# Patient Record
Sex: Female | Born: 1980 | Race: White | Hispanic: No | Marital: Married | Smoking: Former smoker
Health system: Southern US, Community
[De-identification: ages and names within clinical notes are randomized; demographics above are authoritative.]

## PROBLEM LIST (undated history)

## (undated) DIAGNOSIS — E119 Type 2 diabetes mellitus without complications: Secondary | ICD-10-CM

## (undated) DIAGNOSIS — S86019A Strain of unspecified Achilles tendon, initial encounter: Secondary | ICD-10-CM

## (undated) DIAGNOSIS — Z6841 Body Mass Index (BMI) 40.0 and over, adult: Secondary | ICD-10-CM

## (undated) DIAGNOSIS — E782 Mixed hyperlipidemia: Secondary | ICD-10-CM

## (undated) DIAGNOSIS — F32A Depression, unspecified: Secondary | ICD-10-CM

## (undated) DIAGNOSIS — K219 Gastro-esophageal reflux disease without esophagitis: Secondary | ICD-10-CM

## (undated) DIAGNOSIS — Z8759 Personal history of other complications of pregnancy, childbirth and the puerperium: Secondary | ICD-10-CM

## (undated) DIAGNOSIS — F419 Anxiety disorder, unspecified: Secondary | ICD-10-CM

## (undated) DIAGNOSIS — F329 Major depressive disorder, single episode, unspecified: Secondary | ICD-10-CM

## (undated) HISTORY — DX: Depression, unspecified: F32.A

## (undated) HISTORY — DX: Type 2 diabetes mellitus without complications: E11.9

## (undated) HISTORY — DX: Gastro-esophageal reflux disease without esophagitis: K21.9

## (undated) HISTORY — PX: WISDOM TOOTH EXTRACTION: SHX21

## (undated) HISTORY — DX: Personal history of other complications of pregnancy, childbirth and the puerperium: Z87.59

---
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
## 2003-12-22 HISTORY — PX: CERVICAL BIOPSY  W/ LOOP ELECTRODE EXCISION: SUR135

## 2009-01-21 HISTORY — PX: OTHER SURGICAL HISTORY: SHX169

## 2013-11-15 DIAGNOSIS — R87612 Low grade squamous intraepithelial lesion on cytologic smear of cervix (LGSIL): Secondary | ICD-10-CM | POA: Insufficient documentation

## 2013-11-15 HISTORY — DX: Low grade squamous intraepithelial lesion on cytologic smear of cervix (LGSIL): R87.612

## 2018-11-16 ENCOUNTER — Other Ambulatory Visit: Payer: Self-pay

## 2018-11-18 ENCOUNTER — Encounter: Payer: Self-pay | Admitting: Internal Medicine

## 2018-11-18 ENCOUNTER — Ambulatory Visit (INDEPENDENT_AMBULATORY_CARE_PROVIDER_SITE_OTHER): Payer: No Typology Code available for payment source | Admitting: Internal Medicine

## 2018-11-18 ENCOUNTER — Other Ambulatory Visit: Payer: Self-pay

## 2018-11-18 VITALS — BP 124/86 | HR 101 | Temp 97.6°F | Resp 16 | Ht 62.0 in | Wt 278.0 lb

## 2018-11-18 DIAGNOSIS — F339 Major depressive disorder, recurrent, unspecified: Secondary | ICD-10-CM | POA: Diagnosis not present

## 2018-11-18 DIAGNOSIS — R635 Abnormal weight gain: Secondary | ICD-10-CM

## 2018-11-18 DIAGNOSIS — Z23 Encounter for immunization: Secondary | ICD-10-CM

## 2018-11-18 DIAGNOSIS — R1310 Dysphagia, unspecified: Secondary | ICD-10-CM

## 2018-11-18 DIAGNOSIS — F5102 Adjustment insomnia: Secondary | ICD-10-CM

## 2018-11-18 DIAGNOSIS — E119 Type 2 diabetes mellitus without complications: Secondary | ICD-10-CM

## 2018-11-18 DIAGNOSIS — R1319 Other dysphagia: Secondary | ICD-10-CM

## 2018-11-18 DIAGNOSIS — R5383 Other fatigue: Secondary | ICD-10-CM

## 2018-11-18 MED ORDER — BUPROPION HCL ER (XL) 150 MG PO TB24: 150.0000 mg | ORAL_TABLET | Freq: Every day | ORAL | 2 refills | Status: DC

## 2018-11-18 MED ORDER — TRAZODONE HCL 50 MG PO TABS: 25.0000 mg | ORAL_TABLET | Freq: Every evening | ORAL | 3 refills | Status: DC | PRN

## 2018-11-18 MED ORDER — OMEPRAZOLE 20 MG PO CPDR: 20.0000 mg | DELAYED_RELEASE_CAPSULE | Freq: Every day | ORAL | 3 refills | Status: DC

## 2018-11-18 NOTE — Progress Notes (Addendum)
Subjective:  Patient ID: Patricia Mcbride, female    DOB: 08-22-1980  Age: 38 y.o. MRN: [id number]  CC: The primary encounter diagnosis was Weight gain. Diagnoses of Fatigue, unspecified type, Esophageal dysphagia, Need for immunization against influenza, Major depressive disorder, recurrent episode with anxious distress (HCC), Insomnia due to psychological stress, Morbid obesity (HCC), and Diabetes mellitus without complication (HCC) were also pertinent to this visit.  HPI Patricia Mcbride presents for establishment of care.  Positive depression screen:  Overating, anhedonia, not sleeping well.  Excessive Fatigue. Not suicidal .  Major life changes taken on simultaneously have created stress and she is having  trouble concentrating   PAPs done annually  and have been normal for ten years Jan 2019   No prior Mammogram   2) obesity: Smoothie made with oj and light yogurt pbj and chps for  Lunch  Snacks on nuts  10-15 lb weight loss at best with gym attendance    3)  Dysphagia  For the  past 6 - 12 months .  History of heart burn treated with zantac for 2 years   History Patricia Mcbride has a past medical history of Depression, GERD (gastroesophageal reflux disease), and Jaundice of newborn.   She has no past surgical history on file.   Her family history includes Asthma in her maternal grandmother and mother; Cancer in her father; Depression in her father, mother, sister, and sister; Diabetes in her father, maternal grandmother, and mother; Hearing loss in her maternal grandmother and sister; Heart disease in her maternal grandmother, paternal grandfather, and paternal grandmother; Hyperlipidemia in her father, maternal grandmother, mother, paternal grandfather, and paternal grandmother; Hypertension in her father, maternal grandmother, mother, paternal grandfather, paternal grandmother, and sister; Kidney disease in her maternal grandmother, paternal grandfather, and paternal grandmother;  Miscarriages / [place] in her sister.She reports that she has quit smoking. She has never used smokeless tobacco. She reports current alcohol use. She reports that she does not use drugs.  No outpatient medications prior to visit.   No facility-administered medications prior to visit.     Review of Systems:  Patient denies headache, fevers, malaise, unintentional weight loss, skin rash, eye pain, sinus congestion and sinus pain, sore throat, dysphagia,  hemoptysis , cough, dyspnea, wheezing, chest pain, palpitations, orthopnea, edema, abdominal pain, nausea, melena, diarrhea, constipation, flank pain, dysuria, hematuria, urinary  Frequency, nocturia, numbness, tingling, seizures,  Focal weakness, Loss of consciousness,  Tremor, insomnia, depression, anxiety, and suicidal ideation.     Objective:  BP 124/86 (BP Location: Left Arm, Patient Position: Sitting, Cuff Size: Large)   Pulse (!) 101   Temp 97.6 F (36.4 C) (Temporal)   Resp 16   Ht 5\' 2"  (1.575 m)   Wt 278 lb (126.1 kg)   SpO2 97%   BMI 50.85 kg/m   Physical Exam:  General appearance: alert, cooperative and appears stated age Ears: normal TM's and external ear canals both ears Throat: lips, mucosa, and tongue normal; teeth and gums normal Neck: no adenopathy, no carotid bruit, supple, symmetrical, trachea midline and thyroid not enlarged, symmetric, no tenderness/mass/nodules Back: symmetric, no curvature. ROM normal. No CVA tenderness. Lungs: clear to auscultation bilaterally Heart: regular rate and rhythm, S1, S2 normal, no murmur, click, rub or gallop Abdomen: soft, non-tender; bowel sounds normal; no masses,  no organomegaly Pulses: 2+ and symmetric Skin: Skin color, texture, turgor normal. No rashes or lesions Lymph nodes: Cervical, supraclavicular, and axillary nodes normal. Psych: affect normal, extroverted .makes good eye contact. No  fidgeting,  Smiles easily.  Denies suicidal thoughts    Assessment & Plan:    Problem List Items Addressed This Visit      Unprioritized   Diabetes mellitus without complication (HCC)    Recent fasting  glucose is elevated and  diagnostic of diabetes.  Based on her  A1c,   SHE DOES NOT need medications at this time.  She was encouraged to schedule a follow up visit to discuss diagnosis and lifestyle changes including low glycemic index diet , weight loss (goal BMI < 30) , participate regularly in aerobic  Exercise.      Dysphagia    Secondary to longstanding GERD.  Barium swallow and GI referral advised.  Omeprazole prescribed.       Relevant Orders   DG ESOPHAGUS W SINGLE CM (SOL OR THIN BA)   Ambulatory referral to Gastroenterology   Major depressive disorder, recurrent episode with anxious distress (HCC)    Trial of wellbutrin , with trazodone added for insomnia follow up 2-3 weeks       Relevant Medications   buPROPion (WELLBUTRIN XL) 150 MG 24 hr tablet   traZODone (DESYREL) 50 MG tablet   Insomnia due to psychological stress    Adding trazodone       Morbid obesity (HCC)    I have addressed  BMI and recommended a low glycemic index diet utilizing smaller more frequent meals to increase metabolism.  I have also recommended that patient start exercising with a goal of 30 minutes of aerobic exercise a minimum of 5 days per week. Screening for lipid disorders, thyroid and diabetes to be done today.         Other Visit Diagnoses    Weight gain    -  Primary   Relevant Orders   TSH (Completed)   Comprehensive metabolic panel (Completed)   Hemoglobin A1c (Completed)   Lipid panel (Completed)   Fatigue, unspecified type       Relevant Orders   CBC with Differential/Platelet (Completed)   Need for immunization against influenza       Relevant Orders   Flu Vaccine QUAD 36+ mos IM (Completed)      I am having Patricia Mcbride start on buPROPion, traZODone, and omeprazole.  Meds ordered this encounter  Medications  . buPROPion (WELLBUTRIN XL)  150 MG 24 hr tablet    Sig: Take 1 tablet (150 mg total) by mouth daily.    Dispense:  30 tablet    Refill:  2  . traZODone (DESYREL) 50 MG tablet    Sig: Take 0.5-1 tablets (25-50 mg total) by mouth at bedtime as needed for sleep.    Dispense:  30 tablet    Refill:  3  . omeprazole (PRILOSEC) 20 MG capsule    Sig: Take 1 capsule (20 mg total) by mouth daily.    Dispense:  30 capsule    Refill:  3    There are no discontinued medications.  Follow-up: Return in about 4 weeks (around 12/16/2018).   Sherlene Shams, MD

## 2018-11-18 NOTE — Patient Instructions (Signed)
Wellbutrin once daily for the depression.  Take with breakfast  Trazodone at bedtime start with 1/2 tablet  Omeprazole once daily on an [place]:  1) SWALLOW EVALUATION 2)  Gi REFERRAL FOR AN ENDOSCOPY    Dysphagia  Dysphagia is trouble swallowing. This condition occurs when solids and liquids stick in a person's throat on the way down to the stomach, or when food takes longer to get to the stomach. You may have problems swallowing food, liquids, or both. You may also have pain while trying to swallow. It may take you more time and effort to swallow something. What are the causes? This condition is caused by:  Problems with the muscles. They may make it difficult for you to move food and liquids through the tube that connects your mouth to your stomach (esophagus). You may have ulcers, scar tissue, or inflammation that blocks the normal passage of food and liquids. Causes of these problems include: ? Acid reflux from your stomach into your esophagus (gastroesophageal reflux). ? Infections. ? Radiation treatment for cancer. ? Medicines taken without enough fluids to wash them down into your stomach.  Nerve problems. These prevent signals from being sent to the muscles of your esophagus to squeeze (contract) and move what you swallow down to your stomach.  Globus pharyngeus. This is a common problem that involves feeling like something is stuck in the throat or a sense of trouble with swallowing even though nothing is wrong with the swallowing passages.  Stroke. This can affect the nerves and make it difficult to swallow.  Certain conditions, such as cerebral palsy or Parkinson disease. What are the signs or symptoms? Common symptoms of this condition include:  A feeling that solids or liquids are stuck in your throat on the way down to the stomach.  Food taking too long to get to the stomach. Other symptoms include:  Food moving back from  your stomach to your mouth (regurgitation).  Noises coming from your throat.  Chest discomfort with swallowing.  A feeling of fullness when swallowing.  Drooling, especially when the throat is blocked.  Pain while swallowing.  Heartburn.  Coughing or gagging while trying to swallow. How is this diagnosed? This condition is diagnosed by:  Barium X-ray. In this test, you swallow a white substance (contrast medium)that sticks to the inside of your esophagus. X-ray images are then taken.  Endoscopy. In this test, a flexible telescope is inserted down your throat to look at your esophagus and your stomach.  CT scans and MRI. How is this treated? Treatment for dysphagia depends on the cause of the condition:  If the dysphagia is caused by acid reflux or infection, medicines may be used. They may include antibiotics and heartburn medicines.  If the dysphagia is caused by problems with your muscles, swallowing therapy may be used to help you strengthen your swallowing muscles. You may have to do specific exercises to strengthen the muscles or stretch them.  If the dysphagia is caused by a blockage or mass, procedures to remove the blockage may be done. You may need surgery and a feeding tube. You may need to make diet changes. Ask your health care provider for specific instructions. Follow these instructions at home: Eating and drinking  Try to eat soft food that is easier to swallow.  Follow any diet changes as told by your health care provider.  Cut your food into small pieces and eat slowly.  Eat and drink only  when you are sitting upright.  Do not drink alcohol or caffeine. If you need help quitting, ask your health care provider. General instructions  Check your weight every day to make sure you are not losing weight.  Take over-the-counter and prescription medicines only as told by your health care provider.  If you were prescribed an antibiotic medicine, take it as  told by your health care provider. Do not stop taking the antibiotic even if you start to feel better.  Do not use any products that contain nicotine or tobacco, such as cigarettes and e-cigarettes. If you need help quitting, ask your health care provider.  Keep all follow-up visits as told by your health care provider. This is important. Contact a health care provider if:  You lose weight because you cannot swallow.  You cough when you drink liquids (aspiration).  You cough up partially digested food. Get help right away if:  You cannot swallow your saliva.  You have shortness of breath or a fever, or both.  You have a hoarse voice and also have trouble swallowing. Summary  Dysphagia is trouble swallowing. This condition occurs when solids and liquids stick in a person's throat on the way down to the stomach, or when food takes longer to get to the stomach.  Dysphagia has many possible causes and symptoms.  Treatment for dysphagia depends on the cause of the condition. This information is not intended to replace advice given to you by your health care provider. Make sure you discuss any questions you have with your health care provider. Document Released: 01/05/2000 Document Revised: 12/20/2016 Document Reviewed: 12/28/2015 Elsevier Patient Education  2020 [employer].

## 2018-11-19 DIAGNOSIS — R1310 Dysphagia, unspecified: Secondary | ICD-10-CM | POA: Insufficient documentation

## 2018-11-19 DIAGNOSIS — F339 Major depressive disorder, recurrent, unspecified: Secondary | ICD-10-CM | POA: Insufficient documentation

## 2018-11-19 DIAGNOSIS — F5102 Adjustment insomnia: Secondary | ICD-10-CM | POA: Insufficient documentation

## 2018-11-19 LAB — CBC WITH DIFFERENTIAL/PLATELET
Basophils Absolute: 0.1 10*3/uL (ref 0.0–0.1)
Basophils Relative: 0.6 % (ref 0.0–3.0)
Eosinophils Absolute: 0.6 10*3/uL (ref 0.0–0.7)
Eosinophils Relative: 4.6 % (ref 0.0–5.0)
HCT: 40.6 % (ref 36.0–46.0)
Hemoglobin: 13.4 g/dL (ref 12.0–15.0)
Lymphocytes Relative: 29.1 % (ref 12.0–46.0)
Lymphs Abs: 3.7 10*3/uL (ref 0.7–4.0)
MCHC: 33 g/dL (ref 30.0–36.0)
MCV: 89.6 fl (ref 78.0–100.0)
Monocytes Absolute: 0.9 10*3/uL (ref 0.1–1.0)
Monocytes Relative: 7 % (ref 3.0–12.0)
Neutro Abs: 7.4 10*3/uL (ref 1.4–7.7)
Neutrophils Relative %: 58.7 % (ref 43.0–77.0)
Platelets: 355 10*3/uL (ref 150.0–400.0)
RBC: 4.53 Mil/uL (ref 3.87–5.11)
RDW: 13.3 % (ref 11.5–15.5)
WBC: 12.7 10*3/uL — ABNORMAL HIGH (ref 4.0–10.5)

## 2018-11-19 LAB — COMPREHENSIVE METABOLIC PANEL
ALT: 17 U/L (ref 0–35)
AST: 13 U/L (ref 0–37)
Albumin: 4.4 g/dL (ref 3.5–5.2)
Alkaline Phosphatase: 92 U/L (ref 39–117)
BUN: 9 mg/dL (ref 6–23)
CO2: 30 mEq/L (ref 19–32)
Calcium: 9.8 mg/dL (ref 8.4–10.5)
Chloride: 101 mEq/L (ref 96–112)
Creatinine, Ser: 0.78 mg/dL (ref 0.40–1.20)
GFR: 83.63 mL/min (ref >60.00)
Glucose, Bld: 131 mg/dL — ABNORMAL HIGH (ref 70–99)
Potassium: 4.1 mEq/L (ref 3.5–5.1)
Sodium: 137 mEq/L (ref 135–145)
Total Bilirubin: 0.4 mg/dL (ref 0.2–1.2)
Total Protein: 7.4 g/dL (ref 6.0–8.3)

## 2018-11-19 LAB — LDL CHOLESTEROL, DIRECT: Direct LDL: 141 mg/dL

## 2018-11-19 LAB — HEMOGLOBIN A1C: Hgb A1c MFr Bld: 6.5 % (ref 4.6–6.5)

## 2018-11-19 LAB — LIPID PANEL
Cholesterol: 196 mg/dL (ref 0–200)
HDL: 31.9 mg/dL — ABNORMAL LOW (ref >39.00)
NonHDL: 163.6
Total CHOL/HDL Ratio: 6
Triglycerides: 201 mg/dL — ABNORMAL HIGH (ref 0.0–149.0)
VLDL: 40.2 mg/dL — ABNORMAL HIGH (ref 0.0–40.0)

## 2018-11-19 NOTE — Assessment & Plan Note (Signed)
Secondary to longstanding GERD.  Barium swallow and GI referral advised.  Omeprazole prescribed.

## 2018-11-19 NOTE — Assessment & Plan Note (Signed)
Trial of wellbutrin , with trazodone added for insomnia follow up 2-3 weeks

## 2018-11-19 NOTE — Assessment & Plan Note (Signed)
Adding trazodone

## 2018-11-19 NOTE — Assessment & Plan Note (Signed)
I have addressed  BMI and recommended a low glycemic index diet utilizing smaller more frequent meals to increase metabolism.  I have also recommended that patient start exercising with a goal of 30 minutes of aerobic exercise a minimum of 5 days per week. Screening for lipid disorders, thyroid and diabetes to be done today.   

## 2018-11-20 LAB — TSH: TSH: 1.43 u[IU]/mL (ref 0.35–4.50)

## 2018-11-22 DIAGNOSIS — E119 Type 2 diabetes mellitus without complications: Secondary | ICD-10-CM | POA: Insufficient documentation

## 2018-11-22 NOTE — Assessment & Plan Note (Addendum)
her fasting  glucose is elevated and  diagnostic of diabetes.  Based on her  A1c,   SHE DOES NOT need medications at this time.  She was encouraged to schedule a follow up visit to discuss diagnosis and lifestyle changes including low glycemic index diet , weight loss (goal BMI < 30) , participate regularly in aerobic  Exercise.

## 2018-11-27 ENCOUNTER — Telehealth: Payer: Self-pay | Admitting: Internal Medicine

## 2018-11-27 NOTE — Telephone Encounter (Signed)
It needs to be done BEFORE she sees GI .  Thank you

## 2018-11-27 NOTE — Telephone Encounter (Signed)
I called pt to schedule the DG esophagus, pt is scheduled to see the GI in December. Do you want pt to have the DG done before the appt? Or can she wait until she sees the GI? Please advise and Thank you!

## 2018-12-04 ENCOUNTER — Other Ambulatory Visit: Payer: Self-pay | Admitting: Internal Medicine

## 2018-12-04 ENCOUNTER — Ambulatory Visit
Admission: RE | Admit: 2018-12-04 | Discharge: 2018-12-04 | Disposition: A | Discharge status: Home / Self Care | Payer: No Typology Code available for payment source | Source: Ambulatory Visit | Attending: Internal Medicine | Admitting: Internal Medicine

## 2018-12-04 ENCOUNTER — Other Ambulatory Visit: Payer: Self-pay

## 2018-12-04 DIAGNOSIS — R1310 Dysphagia, unspecified: Secondary | ICD-10-CM

## 2018-12-04 DIAGNOSIS — R1319 Other dysphagia: Secondary | ICD-10-CM

## 2018-12-06 ENCOUNTER — Telehealth: Payer: Self-pay | Admitting: Internal Medicine

## 2018-12-06 DIAGNOSIS — R1319 Other dysphagia: Secondary | ICD-10-CM

## 2018-12-06 DIAGNOSIS — R1310 Dysphagia, unspecified: Secondary | ICD-10-CM

## 2018-12-06 NOTE — Telephone Encounter (Signed)
Her Barium swallow showed normal swallow function, no obvious stricture or mechanical problems.  I would Proceed with GI evaluation

## 2018-12-07 NOTE — Telephone Encounter (Signed)
Spoke with pt to let her know of the results and pt is okay with going forward with the GI evaluation.

## 2018-12-07 NOTE — Telephone Encounter (Signed)
Referral in process

## 2018-12-14 DIAGNOSIS — F32A Depression, unspecified: Secondary | ICD-10-CM | POA: Insufficient documentation

## 2018-12-31 ENCOUNTER — Other Ambulatory Visit: Payer: Self-pay

## 2018-12-31 ENCOUNTER — Encounter: Payer: Self-pay | Admitting: Gastroenterology

## 2018-12-31 ENCOUNTER — Ambulatory Visit: Payer: No Typology Code available for payment source | Admitting: Gastroenterology

## 2018-12-31 ENCOUNTER — Ambulatory Visit (INDEPENDENT_AMBULATORY_CARE_PROVIDER_SITE_OTHER): Payer: No Typology Code available for payment source | Admitting: Gastroenterology

## 2018-12-31 VITALS — BP 121/78 | HR 91 | Temp 98.8°F | Ht 62.0 in | Wt 272.2 lb

## 2018-12-31 DIAGNOSIS — R1310 Dysphagia, unspecified: Secondary | ICD-10-CM | POA: Diagnosis not present

## 2018-12-31 MED ORDER — PANTOPRAZOLE SODIUM 40 MG PO TBEC: 40.0000 mg | DELAYED_RELEASE_TABLET | Freq: Every day | ORAL | 3 refills | Status: DC

## 2018-12-31 NOTE — Progress Notes (Signed)
  Gastroenterology Consultation  Referring Provider:     Tullo, Teresa L, MD Primary Care Physician:  Tullo, Teresa L, MD Primary Gastroenterologist:  Dr. Betsie Peckman     Reason for Consultation:     Dysphagia        HPI:   Patricia Mcbride is a 38 y.o. y/o female referred for consultation & management of dysphagia by Dr. Tullo, Teresa L, MD.  This patient comes in with a report of dysphagia.  The patient states that her dysphagia is mostly to solids.  She has been having problems with bread, beef, chicken and pork.  She denies any unexplained weight loss.  She does state that this has been going on for some time and has not had any rectal bleeding or change in bowel habits.  The patient denies any family history of any upper GI cancers.  She reports that she was started on omeprazole and takes it usually an hour before she eats and has had some relief but continues to have dysphagia.  Past Medical History:  Diagnosis Date  . Depression   . GERD (gastroesophageal reflux disease)   . Jaundice of newborn     History reviewed. No pertinent surgical history.  Prior to Admission medications   Medication Sig Start Date End Date Taking? Authorizing Provider  buPROPion (WELLBUTRIN XL) 150 MG 24 hr tablet Take 1 tablet (150 mg total) by mouth daily. 11/18/18  Yes Tullo, Teresa L, MD  omeprazole (PRILOSEC) 20 MG capsule Take 1 capsule (20 mg total) by mouth daily. 11/18/18  Yes Tullo, Teresa L, MD  traZODone (DESYREL) 50 MG tablet Take 0.5-1 tablets (25-50 mg total) by mouth at bedtime as needed for sleep. Patient not taking: Reported on 12/31/2018 11/18/18   Tullo, Teresa L, MD    Family History  Problem Relation Age of Onset  . Depression Mother   . Hypertension Mother   . Hyperlipidemia Mother   . Diabetes Mother   . Asthma Mother   . Depression Father   . Hypertension Father   . Cancer Father   . Diabetes Father   . Hyperlipidemia Father   . Depression Sister   . Hearing loss Sister     . Miscarriages / Stillbirths Sister   . Depression Sister   . Hypertension Sister   . Asthma Maternal Grandmother   . Diabetes Maternal Grandmother   . Hearing loss Maternal Grandmother   . Heart disease Maternal Grandmother   . Hyperlipidemia Maternal Grandmother   . Hypertension Maternal Grandmother   . Kidney disease Maternal Grandmother   . Kidney disease Paternal Grandmother   . Hypertension Paternal Grandmother   . Hyperlipidemia Paternal Grandmother   . Heart disease Paternal Grandmother   . Heart disease Paternal Grandfather   . Hyperlipidemia Paternal Grandfather   . Hypertension Paternal Grandfather   . Kidney disease Paternal Grandfather      Social History   Tobacco Use  . Smoking status: Former Smoker  . Smokeless tobacco: Never Used  Substance Use Topics  . Alcohol use: Yes  . Drug use: Never    Allergies as of 12/31/2018 - Review Complete 12/31/2018  Allergen Reaction Noted  . Morphine Nausea Only 07/15/2011    Review of Systems:    All systems reviewed and negative except where noted in HPI.   Physical Exam:  BP 121/78   Pulse 91   Temp 98.8 F (37.1 C) (Oral)   Ht 5' 2" (1.575 m)   Wt 272 lb   3.2 oz (123.5 kg)   BMI 49.79 kg/m  No LMP recorded. General:   Alert,  Well-developed, well-nourished, pleasant and cooperative in NAD Head:  Normocephalic and atraumatic. Eyes:  Sclera clear, no icterus.   Conjunctiva pink. Ears:  Normal auditory acuity. Neck:  Supple; no masses or thyromegaly. Lungs:  Respirations even and unlabored.  Clear throughout to auscultation.   No wheezes, crackles, or rhonchi. No acute distress. Heart:  Regular rate and rhythm; no murmurs, clicks, rubs, or gallops. Abdomen:  Normal bowel sounds.  No bruits.  Soft, non-tender and non-distended without masses, hepatosplenomegaly or hernias noted.  No guarding or rebound tenderness.  Negative Carnett sign.   Rectal:  Deferred.  Msk:  Symmetrical without gross deformities.   Good, equal movement & strength bilaterally. Pulses:  Normal pulses noted. Extremities:  No clubbing or edema.  No cyanosis. Neurologic:  Alert and oriented x3;  grossly normal neurologically. Skin:  Intact without significant lesions or rashes.  No jaundice. Lymph Nodes:  No significant cervical adenopathy. Psych:  Alert and cooperative. Normal mood and affect.  Imaging Studies: DG ESOPHAGUS W DOUBLE CM (HD)  Result Date: 12/04/2018 CLINICAL DATA:  Dysphagia EXAM: ESOPHOGRAM / BARIUM SWALLOW / BARIUM TABLET STUDY TECHNIQUE: Combined double contrast and single contrast examination performed using effervescent crystals, thick barium liquid, and thin barium liquid. The patient was observed with fluoroscopy swallowing a 13 mm barium sulphate tablet. FLUOROSCOPY TIME:  Fluoroscopy Time:  1:06 Number of Acquired Spot Images: 45 COMPARISON:  None. FINDINGS: Normal oropharyngeal phase of swallow. No evidence of penetration or aspiration. Normal contour and caliber of the esophagus. The gastroesophageal junction is patent. A swallowed 13 mm barium tablet passes readily. No spontaneous or provoked gastroesophageal reflux. Normal partial double contrast appearance of the stomach and proximal small bowel. IMPRESSION: Normal double contrast barium swallow examination. Electronically Signed   By: Alex  Bibbey M.D.   On: 12/04/2018 10:04    Assessment and Plan:   Patricia Mcbride is a 38 y.o. y/o female who comes in today with a history of dysphagia.  The patient had a esophagus barium swallow without any strictures or narrowing seen.  It was reported to be normal.  The patient has done better with a PPI but is not back to her normal self.  The patient will be set up for an upper endoscopy for possible eosinophilic esophagitis.  The patient has been reassured that since this has been going on for so long in the upper GI barium study did not show any masses or cancers that this is unlikely an abnormal growth or  cancer causing her symptoms.  The patient has been explained the plan and agrees with it.    [name], MD. FACG    Note: This dictation was prepared with Dragon dictation along with smaller phrase technology. Any transcriptional errors that result from this process are unintentional.   

## 2018-12-31 NOTE — H&P (View-Only) (Signed)
Gastroenterology Consultation  Referring Provider:     Crecencio Mc, MD Primary Care Physician:  Crecencio Mc, MD Primary Gastroenterologist:  Dr. Allen Norris     Reason for Consultation:     Dysphagia        HPI:   Patricia Mcbride is a 38 y.o. y/o female referred for consultation & management of dysphagia by Dr. Derrel Nip, Aris Everts, MD.  This patient comes in with a report of dysphagia.  The patient states that her dysphagia is mostly to solids.  She has been having problems with bread, beef, chicken and pork.  She denies any unexplained weight loss.  She does state that this has been going on for some time and has not had any rectal bleeding or change in bowel habits.  The patient denies any family history of any upper GI cancers.  She reports that she was started on omeprazole and takes it usually an hour before she eats and has had some relief but continues to have dysphagia.  Past Medical History:  Diagnosis Date  . Depression   . GERD (gastroesophageal reflux disease)   . Jaundice of newborn     History reviewed. No pertinent surgical history.  Prior to Admission medications   Medication Sig Start Date End Date Taking? Authorizing Provider  buPROPion (WELLBUTRIN XL) 150 MG 24 hr tablet Take 1 tablet (150 mg total) by mouth daily. 11/18/18  Yes Crecencio Mc, MD  omeprazole (PRILOSEC) 20 MG capsule Take 1 capsule (20 mg total) by mouth daily. 11/18/18  Yes Crecencio Mc, MD  traZODone (DESYREL) 50 MG tablet Take 0.5-1 tablets (25-50 mg total) by mouth at bedtime as needed for sleep. Patient not taking: Reported on 12/31/2018 11/18/18   Crecencio Mc, MD    Family History  Problem Relation Age of Onset  . Depression Mother   . Hypertension Mother   . Hyperlipidemia Mother   . Diabetes Mother   . Asthma Mother   . Depression Father   . Hypertension Father   . Cancer Father   . Diabetes Father   . Hyperlipidemia Father   . Depression Sister   . Hearing loss Sister     . Miscarriages / Stillbirths Sister   . Depression Sister   . Hypertension Sister   . Asthma Maternal Grandmother   . Diabetes Maternal Grandmother   . Hearing loss Maternal Grandmother   . Heart disease Maternal Grandmother   . Hyperlipidemia Maternal Grandmother   . Hypertension Maternal Grandmother   . Kidney disease Maternal Grandmother   . Kidney disease Paternal Grandmother   . Hypertension Paternal Grandmother   . Hyperlipidemia Paternal Grandmother   . Heart disease Paternal Grandmother   . Heart disease Paternal Grandfather   . Hyperlipidemia Paternal Grandfather   . Hypertension Paternal Grandfather   . Kidney disease Paternal Grandfather      Social History   Tobacco Use  . Smoking status: Former Research scientist (life sciences)  . Smokeless tobacco: Never Used  Substance Use Topics  . Alcohol use: Yes  . Drug use: Never    Allergies as of 12/31/2018 - Review Complete 12/31/2018  Allergen Reaction Noted  . Morphine Nausea Only 07/15/2011    Review of Systems:    All systems reviewed and negative except where noted in HPI.   Physical Exam:  BP 121/78   Pulse 91   Temp 98.8 F (37.1 C) (Oral)   Ht 5\' 2"  (1.575 m)   Wt 272 lb  3.2 oz (123.5 kg)   BMI 49.79 kg/m  No LMP recorded. General:   Alert,  Well-developed, well-nourished, pleasant and cooperative in NAD Head:  Normocephalic and atraumatic. Eyes:  Sclera clear, no icterus.   Conjunctiva pink. Ears:  Normal auditory acuity. Neck:  Supple; no masses or thyromegaly. Lungs:  Respirations even and unlabored.  Clear throughout to auscultation.   No wheezes, crackles, or rhonchi. No acute distress. Heart:  Regular rate and rhythm; no murmurs, clicks, rubs, or gallops. Abdomen:  Normal bowel sounds.  No bruits.  Soft, non-tender and non-distended without masses, hepatosplenomegaly or hernias noted.  No guarding or rebound tenderness.  Negative Carnett sign.   Rectal:  Deferred.  Msk:  Symmetrical without gross deformities.   Good, equal movement & strength bilaterally. Pulses:  Normal pulses noted. Extremities:  No clubbing or edema.  No cyanosis. Neurologic:  Alert and oriented x3;  grossly normal neurologically. Skin:  Intact without significant lesions or rashes.  No jaundice. Lymph Nodes:  No significant cervical adenopathy. Psych:  Alert and cooperative. Normal mood and affect.  Imaging Studies: DG ESOPHAGUS W DOUBLE CM (HD)  Result Date: 12/04/2018 CLINICAL DATA:  Dysphagia EXAM: ESOPHOGRAM / BARIUM SWALLOW / BARIUM TABLET STUDY TECHNIQUE: Combined double contrast and single contrast examination performed using effervescent crystals, thick barium liquid, and thin barium liquid. The patient was observed with fluoroscopy swallowing a 13 mm barium sulphate tablet. FLUOROSCOPY TIME:  Fluoroscopy Time:  1:06 Number of Acquired Spot Images: 45 COMPARISON:  None. FINDINGS: Normal oropharyngeal phase of swallow. No evidence of penetration or aspiration. Normal contour and caliber of the esophagus. The gastroesophageal junction is patent. A swallowed 13 mm barium tablet passes readily. No spontaneous or provoked gastroesophageal reflux. Normal partial double contrast appearance of the stomach and proximal small bowel. IMPRESSION: Normal double contrast barium swallow examination. Electronically Signed   By: Lauralyn Primes M.D.   On: 12/04/2018 10:04    Assessment and Plan:   Patricia Mcbride is a 37 y.o. y/o female who comes in today with a history of dysphagia.  The patient had a esophagus barium swallow without any strictures or narrowing seen.  It was reported to be normal.  The patient has done better with a PPI but is not back to her normal self.  The patient will be set up for an upper endoscopy for possible eosinophilic esophagitis.  The patient has been reassured that since this has been going on for so long in the upper GI barium study did not show any masses or cancers that this is unlikely an abnormal growth or  cancer causing her symptoms.  The patient has been explained the plan and agrees with it.    Midge Minium, MD. Clementeen Graham    Note: This dictation was prepared with Dragon dictation along with smaller phrase technology. Any transcriptional errors that result from this process are unintentional.

## 2019-01-05 ENCOUNTER — Other Ambulatory Visit: Payer: Self-pay

## 2019-01-05 ENCOUNTER — Encounter: Payer: Self-pay | Admitting: Internal Medicine

## 2019-01-05 ENCOUNTER — Ambulatory Visit (INDEPENDENT_AMBULATORY_CARE_PROVIDER_SITE_OTHER): Payer: No Typology Code available for payment source | Admitting: Internal Medicine

## 2019-01-05 VITALS — BP 121/78 | Ht 62.0 in | Wt 270.0 lb

## 2019-01-05 DIAGNOSIS — R1310 Dysphagia, unspecified: Secondary | ICD-10-CM | POA: Diagnosis not present

## 2019-01-05 DIAGNOSIS — E119 Type 2 diabetes mellitus without complications: Secondary | ICD-10-CM

## 2019-01-05 DIAGNOSIS — F339 Major depressive disorder, recurrent, unspecified: Secondary | ICD-10-CM | POA: Diagnosis not present

## 2019-01-05 DIAGNOSIS — R1319 Other dysphagia: Secondary | ICD-10-CM

## 2019-01-05 MED ORDER — CITALOPRAM HYDROBROMIDE 20 MG PO TABS: 20.0000 mg | ORAL_TABLET | Freq: Every day | ORAL | 3 refills | Status: DC

## 2019-01-05 NOTE — Patient Instructions (Signed)
Continue wellbutrin daily for one week  Start the citalopram at 1/2 tablet dailY with or after dinner  Week 2:   Decrease the wellbutrin to every other day  Increase the citalopram to full tablet daily after dinner    Limit your carbs to 60 daily.     30 minutes of exercise 5 days per week is your MINIMUM GOAL  RETURN IN 3 MONTHS

## 2019-01-05 NOTE — Assessment & Plan Note (Signed)
wellbutrin not tolerated secondary to decreased sex drive and dry mouth .  changing to citalopram

## 2019-01-05 NOTE — Assessment & Plan Note (Signed)
Recent fasting  glucose is elevated and  diagnostic of diabetes.  Based on her  A1c,   SHE DOES NOT need medications at this time.  She was encouraged to schedule an annual eye exam and to make  lifestyle changes including low glycemic index diet , weight loss (goal BMI < 30) , participate regularly in aerobic  Exercise, and to follow up in 3 months.

## 2019-01-05 NOTE — Progress Notes (Signed)
Virtual Visit converted to telephone   This visit type was conducted due to national recommendations for restrictions regarding the COVID-19 pandemic (e.g. social distancing).  This format is felt to be most appropriate for this patient at this time.  All issues noted in this document were discussed and addressed.  No physical exam was performed (except for noted visual exam findings with Video Visits).   I attempted to connect  with@ on 01/05/19 at  2:30 PM EST by a video enabled telemedicine application .   Interactive audio and video telecommunications were initially established beteen this provider and patient, however ultimately failed, due to patient having technical difficulties. We continued and completed visit with audio only  and verified that I am speaking with the correct person using two identifiers Location patient: home Location provider: work or home office Persons participating in the virtual visit: patient, provider  I discussed the limitations, risks, security and privacy concerns of performing an evaluation and management service by telephone and the availability of in person appointments. I also discussed with the patient that there may be a patient responsible charge related to this service. The patient expressed understanding and agreed to proceed.  Reason for visit: follow up on multiple issues    HPI: 1) Seen as new patient in October and diagnosed  With Type 2 DM  Lab Results  Component Value Date   HGBA1C 6.5 11/18/2018    diabetes   DIET DISCUSSED IN DETAIL.   EXERCISE HABItS IN DAETAIL Has had a very difficult time losing weight.  Max 10 lbs.  DRINKING A HOME MADE SMOOTHIE DAILYMADE  WITH YOGURT,  JUICE AND FRUIT including bananas. .  Eats 2 full meals daily,  snacks on nuts,  Crackers and pretzels.   2) Depression/fatigue: NOT TOLERATING WELLBUTRIN.  NO SEX DRIVE ,   DRY MOUTH. Not using  Trazodone.   3) Dysphagia:  Normal barium swallow. She is scheduled  for EGD on Dec 29    ROS: See pertinent positives and negatives per HPI.  Past Medical History:  Diagnosis Date  . Depression   . GERD (gastroesophageal reflux disease)   . Jaundice of newborn     No past surgical history on file.  Family History  Problem Relation Age of Onset  . Depression Mother   . Hypertension Mother   . Hyperlipidemia Mother   . Diabetes Mother   . Asthma Mother   . Depression Father   . Hypertension Father   . Cancer Father   . Diabetes Father   . Hyperlipidemia Father   . Depression Sister   . Hearing loss Sister   . Miscarriages / Stillbirths Sister   . Depression Sister   . Hypertension Sister   . Asthma Maternal Grandmother   . Diabetes Maternal Grandmother   . Hearing loss Maternal Grandmother   . Heart disease Maternal Grandmother   . Hyperlipidemia Maternal Grandmother   . Hypertension Maternal Grandmother   . Kidney disease Maternal Grandmother   . Kidney disease Paternal Grandmother   . Hypertension Paternal Grandmother   . Hyperlipidemia Paternal Grandmother   . Heart disease Paternal Grandmother   . Heart disease Paternal Grandfather   . Hyperlipidemia Paternal Grandfather   . Hypertension Paternal Grandfather   . Kidney disease Paternal Grandfather     SOCIAL HX:  reports that she has quit smoking. She has never used smokeless tobacco. She reports current alcohol use. She reports that she does not use drugs.   Current  Outpatient Medications:  .  buPROPion (WELLBUTRIN XL) 150 MG 24 hr tablet, Take 1 tablet (150 mg total) by mouth daily., Disp: 30 tablet, Rfl: 2 .  pantoprazole (PROTONIX) 40 MG tablet, Take 1 tablet (40 mg total) by mouth daily., Disp: 30 tablet, Rfl: 3 .  traZODone (DESYREL) 50 MG tablet, Take 0.5-1 tablets (25-50 mg total) by mouth at bedtime as needed for sleep., Disp: 30 tablet, Rfl: 3 .  citalopram (CELEXA) 20 MG tablet, Take 1 tablet (20 mg total) by mouth daily., Disp: 30 tablet, Rfl: 3  EXAM:  VITALS  per patient if applicable:  GENERAL: alert, oriented, appears well and in no acute distress  HEENT: atraumatic, conjunttiva clear, no obvious abnormalities on inspection of external nose and ears  NECK: normal movements of the head and neck  LUNGS: on inspection no signs of respiratory distress, breathing rate appears normal, no obvious gross SOB, gasping or wheezing  CV: no obvious cyanosis  MS: moves all visible extremities without noticeable abnormality  PSYCH/NEURO: pleasant and cooperative, no obvious depression or anxiety, speech and thought processing grossly intact  ASSESSMENT AND PLAN:  Discussed the following assessment and plan:  Diabetes mellitus without complication (HCC) - Plan: Hemoglobin A1c, Comprehensive metabolic panel, Microalbumin / creatinine urine ratio  Esophageal dysphagia  Major depressive disorder, recurrent episode with anxious distress (HCC)  Dysphagia Normal barium swallow.  For EGD next week.   Diabetes mellitus without complication ([place]) Recent fasting  glucose is elevated and  diagnostic of diabetes.  Based on her  A1c,   SHE DOES NOT need medications at this time.  She was encouraged to schedule an annual eye exam and to make  lifestyle changes including low glycemic index diet , weight loss (goal BMI < 30) , participate regularly in aerobic  Exercise, and to follow up in 3 months.  Major depressive disorder, recurrent episode with anxious distress ([place]) wellbutrin not tolerated secondary to decreased sex drive and dry mouth .  changing to citalopram     I discussed the assessment and treatment plan with the patient. The patient was provided an opportunity to ask questions and all were answered. The patient agreed with the plan and demonstrated an understanding of the instructions.    A total of 40 minutes was spent with patient more than half of which was spent in counseling patient on the above mentioned issues , reviewing and explaining  recent labs and imaging studies done, and coordination of care. Crecencio Mc, MD

## 2019-01-05 NOTE — Assessment & Plan Note (Signed)
Normal barium swallow.  For EGD next week.

## 2019-01-14 ENCOUNTER — Other Ambulatory Visit: Payer: Self-pay

## 2019-01-14 ENCOUNTER — Other Ambulatory Visit
Admission: RE | Admit: 2019-01-14 | Discharge: 2019-01-14 | Disposition: A | Discharge status: Home / Self Care | Payer: No Typology Code available for payment source | Source: Ambulatory Visit | Attending: Gastroenterology | Admitting: Gastroenterology

## 2019-01-14 DIAGNOSIS — Z01812 Encounter for preprocedural laboratory examination: Secondary | ICD-10-CM | POA: Insufficient documentation

## 2019-01-14 DIAGNOSIS — Z20828 Contact with and (suspected) exposure to other viral communicable diseases: Secondary | ICD-10-CM | POA: Insufficient documentation

## 2019-01-14 LAB — SARS CORONAVIRUS 2 (TAT 6-24 HRS): SARS Coronavirus 2: NEGATIVE

## 2019-01-18 ENCOUNTER — Encounter: Payer: Self-pay | Admitting: Gastroenterology

## 2019-01-19 ENCOUNTER — Ambulatory Visit: Payer: No Typology Code available for payment source | Admitting: Registered Nurse

## 2019-01-19 ENCOUNTER — Other Ambulatory Visit: Payer: Self-pay

## 2019-01-19 ENCOUNTER — Encounter
Admission: RE | Disposition: A | Discharge status: Home / Self Care | Payer: Self-pay | Source: Home / Self Care | Attending: Gastroenterology

## 2019-01-19 ENCOUNTER — Encounter: Payer: Self-pay | Admitting: Gastroenterology

## 2019-01-19 ENCOUNTER — Ambulatory Visit
Admission: RE | Admit: 2019-01-19 | Discharge: 2019-01-19 | Disposition: A | Discharge status: Home / Self Care | Payer: No Typology Code available for payment source | Attending: Gastroenterology | Admitting: Gastroenterology

## 2019-01-19 DIAGNOSIS — Z79899 Other long term (current) drug therapy: Secondary | ICD-10-CM | POA: Diagnosis not present

## 2019-01-19 DIAGNOSIS — K208 Other esophagitis without bleeding: Secondary | ICD-10-CM | POA: Diagnosis not present

## 2019-01-19 DIAGNOSIS — K219 Gastro-esophageal reflux disease without esophagitis: Secondary | ICD-10-CM | POA: Insufficient documentation

## 2019-01-19 DIAGNOSIS — F329 Major depressive disorder, single episode, unspecified: Secondary | ICD-10-CM | POA: Diagnosis not present

## 2019-01-19 DIAGNOSIS — K228 Other specified diseases of esophagus: Secondary | ICD-10-CM | POA: Diagnosis not present

## 2019-01-19 DIAGNOSIS — Z87891 Personal history of nicotine dependence: Secondary | ICD-10-CM | POA: Insufficient documentation

## 2019-01-19 DIAGNOSIS — Z6841 Body Mass Index (BMI) 40.0 and over, adult: Secondary | ICD-10-CM | POA: Diagnosis not present

## 2019-01-19 DIAGNOSIS — R1310 Dysphagia, unspecified: Secondary | ICD-10-CM | POA: Diagnosis present

## 2019-01-19 DIAGNOSIS — F419 Anxiety disorder, unspecified: Secondary | ICD-10-CM | POA: Diagnosis not present

## 2019-01-19 DIAGNOSIS — K222 Esophageal obstruction: Secondary | ICD-10-CM | POA: Diagnosis not present

## 2019-01-19 HISTORY — PX: ESOPHAGOGASTRODUODENOSCOPY (EGD) WITH PROPOFOL: SHX5813

## 2019-01-19 SURGERY — ESOPHAGOGASTRODUODENOSCOPY (EGD) WITH PROPOFOL
Anesthesia: General

## 2019-01-19 MED ORDER — PROPOFOL 500 MG/50ML IV EMUL
INTRAVENOUS | Status: DC | PRN
  Administered 2019-01-19: 140 ug/kg/min via INTRAVENOUS

## 2019-01-19 MED ORDER — SODIUM CHLORIDE 0.9 % IV SOLN: INTRAVENOUS | Status: DC

## 2019-01-19 MED ORDER — PROPOFOL 10 MG/ML IV BOLUS
INTRAVENOUS | Status: DC | PRN
  Administered 2019-01-19: 70 mg via INTRAVENOUS

## 2019-01-19 NOTE — Anesthesia Post-op Follow-up Note (Signed)
Anesthesia QCDR form completed.        

## 2019-01-19 NOTE — Interval H&P Note (Signed)
History and Physical Interval Note:  01/19/2019 9:17 AM  Patricia Mcbride  has presented today for surgery, with the diagnosis of Dysphagia R13.10.  The various methods of treatment have been discussed with the patient and family. After consideration of risks, benefits and other options for treatment, the patient has consented to  Procedure(s): ESOPHAGOGASTRODUODENOSCOPY (EGD) WITH PROPOFOL (N/A) as a surgical intervention.  The patient's history has been reviewed, patient examined, no change in status, stable for surgery.  I have reviewed the patient's chart and labs.  Questions were answered to the patient's satisfaction.     Nayomi Tabron [employer]

## 2019-01-19 NOTE — Op Note (Signed)
[facility] Gastroenterology Patient Name: Patricia Mcbride Procedure Date: 01/19/2019 9:14 AM MRN: [id number] Account #: [id number] Date of Birth: [birth year] Admit Type: Outpatient Age: 38 Room: [facility] ENDO ROOM 4 Gender: Female Note Status: Finalized Procedure:             Upper GI endoscopy Indications:           Dysphagia Providers:             Midge Minium MD, MD Referring MD:          Duncan Dull, MD (Referring MD) Medicines:             Propofol per Anesthesia Complications:         No immediate complications. Procedure:             Pre-Anesthesia Assessment:                        - Prior to the procedure, a History and Physical was                         performed, and patient medications and allergies were                         reviewed. The patient's tolerance of previous                         anesthesia was also reviewed. The risks and benefits                         of the procedure and the sedation options and risks                         were discussed with the patient. All questions were                         answered, and informed consent was obtained. Prior                         Anticoagulants: The patient has taken no previous                         anticoagulant or antiplatelet agents. ASA Grade                         Assessment: II - A patient with mild systemic disease.                         After reviewing the risks and benefits, the patient                         was deemed in satisfactory condition to undergo the                         procedure.                        After obtaining informed consent, the endoscope was  passed under direct vision. Throughout the procedure,                         the patient's blood pressure, pulse, and oxygen                         saturations were monitored continuously. The Endoscope                         was introduced through the mouth, and advanced to the                          second part of duodenum. The upper GI endoscopy was                         accomplished without difficulty. The patient tolerated                         the procedure well. Findings:      One benign-appearing, intrinsic mild stenosis was found at the       gastroesophageal junction. The stenosis was traversed. A TTS dilator was       passed through the scope. Dilation with a 15-16.5-18 mm balloon dilator       was performed to 18 mm. The dilation site was examined following       endoscope reinsertion and showed complete resolution of luminal       narrowing.      The Z-line was irregular and was found at the gastroesophageal junction.      Two biopsies were obtained with cold forceps for histology in the middle       third of the esophagus.      The stomach was normal.      The examined duodenum was normal. Impression:            - Benign-appearing esophageal stenosis. Dilated.                        - Z-line irregular, at the gastroesophageal junction.                        - Normal stomach.                        - Normal examined duodenum.                        - Biopsy performed in the middle third of the                         esophagus. Recommendation:        - Discharge patient to home.                        - Resume previous diet.                        - Continue present medications.                        - Await pathology results. Procedure Code(s):     --- Professional ---  [phone], Esophagogastroduodenoscopy, flexible,                         transoral; with transendoscopic balloon dilation of                         esophagus (less than 30 mm diameter)                        43239, 59, Esophagogastroduodenoscopy, flexible,                         transoral; with biopsy, single or multiple Diagnosis Code(s):     --- Professional ---                        R13.10, Dysphagia, unspecified                        K22.2,  Esophageal obstruction CPT copyright 2019 American Medical Association. All rights reserved. The codes documented in this report are preliminary and upon coder review may  be revised to meet current compliance requirements. Midge Minium MD, MD 01/19/2019 9:32:37 AM This report has been signed electronically. Number of Addenda: 0 Note Initiated On: 01/19/2019 9:14 AM Estimated Blood Loss:  Estimated blood loss: none.      [facility]

## 2019-01-19 NOTE — Transfer of Care (Signed)
Immediate Anesthesia Transfer of Care Note  Patient: Patricia Mcbride  Procedure(s) Performed: ESOPHAGOGASTRODUODENOSCOPY (EGD) WITH PROPOFOL (N/A )  Patient Location: PACU  Anesthesia Type:General  Level of Consciousness: sedated  Airway & Oxygen Therapy: Patient Spontanous Breathing  Post-op Assessment: Report given to RN and Post -op Vital signs reviewed and stable  Post vital signs: Reviewed and stable  Last Vitals:  Vitals Value Taken Time  BP 117/67 01/19/19 0936  Temp 36.3 C 01/19/19 0934  Pulse 97 01/19/19 0936  Resp 14 01/19/19 0936  SpO2 97 % 01/19/19 0936  Vitals shown include unvalidated device data.  Last Pain:  Vitals:   01/19/19 0934  TempSrc: Temporal  PainSc: 0-No pain         Complications: No apparent anesthesia complications

## 2019-01-19 NOTE — Anesthesia Preprocedure Evaluation (Signed)
Anesthesia Evaluation  Patient identified by MRN, date of birth, ID band Patient awake    Reviewed: Allergy & Precautions, H&P , NPO status , Patient's Chart, lab work & pertinent test results, reviewed documented beta blocker date and time   History of Anesthesia Complications Negative for: history of anesthetic complications  Airway Mallampati: I  TM Distance: >3 FB Neck ROM: full    Dental  (+) Missing, Dental Advidsory Given, Teeth Intact   Pulmonary neg pulmonary ROS, former smoker,    Pulmonary exam normal        Cardiovascular Exercise Tolerance: Good negative cardio ROS Normal cardiovascular exam     Neuro/Psych PSYCHIATRIC DISORDERS Anxiety Depression negative neurological ROS     GI/Hepatic Neg liver ROS, GERD  ,  Endo/Other  diabetes, Type 2Morbid obesity  Renal/GU negative Renal ROS  negative genitourinary   Musculoskeletal   Abdominal   Peds  Hematology negative hematology ROS (+)   Anesthesia Other Findings Past Medical History: No date: Depression No date: GERD (gastroesophageal reflux disease) No date: Jaundice of newborn   Reproductive/Obstetrics negative OB ROS                             Anesthesia Physical Anesthesia Plan  ASA: III  Anesthesia Plan: General   Post-op Pain Management:    Induction: Intravenous  PONV Risk Score and Plan: 3 and Propofol infusion and TIVA  Airway Management Planned: Natural Airway and Nasal Cannula  Additional Equipment:   Intra-op Plan:   Post-operative Plan:   Informed Consent: I have reviewed the patients History and Physical, chart, labs and discussed the procedure including the risks, benefits and alternatives for the proposed anesthesia with the patient or authorized representative who has indicated his/her understanding and acceptance.     Dental Advisory Given  Plan Discussed with: Anesthesiologist, CRNA and  Surgeon  Anesthesia Plan Comments:         Anesthesia Quick Evaluation

## 2019-01-19 NOTE — Anesthesia Postprocedure Evaluation (Signed)
Anesthesia Post Note  Patient: Patricia Mcbride  Procedure(s) Performed: ESOPHAGOGASTRODUODENOSCOPY (EGD) WITH PROPOFOL (N/A )  Patient location during evaluation: Endoscopy Anesthesia Type: General Level of consciousness: awake and alert Pain management: pain level controlled Vital Signs Assessment: post-procedure vital signs reviewed and stable Respiratory status: spontaneous breathing, nonlabored ventilation, respiratory function stable and patient connected to nasal cannula oxygen Cardiovascular status: blood pressure returned to baseline and stable Postop Assessment: no apparent nausea or vomiting Anesthetic complications: no     Last Vitals:  Vitals:   01/19/19 0934 01/19/19 0936  BP: 117/67 117/67  Pulse:  100  Resp:  18  Temp: (!) 36.3 C   SpO2:  97%    Last Pain:  Vitals:   01/19/19 0934  TempSrc: Temporal  PainSc: 0-No pain                 Martha Clan

## 2019-01-20 ENCOUNTER — Encounter: Payer: Self-pay | Admitting: *Deleted

## 2019-01-20 LAB — SURGICAL PATHOLOGY

## 2019-01-25 ENCOUNTER — Telehealth: Payer: Self-pay

## 2019-01-25 NOTE — Telephone Encounter (Signed)
-----   Message from Midge Minium, MD sent at 01/24/2019  9:40 AM EST ----- Let the patient know that the biopsies of her esophagus showed some inflammation and this may be caused by reflux but can also be caused by increased white cells causing the esophagus not to contract normally.  If her PPI is not working to help her then she should be tried on a trial of fluticasone for possible EOE.

## 2019-01-25 NOTE — Telephone Encounter (Signed)
Mychart message has been sent to pt regarding results.

## 2019-02-18 ENCOUNTER — Other Ambulatory Visit: Payer: Self-pay

## 2019-02-18 MED ORDER — BUPROPION HCL ER (XL) 150 MG PO TB24: 150.0000 mg | ORAL_TABLET | Freq: Every day | ORAL | 1 refills | Status: DC

## 2019-05-06 ENCOUNTER — Other Ambulatory Visit: Payer: Self-pay

## 2019-05-06 MED ORDER — CITALOPRAM HYDROBROMIDE 20 MG PO TABS: 20.0000 mg | ORAL_TABLET | Freq: Every day | ORAL | 2 refills | Status: DC

## 2019-05-10 ENCOUNTER — Other Ambulatory Visit: Payer: Self-pay

## 2019-05-10 MED ORDER — PANTOPRAZOLE SODIUM 40 MG PO TBEC: 40.0000 mg | DELAYED_RELEASE_TABLET | Freq: Every day | ORAL | 3 refills | Status: DC

## 2019-09-20 ENCOUNTER — Other Ambulatory Visit: Payer: Self-pay

## 2019-09-20 MED ORDER — PANTOPRAZOLE SODIUM 40 MG PO TBEC: 40.0000 mg | DELAYED_RELEASE_TABLET | Freq: Every day | ORAL | 6 refills | Status: DC

## 2020-01-02 ENCOUNTER — Other Ambulatory Visit: Payer: Self-pay | Admitting: Internal Medicine

## 2020-02-16 ENCOUNTER — Emergency Department (HOSPITAL_COMMUNITY): Admission: EM | Admit: 2020-02-16 | Payer: No Typology Code available for payment source | Source: Home / Self Care

## 2020-02-16 NOTE — ED Notes (Signed)
Pt name called for triage, no response 

## 2020-11-27 ENCOUNTER — Other Ambulatory Visit: Payer: Self-pay | Admitting: Internal Medicine

## 2020-12-05 DIAGNOSIS — B009 Herpesviral infection, unspecified: Secondary | ICD-10-CM | POA: Insufficient documentation

## 2021-02-13 ENCOUNTER — Telehealth: Payer: Self-pay | Admitting: Internal Medicine

## 2021-02-13 NOTE — Telephone Encounter (Signed)
LVM that was trying to set up NP appt per her request. Asked she give [place] a [place] back

## 2021-02-13 NOTE — Telephone Encounter (Signed)
Pt called in requesting TOC to NP [place]. Pt is stating that Dr. Derrel Nip and herself have difference in opinion of care. Pt was advise she needs approval from both providers.

## 2021-02-13 NOTE — Telephone Encounter (Signed)
Copied from [place] [phone]. Topic: Appointment Scheduling - Scheduling Inquiry for Clinic >> Feb 13, 2021 11:28 AM Greggory Keen D wrote: Reason for CRM: Pt would like to schedule an appt with one of the new providers.  She will be a new diabetic patient.  CB#  [phone]

## 2021-02-15 IMAGING — RF DG ESOPHAGUS
9 of 11 series · 14 of 24 positions shown · non-contrast
Comparison: None.

CLINICAL DATA: Dysphagia

EXAM:
ESOPHOGRAM / BARIUM SWALLOW / BARIUM TABLET STUDY
TECHNIQUE: Combined double contrast and single contrast examination performed
using effervescent crystals, thick barium liquid, and thin barium
liquid. The patient was observed with fluoroscopy swallowing a 13 mm
barium sulphate tablet.
FLUOROSCOPY TIME:  Fluoroscopy Time:  [DATE]
Number of Acquired Spot Images: 45

[Series 1: fluoro_barium 2fps_bw · 0.17mm/px · 2 of 5 frames shown (1 of 9)]
[frame 1/5]
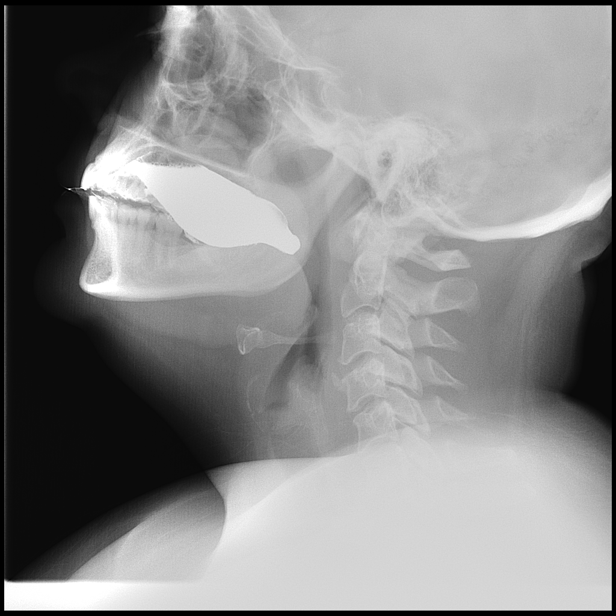
[frame 3/5]
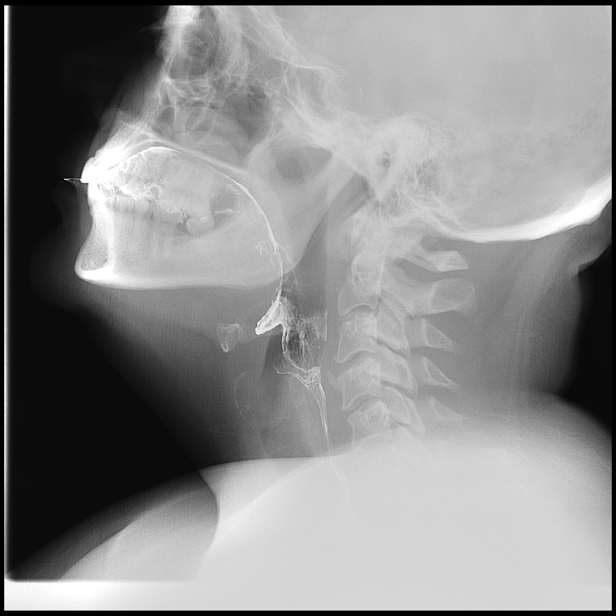

[Series 2: fluoro_barium 2fps_bw · 0.17mm/px · 1 of 2 frames shown (2 of 9)]
[frame 2/2]
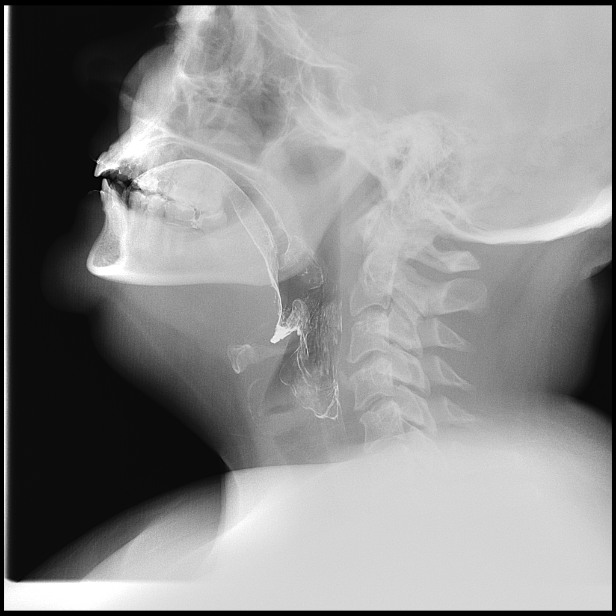

[Series 3: fluoro_barium 2fps_bw · 0.17mm/px · 1 of 4 frames shown (3 of 9)]
[frame 3/4]
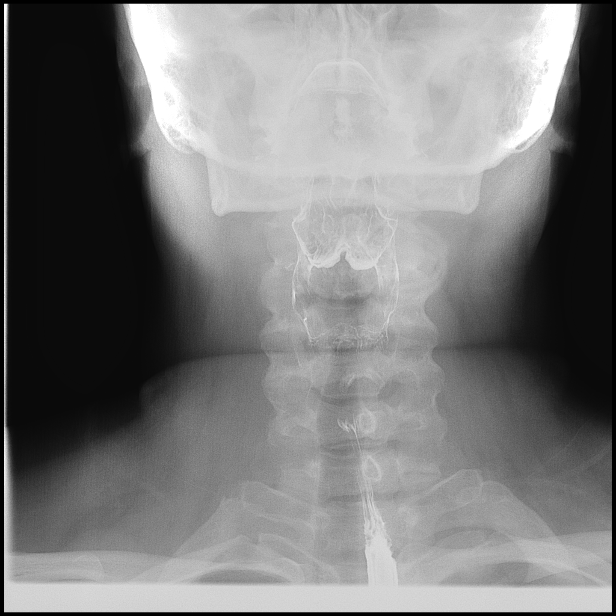

[Series 4: fluoro_barium 2fps_bw · 0.17mm/px · 1 of 2 frames shown (4 of 9)]
[frame 1/2]
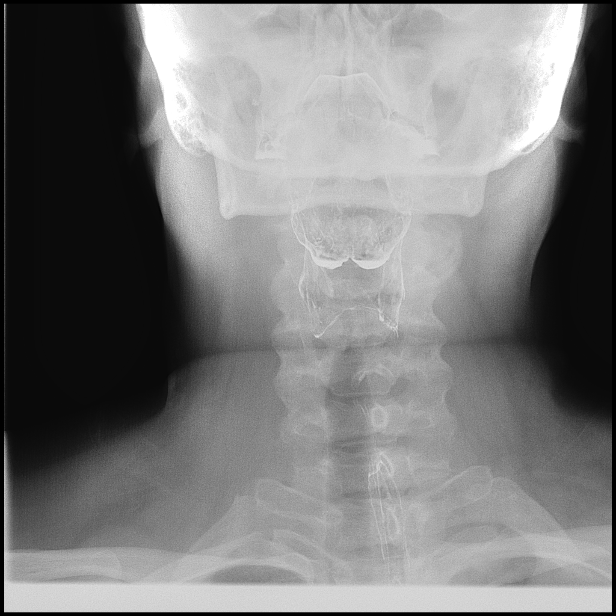

[Series 5: fluoro_barium 2fps_bw · 0.17mm/px · 2 of 5 frames shown (5 of 9)]
[frame 1/5]
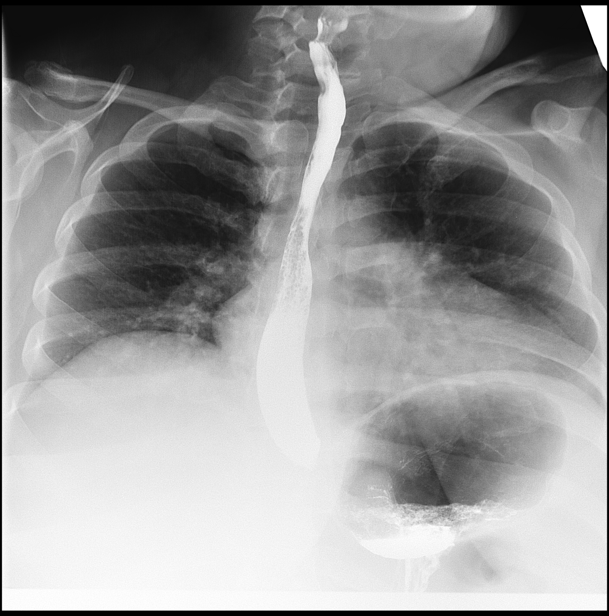
[frame 4/5]
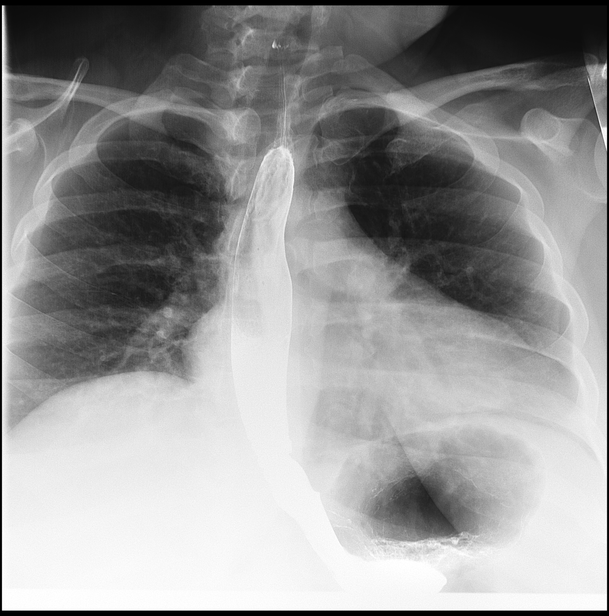

[Series 6: fluoro_barium 2fps_bw · 0.17mm/px · 1 of 2 frames shown (6 of 9)]
[frame 1/2]
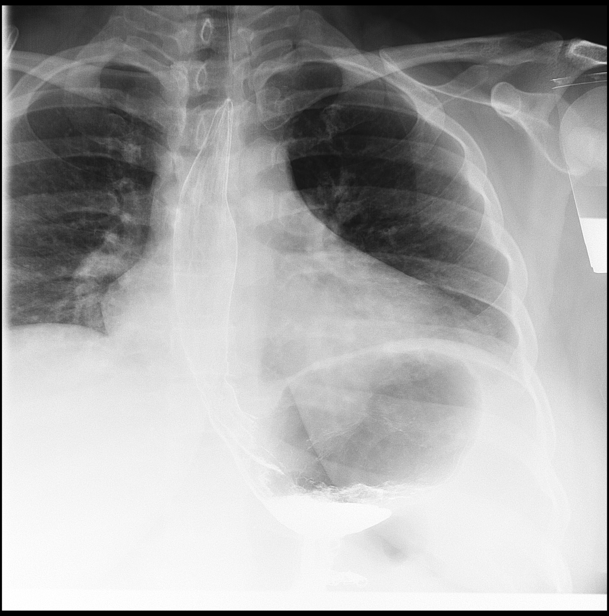

[Series 7: fluoro_barium 2fps_bw · 0.17mm/px · 2 of 17 frames shown (7 of 9)]
[frame 3/17]
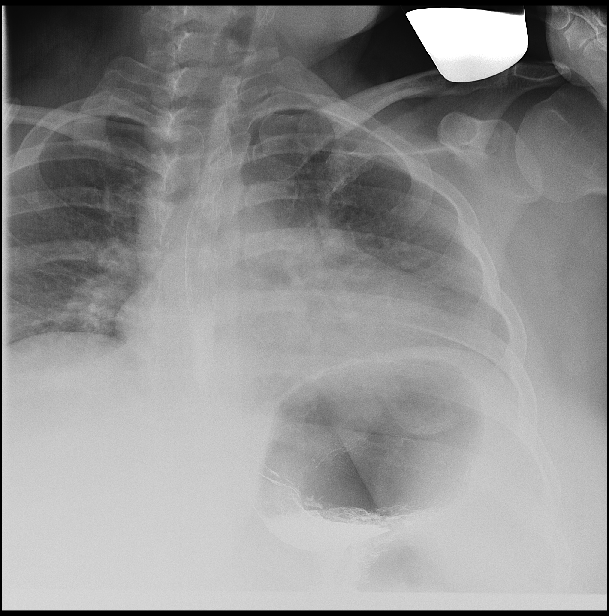
[frame 15/17]
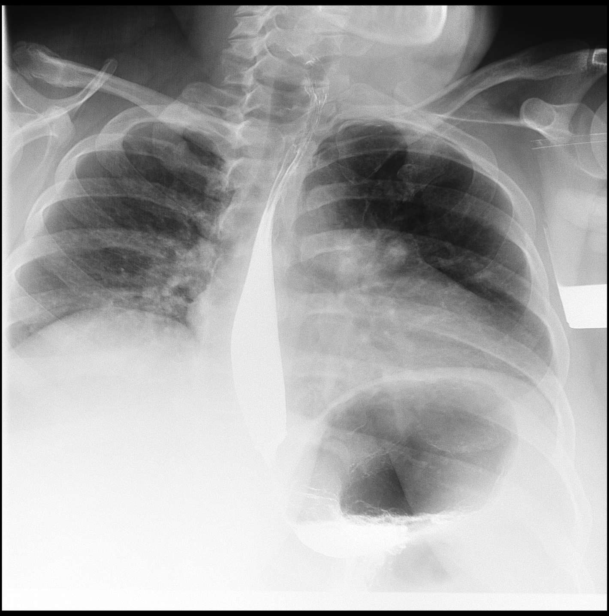

[Series 9: fluoro_barium 2fps_bw · 0.18mm/px · 2 of 2 frames shown (8 of 9)]
[frame 1/2]
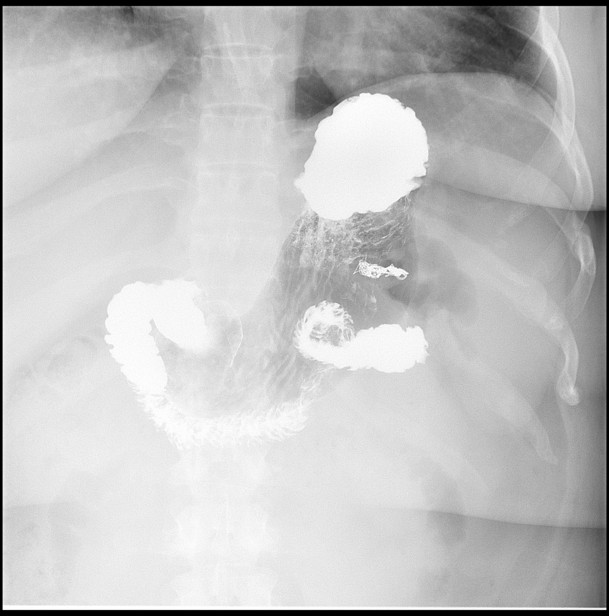
[frame 2/2]
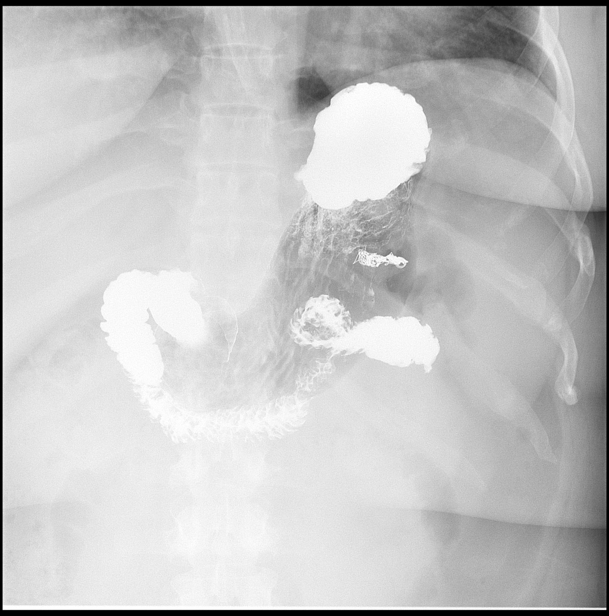

[Series 11: fluoro_barium 2fps_bw · 0.18mm/px · 2 of 3 frames shown (9 of 9)]
[frame 1/3]
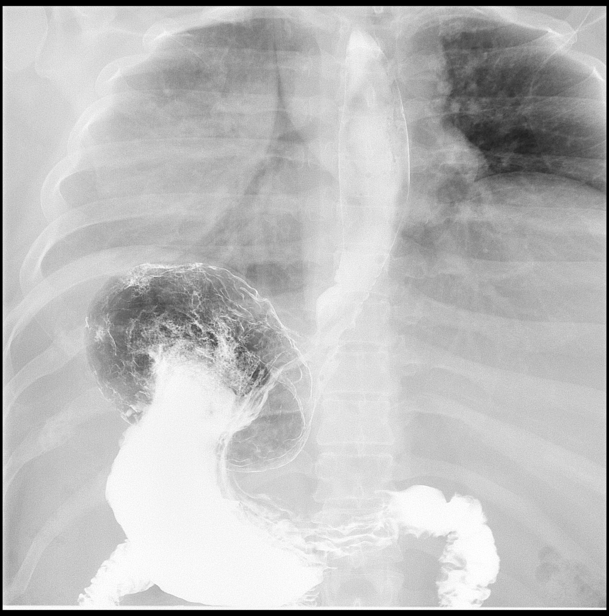
[frame 3/3]
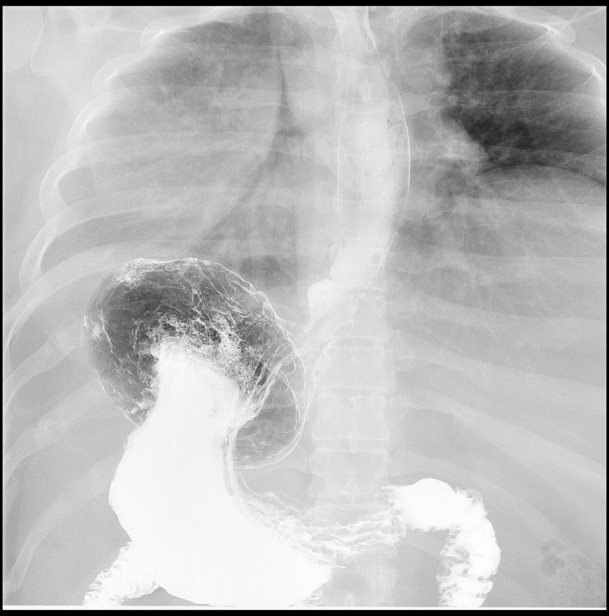

[14 of 24 positions shown; findings below may reference images not displayed]

FINDINGS: Normal oropharyngeal phase of swallow. No evidence of penetration or
aspiration.

Normal contour and caliber of the esophagus. The gastroesophageal
junction is patent. A swallowed 13 mm barium tablet passes readily.
No spontaneous or provoked gastroesophageal reflux.

Normal partial double contrast appearance of the stomach and
proximal small bowel.
IMPRESSION: Normal double contrast barium swallow examination.

## 2021-04-12 ENCOUNTER — Ambulatory Visit (INDEPENDENT_AMBULATORY_CARE_PROVIDER_SITE_OTHER): Payer: 59 | Admitting: Nurse Practitioner

## 2021-04-12 ENCOUNTER — Encounter: Payer: Self-pay | Admitting: Nurse Practitioner

## 2021-04-12 ENCOUNTER — Other Ambulatory Visit: Payer: Self-pay

## 2021-04-12 VITALS — BP 124/76 | HR 98 | Temp 98.0°F | Resp 18 | Ht 62.0 in | Wt 262.3 lb

## 2021-04-12 DIAGNOSIS — Z7689 Persons encountering health services in other specified circumstances: Secondary | ICD-10-CM

## 2021-04-12 DIAGNOSIS — K219 Gastro-esophageal reflux disease without esophagitis: Secondary | ICD-10-CM | POA: Diagnosis not present

## 2021-04-12 DIAGNOSIS — E1165 Type 2 diabetes mellitus with hyperglycemia: Secondary | ICD-10-CM

## 2021-04-12 DIAGNOSIS — K222 Esophageal obstruction: Secondary | ICD-10-CM

## 2021-04-12 DIAGNOSIS — F339 Major depressive disorder, recurrent, unspecified: Secondary | ICD-10-CM | POA: Diagnosis not present

## 2021-04-12 DIAGNOSIS — Z13220 Encounter for screening for lipoid disorders: Secondary | ICD-10-CM

## 2021-04-12 DIAGNOSIS — Z79899 Other long term (current) drug therapy: Secondary | ICD-10-CM

## 2021-04-12 DIAGNOSIS — Z130 Encounter for screening for diseases of the blood and blood-forming organs and certain disorders involving the immune mechanism: Secondary | ICD-10-CM

## 2021-04-12 MED ORDER — CITALOPRAM HYDROBROMIDE 40 MG PO TABS: 40.0000 mg | ORAL_TABLET | Freq: Every day | ORAL | 0 refills | Status: DC

## 2021-04-12 NOTE — Progress Notes (Addendum)
? ?BP 124/76   Pulse 98   Temp 98 ?F (36.7 ?C) (Oral)   Resp 18   Ht 5\' 2"  (1.575 m)   Wt 262 lb 4.8 oz (119 kg)   LMP 04/05/2021   SpO2 98%   BMI 47.98 kg/m?   ? ?Subjective:  ? ? Patient ID: Patricia Mcbride, female    DOB: [birth year], 41 y.o.   MRN: [id number] ? ?HPI: ?Patricia Mcbride is a 41 y.o. female ? ?Chief Complaint  ?Patient presents with  ? Establish Care  ? Diabetes  ? ?Establish care: Last physical was many years ago, recently diagnosed with type 2 diabetes with an A1C of 11.  She is not currently on medication.  She sees GYN for paps.  ? ?DM2: She was being seen by her GYN regarding recurrent yeast infections.   Her A1C was 11 on 02/12/2021. She is not currently on medication. She has been watching her diet and would like to recheck her A1C today.  Will get labs to verify elevated A1C, plan will be to start metformin with diet and exercise.  She is getting married next month and is not doing anything to prevent pregnancy.  May need to start insulin. ? ?Obesity:  She is currently working on eating healthier. She says she does like to exercise. She has been going to the gym and likes to walk.  She says she has tried many different things to help lose weight.   ? ?Depression/anxiety: PHQ9 and GAD scores are positive. She is currently on celexa 20 mg daily and would like to increase her dose. She denies any suicidal thoughts.  Will increase dose to 40 mg daily.  Discussed follow up in one month but she is getting married so she will follow up after her honeymoon.  ? ?  04/12/2021  ? 10:37 AM 11/18/2018  ?  3:51 PM  ?Depression screen PHQ 2/9  ?Decreased Interest 1 2  ?Down, Depressed, Hopeless 2 1  ?PHQ - 2 Score 3 3  ?Altered sleeping 1 1  ?Tired, decreased energy 1 2  ?Change in appetite 1 3  ?Feeling bad or failure about yourself  0 3  ?Trouble concentrating 0 0  ?Moving slowly or fidgety/restless 0 0  ?Suicidal thoughts 0 0  ?PHQ-9 Score 6 12  ?Difficult doing work/chores  Somewhat difficult Somewhat difficult  ?  ? ?  04/12/2021  ? 10:40 AM  ?GAD 7 : Generalized Anxiety Score  ?Nervous, Anxious, on Edge 2  ?Control/stop worrying 2  ?Worry too much - different things 2  ?Trouble relaxing 2  ?Restless 1  ?Easily annoyed or irritable 2  ?Afraid - awful might happen 2  ?Total GAD 7 Score 13  ?Anxiety Difficulty Somewhat difficult  ? ? ?GERD:  she is currently taking pantoprazole 40 mg daily.  She says she has had to have her esophagus stretched about two years ago.  ? ?Relevant past medical, surgical, family and social history reviewed and updated as indicated. Interim medical history since our last visit reviewed. ?Allergies and medications reviewed and updated. ? ?Review of Systems ? ?Constitutional: Negative for fever or weight change.  ?Respiratory: Negative for cough and shortness of breath.   ?Cardiovascular: Negative for chest pain or palpitations.  ?Gastrointestinal: Negative for abdominal pain, no bowel changes.  ?Musculoskeletal: Negative for gait problem or joint swelling.  ?Skin: Negative for rash.  ?Neurological: Negative for dizziness or headache.  ?No other specific complaints in a complete review of  systems (except as listed in HPI above).  ? ?   ?Objective:  ?  ?BP 124/76   Pulse 98   Temp 98 ?F (36.7 ?C) (Oral)   Resp 18   Ht 5\' 2"  (1.575 m)   Wt 262 lb 4.8 oz (119 kg)   LMP 04/05/2021   SpO2 98%   BMI 47.98 kg/m?   ?Wt Readings from Last 3 Encounters:  ?04/12/21 262 lb 4.8 oz (119 kg)  ?01/19/19 270 lb (122.5 kg)  ?01/05/19 270 lb (122.5 kg)  ?  ?Physical Exam ? ?Constitutional: Patient appears well-developed and well-nourished. Obese  No distress.  ?HEENT: head atraumatic, normocephalic, pupils equal and reactive to light,  neck supple ?Cardiovascular: Normal rate, regular rhythm and normal heart sounds.  No murmur heard. No BLE edema. ?Pulmonary/Chest: Effort normal and breath sounds normal. No respiratory distress. ?Abdominal: Soft.  There is no  tenderness. ?Psychiatric: Patient has a normal mood and affect. behavior is normal. Judgment and thought content normal.  ?Diabetic Foot Exam - Simple   ?Simple Foot Form ?Diabetic Foot exam was performed with the following findings: Yes 04/12/2021 11:06 AM  ?Visual Inspection ?No deformities, no ulcerations, no other skin breakdown bilaterally: Yes ?Sensation Testing ?Intact to touch and monofilament testing bilaterally: Yes ?Pulse Check ?Posterior Tibialis and Dorsalis pulse intact bilaterally: Yes ?Comments ?  ?  ?Results for orders placed or performed during the hospital encounter of 01/19/19  ?Surgical pathology  ?Result Value Ref Range  ? SURGICAL PATHOLOGY    ?  SURGICAL PATHOLOGY ?CASE: [phone] ?PATIENT: [name] ?Surgical Pathology Report ? ? ? ? ?Specimen Submitted: ?A. Esophagus; cbx ? ?Clinical History: Dysphagia R13.10. Esophageal stricture. ? ? ? ?DIAGNOSIS: ?A. ESOPHAGUS; COLD BIOPSY: ?- BENIGN SQUAMOUS MUCOSA WITH ACANTHOSIS, SPONGIOSIS, AND MIXED ?INFLAMMATION. ?- MODERATE INCREASE IN INTRAEPITHELIAL EOSINOPHILS (UP TO 15 PER HPF). ?- NEGATIVE FOR DYSPLASIA AND MALIGNANCY. ? ?Comment: ?Eosinophilic esophagitis (EoE) is a chronic, immune/antigen mediated ?esophageal disease characterized clinically by symptoms related to ?esophageal dysfunction and histologically by eosinophil predominant ?inflammation, defined as more than or equal to 15 eos/hpf (approx 60 ?eos/mm2) in the vast majority of cases. Based on recent consensus ?guidelines published in 2018 proton pump inhibitor (PPI) trial prior to ?treatment is no longer required. The new standard recognizes PPI therapy ?as a treatment for EoE rather than a diagnostic crit erion. ?The updated EoE diagnostic criteria include: ?1. Symptoms of esophageal dysfunction ?    - Concomitant atopic conditions should increase suspicion for EoE ?    - Endoscopic findings of rings, furrows exudates, edema, stricture, ?narrowing and crepe paper mucosa  should increase suspicion for EoE ?(whereas previously, endoscopic findings was not an indication to ?biopsy) ?2. =15 eos/hpf (60 eos/mm2) on esophageal biopsy ?    - Eosinophilic infiltration should be isolated to the esophagus ?3. Assessment of non-EoE disorders that cause or potentially contribute ?to esophageal eosinophilia ? ?Reference: Gastroenterology. 2018 Oct;155(4):1022-1033.e10 ? ?GROSS DESCRIPTION: ?A. Labeled: C BX esophageal ?Received: Formalin ?Tissue fragment(s): 1 ?Size: 0.4 cm ?Description: Pale-tan soft tissue fragment ?Entirely submitted in 1 cassette. ? ? ? ?Final Diagnosis performed by Katherine Mantle, MD.   Electronically signed ?01/20/2019 10:38:54AM ?The electronic signature indicates that the named  Attending Pathologist ?has evaluated the specimen ?Technical component performed at [facility], [address] [place], ?[place] [address] Lab: [phone] Dir: Jolene Schimke, MD, MMM ? Professional component performed at [facility], [facility], [address] [place], [place], [place] [address] Lab: [phone] ?Dir: Georgiann Cocker. Oneita Kras, MD ?  ? ?   ?  Assessment & Plan:  ? ?1. Type 2 diabetes mellitus with hyperglycemia, without long-term current use of insulin (HCC) ?-will likely start metformin, discussed side effects with patient, awaiting A1C results ?- COMPLETE METABOLIC PANEL WITH GFR ?- Hemoglobin A1c ?- HM Diabetes Foot Exam ? ?2. Major depressive disorder, recurrent episode with anxious distress (HCC) ? ?- citalopram (CELEXA) 40 MG tablet; Take 1 tablet (40 mg total) by mouth daily.  Dispense: 90 tablet; Refill: 0 ? ?3. Gastroesophageal reflux disease without esophagitis ? ?-continue with current treatment ? ?4. Stricture and stenosis of esophagus ?-continue monitoring for symptoms ? ?6. Screening for cholesterol level ? ?- Lipid panel ? ?7. Screening for deficiency anemia ? ?- CBC with Differential/Platelet ? ?8. Medication management ? ?- CBC with Differential/Platelet ?- COMPLETE  METABOLIC PANEL WITH GFR ? ?9. Encounter to establish care ?- schedule cpe later in the year ? ?Follow up plan: ?Return in about 3 months (around 07/13/2021) for follow up of diabetes, follow up for depression after honeymoon. ? ? ? ? ? ?

## 2021-04-13 ENCOUNTER — Encounter: Payer: Self-pay | Admitting: Nurse Practitioner

## 2021-04-13 ENCOUNTER — Other Ambulatory Visit: Payer: Self-pay | Admitting: Nurse Practitioner

## 2021-04-13 DIAGNOSIS — E1165 Type 2 diabetes mellitus with hyperglycemia: Secondary | ICD-10-CM

## 2021-04-13 LAB — COMPLETE METABOLIC PANEL WITH GFR
AG Ratio: 1.7 (calc) (ref 1.0–2.5)
ALT: 17 U/L (ref 6–29)
AST: 12 U/L (ref 10–30)
Albumin: 4.4 g/dL (ref 3.6–5.1)
Alkaline phosphatase (APISO): 108 U/L (ref 31–125)
BUN: 10 mg/dL (ref 7–25)
CO2: 27 mmol/L (ref 20–32)
Calcium: 9.5 mg/dL (ref 8.6–10.2)
Chloride: 101 mmol/L (ref 98–110)
Creat: 0.64 mg/dL (ref 0.50–0.97)
Globulin: 2.6 g/dL (calc) (ref 1.9–3.7)
Glucose, Bld: 262 mg/dL — ABNORMAL HIGH (ref 65–99)
Potassium: 4.4 mmol/L (ref 3.5–5.3)
Sodium: 137 mmol/L (ref 135–146)
Total Bilirubin: 0.4 mg/dL (ref 0.2–1.2)
Total Protein: 7 g/dL (ref 6.1–8.1)
eGFR: 116 mL/min/{1.73_m2} (ref >=60)

## 2021-04-13 LAB — CBC WITH DIFFERENTIAL/PLATELET
Absolute Monocytes: 794 cells/uL (ref 200–950)
Basophils Absolute: 74 cells/uL (ref 0–200)
Basophils Relative: 0.6 %
Eosinophils Absolute: 335 cells/uL (ref 15–500)
Eosinophils Relative: 2.7 %
HCT: 40.4 % (ref 35.0–45.0)
Hemoglobin: 13.4 g/dL (ref 11.7–15.5)
Lymphs Abs: 3336 cells/uL (ref 850–3900)
MCH: 29.1 pg (ref 27.0–33.0)
MCHC: 33.2 g/dL (ref 32.0–36.0)
MCV: 87.6 fL (ref 80.0–100.0)
MPV: 11.2 fL (ref 7.5–12.5)
Monocytes Relative: 6.4 %
Neutro Abs: 7862 cells/uL — ABNORMAL HIGH (ref 1500–7800)
Neutrophils Relative %: 63.4 %
Platelets: 367 10*3/uL (ref 140–400)
RBC: 4.61 10*6/uL (ref 3.80–5.10)
RDW: 12.2 % (ref 11.0–15.0)
Total Lymphocyte: 26.9 %
WBC: 12.4 10*3/uL — ABNORMAL HIGH (ref 3.8–10.8)

## 2021-04-13 LAB — HEMOGLOBIN A1C
Hgb A1c MFr Bld: 11.6 % of total Hgb — ABNORMAL HIGH (ref <5.7)
Mean Plasma Glucose: 286 mg/dL
eAG (mmol/L): 15.9 mmol/L

## 2021-04-13 LAB — LIPID PANEL
Cholesterol: 214 mg/dL — ABNORMAL HIGH (ref <200)
HDL: 30 mg/dL — ABNORMAL LOW (ref >=50)
LDL Cholesterol (Calc): 144 mg/dL (calc) — ABNORMAL HIGH
Non-HDL Cholesterol (Calc): 184 mg/dL (calc) — ABNORMAL HIGH (ref <130)
Total CHOL/HDL Ratio: 7.1 (calc) — ABNORMAL HIGH (ref <5.0)
Triglycerides: 246 mg/dL — ABNORMAL HIGH (ref <150)

## 2021-04-13 MED ORDER — METFORMIN HCL 500 MG PO TABS: 500.0000 mg | ORAL_TABLET | Freq: Two times a day (BID) | ORAL | 3 refills | Status: DC

## 2021-05-24 ENCOUNTER — Ambulatory Visit: Payer: No Typology Code available for payment source | Admitting: Nurse Practitioner

## 2021-05-28 ENCOUNTER — Other Ambulatory Visit: Payer: Self-pay

## 2021-05-28 ENCOUNTER — Encounter: Payer: Self-pay | Admitting: Nurse Practitioner

## 2021-05-28 ENCOUNTER — Ambulatory Visit (INDEPENDENT_AMBULATORY_CARE_PROVIDER_SITE_OTHER): Payer: 59 | Admitting: Nurse Practitioner

## 2021-05-28 VITALS — BP 118/74 | HR 96 | Temp 98.0°F | Resp 16 | Ht 62.0 in | Wt 258.2 lb

## 2021-05-28 DIAGNOSIS — F339 Major depressive disorder, recurrent, unspecified: Secondary | ICD-10-CM

## 2021-05-28 DIAGNOSIS — J069 Acute upper respiratory infection, unspecified: Secondary | ICD-10-CM | POA: Diagnosis not present

## 2021-05-28 DIAGNOSIS — Z20822 Contact with and (suspected) exposure to covid-19: Secondary | ICD-10-CM | POA: Diagnosis not present

## 2021-05-28 DIAGNOSIS — E1165 Type 2 diabetes mellitus with hyperglycemia: Secondary | ICD-10-CM | POA: Insufficient documentation

## 2021-05-28 HISTORY — DX: Type 2 diabetes mellitus with hyperglycemia: E11.65

## 2021-05-28 MED ORDER — METFORMIN HCL 500 MG PO TABS: 500.0000 mg | ORAL_TABLET | Freq: Every day | ORAL | 3 refills | Status: DC

## 2021-05-28 MED ORDER — CITALOPRAM HYDROBROMIDE 40 MG PO TABS: 40.0000 mg | ORAL_TABLET | Freq: Every day | ORAL | 0 refills | Status: DC

## 2021-05-28 MED ORDER — OZEMPIC (0.25 OR 0.5 MG/DOSE) 2 MG/1.5ML ~~LOC~~ SOPN: 0.2500 mg | PEN_INJECTOR | SUBCUTANEOUS | 0 refills | Status: DC

## 2021-05-28 NOTE — Assessment & Plan Note (Signed)
Continue taking Celexa 40 mg daily. 

## 2021-05-28 NOTE — Progress Notes (Signed)
? ?BP 118/74   Pulse 96   Temp 98 ?F (36.7 ?C) (Oral)   Resp 16   Ht 5\' 2"  (1.575 m)   Wt 258 lb 3.2 oz (117.1 kg)   SpO2 98%   BMI 47.23 kg/m?   ? ?Subjective:  ? ? Patient ID: Patricia Mcbride, female    DOB: Dec 21, 1980, 41 y.o.   MRN: [id number] ? ?HPI: ?Patricia Mcbride is a 41 y.o. female ? ?Chief Complaint  ?Patient presents with  ? Diabetes  ?  Metformin has caused severe diarrhea  ? ?Uncontrolled diabetes: Her last A1c was 11.6 on 04/12/2021.  She was started on metformin at that time.  She says that metformin gave her really bad diarrhea.  Discussed dropping down the dose from 2 times a day to once a day.  Also discussed starting Ozempic.  Patient is agreeable to start.  Discussed side effects and administration.  She denies any history of pancreatitis or family history of thyroid cancer.  Patient given sample in office and administered first dose. ? ?Depression/anxiety: She says her mood has been a lot better.  She is happy with the Celexa 40 mg daily.  We will continue with the current dose. ? ?  05/28/2021  ?  8:45 AM 04/12/2021  ? 10:37 AM 11/18/2018  ?  3:51 PM  ?Depression screen PHQ 2/9  ?Decreased Interest 1 1 2   ?Down, Depressed, Hopeless 1 2 1   ?PHQ - 2 Score 2 3 3   ?Altered sleeping 2 1 1   ?Tired, decreased energy 2 1 2   ?Change in appetite 1 1 3   ?Feeling bad or failure about yourself  0 0 3  ?Trouble concentrating 0 0 0  ?Moving slowly or fidgety/restless 0 0 0  ?Suicidal thoughts 0 0 0  ?PHQ-9 Score 7 6 12   ?Difficult doing work/chores Somewhat difficult Somewhat difficult Somewhat difficult  ? ? ?  05/28/2021  ?  8:48 AM 04/12/2021  ? 10:40 AM  ?GAD 7 : Generalized Anxiety Score  ?Nervous, Anxious, on Edge 1 2  ?Control/stop worrying 1 2  ?Worry too much - different things 1 2  ?Trouble relaxing  2  ?Restless 0 1  ?Easily annoyed or irritable 0 2  ?Afraid - awful might happen 1 2  ?Total GAD 7 Score  13  ?Anxiety Difficulty Somewhat difficult Somewhat difficult  ? ?URI/with  covid exposure: Reports that she has had an upper respiratory infection for the last week.  She says she has had nasal congestion and cough.  Denies any fever or shortness of breath.  She reports her symptoms have been getting better. She says she was also exposed to COVID at work.  We will get a COVID PCR. ? ?Relevant past medical, surgical, family and social history reviewed and updated as indicated. Interim medical history since our last visit reviewed. ?Allergies and medications reviewed and updated. ? ?Review of Systems ? ?Constitutional: Negative for fever or weight change.  ?HEENT: positive for nasal congestion ?Respiratory: positive for cough and negative for  shortness of breath.   ?Cardiovascular: Negative for chest pain or palpitations.  ?Gastrointestinal: Negative for abdominal pain, no bowel changes.  ?Musculoskeletal: Negative for gait problem or joint swelling.  ?Skin: Negative for rash.  ?Neurological: Negative for dizziness or headache.  ?No other specific complaints in a complete review of systems (except as listed in HPI above).  ? ?   ?Objective:  ?  ?BP 118/74   Pulse 96   Temp 98 ?  F (36.7 ?C) (Oral)   Resp 16   Ht 5\' 2"  (1.575 m)   Wt 258 lb 3.2 oz (117.1 kg)   SpO2 98%   BMI 47.23 kg/m?   ?Wt Readings from Last 3 Encounters:  ?05/28/21 258 lb 3.2 oz (117.1 kg)  ?04/12/21 262 lb 4.8 oz (119 kg)  ?01/19/19 270 lb (122.5 kg)  ?  ?Physical Exam ? ?Constitutional: Patient appears well-developed and well-nourished. Obese  No distress.  ?HEENT: head atraumatic, normocephalic, pupils equal and reactive to light, neck supple ?Cardiovascular: Normal rate, regular rhythm and normal heart sounds.  No murmur heard. No BLE edema. ?Pulmonary/Chest: Effort normal and breath sounds normal. No respiratory distress. ?Abdominal: Soft.  There is no tenderness. ?Psychiatric: Patient has a normal mood and affect. behavior is normal. Judgment and thought content normal.  ?Results for orders placed or performed  in visit on 04/12/21  ?Lipid panel  ?Result Value Ref Range  ? Cholesterol 214 (H) <200 mg/dL  ? HDL 30 (L) > OR = 50 mg/dL  ? Triglycerides 246 (H) <150 mg/dL  ? LDL Cholesterol (Calc) 144 (H) mg/dL (calc)  ? Total CHOL/HDL Ratio 7.1 (H) <5.0 (calc)  ? Non-HDL Cholesterol (Calc) 184 (H) <130 mg/dL (calc)  ?CBC with Differential/Platelet  ?Result Value Ref Range  ? WBC 12.4 (H) 3.8 - 10.8 Thousand/uL  ? RBC 4.61 3.80 - 5.10 Million/uL  ? Hemoglobin 13.4 11.7 - 15.5 g/dL  ? HCT 40.4 35.0 - 45.0 %  ? MCV 87.6 80.0 - 100.0 fL  ? MCH 29.1 27.0 - 33.0 pg  ? MCHC 33.2 32.0 - 36.0 g/dL  ? RDW 12.2 11.0 - 15.0 %  ? Platelets 367 140 - 400 Thousand/uL  ? MPV 11.2 7.5 - 12.5 fL  ? Neutro Abs 7,862 (H) 1,500 - 7,800 cells/uL  ? Lymphs Abs 3,336 850 - 3,900 cells/uL  ? Absolute Monocytes 794 200 - 950 cells/uL  ? Eosinophils Absolute 335 15 - 500 cells/uL  ? Basophils Absolute 74 0 - 200 cells/uL  ? Neutrophils Relative % 63.4 %  ? Total Lymphocyte 26.9 %  ? Monocytes Relative 6.4 %  ? Eosinophils Relative 2.7 %  ? Basophils Relative 0.6 %  ?COMPLETE METABOLIC PANEL WITH GFR  ?Result Value Ref Range  ? Glucose, Bld 262 (H) 65 - 99 mg/dL  ? BUN 10 7 - 25 mg/dL  ? Creat 0.64 0.50 - 0.97 mg/dL  ? eGFR 116 > OR = 60 mL/min/1.71m2  ? BUN/Creatinine Ratio NOT APPLICABLE 6 - 22 (calc)  ? Sodium 137 135 - 146 mmol/L  ? Potassium 4.4 3.5 - 5.3 mmol/L  ? Chloride 101 98 - 110 mmol/L  ? CO2 27 20 - 32 mmol/L  ? Calcium 9.5 8.6 - 10.2 mg/dL  ? Total Protein 7.0 6.1 - 8.1 g/dL  ? Albumin 4.4 3.6 - 5.1 g/dL  ? Globulin 2.6 1.9 - 3.7 g/dL (calc)  ? AG Ratio 1.7 1.0 - 2.5 (calc)  ? Total Bilirubin 0.4 0.2 - 1.2 mg/dL  ? Alkaline phosphatase (APISO) 108 31 - 125 U/L  ? AST 12 10 - 30 U/L  ? ALT 17 6 - 29 U/L  ?Hemoglobin A1c  ?Result Value Ref Range  ? Hgb A1c MFr Bld 11.6 (H) <5.7 % of total Hgb  ? Mean Plasma Glucose 286 mg/dL  ? eAG (mmol/L) 15.9 mmol/L  ? ?   ?Assessment & Plan:  ? ?Problem List Items Addressed This Visit   ? ?  ?  Endocrine   ? uncontrolled Type 2 diabetes mellitus with hyperglycemia, without long-term current use of insulin (HCC) - Primary  ?  Last A1c was 11.6.  Started on metformin 500 mg twice daily.  She started having really bad diarrhea.  Discussed decreasing to 500 mg once a day.  We will also add on Ozempic. ? ?  ?  ? Relevant Medications  ? metFORMIN (GLUCOPHAGE) 500 MG tablet  ? Semaglutide,0.25 or 0.5MG /DOS, (OZEMPIC, 0.25 OR 0.5 MG/DOSE,) 2 MG/1.5ML SOPN  ?  ? Other  ? Major depressive disorder, recurrent episode with anxious distress (HCC)  ?  Continue taking Celexa 40 mg daily. ? ?  ?  ? Relevant Medications  ? citalopram (CELEXA) 40 MG tablet  ? Morbid obesity (HCC)  ? Relevant Medications  ? metFORMIN (GLUCOPHAGE) 500 MG tablet  ? Semaglutide,0.25 or 0.5MG /DOS, (OZEMPIC, 0.25 OR 0.5 MG/DOSE,) 2 MG/1.5ML SOPN  ? ?Other Visit Diagnoses   ? ? Viral upper respiratory tract infection      ? She reports her symptoms are improving.  We will get COVID swab.  ? Relevant Orders  ? Novel Coronavirus, NAA ([facility])  ? Close exposure to COVID-19 virus      ? She was exposed at work.  We will get COVID swab.  ? ?  ?  ? ?Follow up plan: ?Return in about 4 weeks (around 06/25/2021) for follow up. ? ? ? ? ? ?

## 2021-05-28 NOTE — Assessment & Plan Note (Signed)
Last A1c was 11.6.  Started on metformin 500 mg twice daily.  She started having really bad diarrhea.  Discussed decreasing to 500 mg once a day.  We will also add on Ozempic. ?

## 2021-05-29 ENCOUNTER — Encounter: Payer: Self-pay | Admitting: Nurse Practitioner

## 2021-05-29 LAB — NOVEL CORONAVIRUS, NAA: SARS-CoV-2, NAA: DETECTED — AB

## 2021-05-29 LAB — SPECIMEN STATUS REPORT

## 2021-06-29 ENCOUNTER — Encounter: Payer: Self-pay | Admitting: Nurse Practitioner

## 2021-06-29 ENCOUNTER — Other Ambulatory Visit: Payer: Self-pay

## 2021-06-29 ENCOUNTER — Ambulatory Visit (INDEPENDENT_AMBULATORY_CARE_PROVIDER_SITE_OTHER): Payer: 59 | Admitting: Nurse Practitioner

## 2021-06-29 VITALS — BP 130/74 | HR 98 | Temp 98.5°F | Resp 16 | Ht 62.0 in | Wt 261.1 lb

## 2021-06-29 DIAGNOSIS — F339 Major depressive disorder, recurrent, unspecified: Secondary | ICD-10-CM | POA: Diagnosis not present

## 2021-06-29 DIAGNOSIS — E1165 Type 2 diabetes mellitus with hyperglycemia: Secondary | ICD-10-CM | POA: Diagnosis not present

## 2021-06-29 MED ORDER — NOVOFINE PEN NEEDLE 32G X 6 MM MISC: 1.0000 | 0 refills | Status: DC

## 2021-06-29 MED ORDER — OZEMPIC (0.25 OR 0.5 MG/DOSE) 2 MG/1.5ML ~~LOC~~ SOPN: 0.5000 mg | PEN_INJECTOR | SUBCUTANEOUS | 0 refills | Status: DC

## 2021-06-29 MED ORDER — CITALOPRAM HYDROBROMIDE 40 MG PO TABS: 40.0000 mg | ORAL_TABLET | Freq: Every day | ORAL | 0 refills | Status: DC

## 2021-06-29 MED ORDER — BUPROPION HCL ER (XL) 150 MG PO TB24: 150.0000 mg | ORAL_TABLET | Freq: Every day | ORAL | 0 refills | Status: DC

## 2021-06-29 NOTE — Progress Notes (Addendum)
BP 130/74   Pulse 98   Temp 98.5 F (36.9 C) (Oral)   Resp 16   Ht 5\' 2"  (1.575 m)   Wt 261 lb 1.6 oz (118.4 kg)   SpO2 98%   BMI 47.76 kg/m    Subjective:    Patient ID: Patricia Mcbride, female    DOB: 22-Mar-1980, 41 y.o.   MRN: [id number]  HPI: Patricia Mcbride is a 41 y.o. female  Chief Complaint  Patient presents with   Follow-up    4 week recheck diabetes   DM: She is here for 4-week follow-up after starting Ozempic.  Her last A1c was 11.6 on 04/12/2021.  We started her on metformin at that time.  She was unable to tolerate it very well due to it given her terrible diarrhea.  We decreased her dose from 2 times a day to once a day started her on Ozempic 0.25 mg a week.  Patient also reports that her diarrhea has stopped.  She is now tolerating the metformin once a day.  Patient has tolerated Ozempic.  Patient denies any polyuria, polyphasia, or polydipsia.  Patient also has not had any hypoglycemic episodes.  Will increase dose to 0.5 mg a week.   Depression/anxiety: Patient reports that her mood has gotten worse since her last visit.  Patient denies any suicidal thoughts.  Patient states she is just not motivated, has no sex drive just feels blue.  Patient has been taking Celexa 40 mg daily.  Discussed adding on Wellbutrin.  Patient is agreeable to plan.  Patient will follow-up in 4 weeks.  Patient reported back that she had actually been taking 80 mg of Celexa.  Discussed with patient that she needs to take 40 mg daily.  She is going to drop down to the 40 mg daily over the next week.  And then start Wellbutrin if needed.    06/29/2021    1:49 PM 05/28/2021    8:45 AM 04/12/2021   10:37 AM 11/18/2018    3:51 PM  Depression screen PHQ 2/9  Decreased Interest 2 1 1 2   Down, Depressed, Hopeless 2 1 2 1   PHQ - 2 Score 4 2 3 3   Altered sleeping 0 2 1 1   Tired, decreased energy 2 2 1 2   Change in appetite 2 1 1 3   Feeling bad or failure about yourself  0 0 0 3  Trouble  concentrating 0 0 0 0  Moving slowly or fidgety/restless 0 0 0 0  Suicidal thoughts 0 0 0 0  PHQ-9 Score 8 7 6 12   Difficult doing work/chores Somewhat difficult Somewhat difficult Somewhat difficult Somewhat difficult       06/29/2021    1:53 PM 05/28/2021    8:48 AM 04/12/2021   10:40 AM  GAD 7 : Generalized Anxiety Score  Nervous, Anxious, on Edge 1 1 2   Control/stop worrying 1 1 2   Worry too much - different things 1 1 2   Trouble relaxing 0  2  Restless 0 0 1  Easily annoyed or irritable 1 0 2  Afraid - awful might happen 1 1 2   Total GAD 7 Score 5  13  Anxiety Difficulty Somewhat difficult Somewhat difficult Somewhat difficult     Relevant past medical, surgical, family and social history reviewed and updated as indicated. Interim medical history since our last visit reviewed. Allergies and medications reviewed and updated.  Review of Systems  Constitutional: Negative for fever or weight change.  Respiratory: Negative for cough and shortness of breath.   Cardiovascular: Negative for chest pain or palpitations.  Gastrointestinal: Negative for abdominal pain, no bowel changes.  Musculoskeletal: Negative for gait problem or joint swelling.  Skin: Negative for rash.  Neurological: Negative for dizziness or headache.  No other specific complaints in a complete review of systems (except as listed in HPI above).      Objective:    BP 130/74   Pulse 98   Temp 98.5 F (36.9 C) (Oral)   Resp 16   Ht 5\' 2"  (1.575 m)   Wt 261 lb 1.6 oz (118.4 kg)   SpO2 98%   BMI 47.76 kg/m   Wt Readings from Last 3 Encounters:  06/29/21 261 lb 1.6 oz (118.4 kg)  05/28/21 258 lb 3.2 oz (117.1 kg)  04/12/21 262 lb 4.8 oz (119 kg)    Physical Exam  Constitutional: Patient appears well-developed and well-nourished. Obese  No distress.  HEENT: head atraumatic, normocephalic, pupils equal and reactive to light, neck supple Cardiovascular: Normal rate, regular rhythm and normal heart sounds.   No murmur heard. No BLE edema. Pulmonary/Chest: Effort normal and breath sounds normal. No respiratory distress. Abdominal: Soft.  There is no tenderness. Psychiatric: Patient has a normal mood and affect. behavior is normal. Judgment and thought content normal.     Assessment & Plan:   Problem List Items Addressed This Visit       Endocrine   uncontrolled Type 2 diabetes mellitus with hyperglycemia, without long-term current use of insulin (HCC) - Primary    Last A1c was 11.6 on 04/12/2021.  Patient is currently taking metformin 500 mg once a day and Ozempic 0.25 mg weekly.  Increasing patient to 0.5 mg weekly.      Relevant Medications   Semaglutide,0.25 or 0.5MG /DOS, (OZEMPIC, 0.25 OR 0.5 MG/DOSE,) 2 MG/1.5ML SOPN   Insulin Pen Needle (NOVOFINE PEN NEEDLE) 32G X 6 MM MISC     Other   Major depressive disorder, recurrent episode with anxious distress [facility])    Patient reports that her mood has gotten worse.  She denies any suicidal thoughts.  She just says she feels less motivated, has no sex drive and just feels blue.  Discussed adding on Wellbutrin to her Celexa 40 mg daily.  Patient is agreement with plan.      Relevant Medications   buPROPion (WELLBUTRIN XL) 150 MG 24 hr tablet     Follow up plan: Return in about 4 weeks (around 07/27/2021) for follow up with PAP.

## 2021-06-29 NOTE — Addendum Note (Signed)
Addended by: Serafina Royals F on: 06/29/2021 03:21 PM   Modules accepted: Orders

## 2021-06-29 NOTE — Assessment & Plan Note (Signed)
Patient reports that her mood has gotten worse.  She denies any suicidal thoughts.  She just says she feels less motivated, has no sex drive and just feels blue.  Discussed adding on Wellbutrin to her Celexa 40 mg daily.  Patient is agreement with plan.

## 2021-06-29 NOTE — Assessment & Plan Note (Signed)
Last A1c was 11.6 on 04/12/2021.  Patient is currently taking metformin 500 mg once a day and Ozempic 0.25 mg weekly.  Increasing patient to 0.5 mg weekly.

## 2021-07-26 ENCOUNTER — Other Ambulatory Visit: Payer: Self-pay | Admitting: Nurse Practitioner

## 2021-07-26 DIAGNOSIS — F339 Major depressive disorder, recurrent, unspecified: Secondary | ICD-10-CM

## 2021-07-26 NOTE — Telephone Encounter (Signed)
Requested Prescriptions  Pending Prescriptions Disp Refills  . buPROPion (WELLBUTRIN XL) 150 MG 24 hr tablet [Pharmacy Med Name: BUPROPION XL 150MG  TABLETS (24 H)] 30 tablet 0    Sig: TAKE 1 TABLET(150 MG) BY MOUTH DAILY     Psychiatry: Antidepressants - bupropion Passed - 07/26/2021  7:57 AM      Passed - Cr in normal range and within 360 days    Creat  Date Value Ref Range Status  04/12/2021 0.64 0.50 - 0.97 mg/dL Final         Passed - AST in normal range and within 360 days    AST  Date Value Ref Range Status  04/12/2021 12 10 - 30 U/L Final         Passed - ALT in normal range and within 360 days    ALT  Date Value Ref Range Status  04/12/2021 17 6 - 29 U/L Final         Passed - Completed PHQ-2 or PHQ-9 in the last 360 days      Passed - Last BP in normal range    BP Readings from Last 1 Encounters:  06/29/21 130/74         Passed - Valid encounter within last 6 months    Recent Outpatient Visits          3 weeks ago uncontrolled Type 2 diabetes mellitus with hyperglycemia, without long-term current use of insulin [facility])   [facility] [place] F, FNP   1 month ago uncontrolled Type 2 diabetes mellitus with hyperglycemia, without long-term current use of insulin [facility])   [facility] F, FNP   3 months ago Type 2 diabetes mellitus with hyperglycemia, without long-term current use of insulin [facility])   [facility], FNP      Future Appointments            In 2 weeks Berniece Salines, Zane Herald, FNP [facility], [place]

## 2021-08-10 ENCOUNTER — Ambulatory Visit (INDEPENDENT_AMBULATORY_CARE_PROVIDER_SITE_OTHER): Payer: 59 | Admitting: Nurse Practitioner

## 2021-08-10 ENCOUNTER — Encounter: Payer: Self-pay | Admitting: Nurse Practitioner

## 2021-08-10 ENCOUNTER — Other Ambulatory Visit (HOSPITAL_COMMUNITY)
Admission: RE | Admit: 2021-08-10 | Discharge: 2021-08-10 | Disposition: A | Discharge status: Home / Self Care | Payer: 59 | Source: Ambulatory Visit | Attending: Nurse Practitioner | Admitting: Nurse Practitioner

## 2021-08-10 VITALS — BP 122/84 | HR 98 | Temp 98.7°F | Resp 16 | Ht 62.0 in | Wt 261.2 lb

## 2021-08-10 DIAGNOSIS — F339 Major depressive disorder, recurrent, unspecified: Secondary | ICD-10-CM

## 2021-08-10 DIAGNOSIS — Z124 Encounter for screening for malignant neoplasm of cervix: Secondary | ICD-10-CM

## 2021-08-10 DIAGNOSIS — E1165 Type 2 diabetes mellitus with hyperglycemia: Secondary | ICD-10-CM

## 2021-08-10 LAB — POCT GLYCOSYLATED HEMOGLOBIN (HGB A1C): Hemoglobin A1C: 6.5 % — AB (ref 4.0–5.6)

## 2021-08-10 MED ORDER — BUPROPION HCL ER (XL) 150 MG PO TB24: ORAL_TABLET | ORAL | 0 refills | Status: DC

## 2021-08-10 MED ORDER — SEMAGLUTIDE (1 MG/DOSE) 4 MG/3ML ~~LOC~~ SOPN: 1.0000 mg | PEN_INJECTOR | SUBCUTANEOUS | 0 refills | Status: DC

## 2021-08-10 NOTE — Assessment & Plan Note (Signed)
Continue taking Wellbutrin 150 mg daily.  Patient is doing well on this current dose.

## 2021-08-10 NOTE — Progress Notes (Signed)
BP 122/84   Pulse 98   Temp 98.7 F (37.1 C)   Resp 16   Ht 5\' 2"  (1.575 m)   Wt 261 lb 3.2 oz (118.5 kg)   LMP 07/30/2021   SpO2 99%   BMI 47.77 kg/m    Subjective:    Patient ID: Patricia Mcbride, female    DOB: 09/15/1980, 41 y.o.   MRN: [id number]  HPI: Patricia Mcbride is a 41 y.o. female  Chief Complaint  Patient presents with   Follow-up    W/ pap   Diabetes   Depression   Depression: She was last seen on 06/29/2021.  She was prescribed Celexa 40 mg daily.  Patient reported that she felt like her depression had gotten worse.  Discussed decreasing Celexa and weaning off and then starting Wellbutrin.  Patient arrived home she realized that she had actually been taking 80 mg of Celexa.  Could have also been the cause of her feelings.  Patient worked on decreasing her Celexa and then started Wellbutrin.  She is currently taking Wellbutrin 150 mg daily.    08/10/2021    8:48 AM 06/29/2021    1:49 PM 05/28/2021    8:45 AM 04/12/2021   10:37 AM 11/18/2018    3:51 PM  Depression screen PHQ 2/9  Decreased Interest 0 2 1 1 2   Down, Depressed, Hopeless 0 2 1 2 1   PHQ - 2 Score 0 4 2 3 3   Altered sleeping 0 0 2 1 1   Tired, decreased energy 0 2 2 1 2   Change in appetite 0 2 1 1 3   Feeling bad or failure about yourself  0 0 0 0 3  Trouble concentrating 0 0 0 0 0  Moving slowly or fidgety/restless 0 0 0 0 0  Suicidal thoughts 0 0 0 0 0  PHQ-9 Score 0 8 7 6 12   Difficult doing work/chores Not difficult at all Somewhat difficult Somewhat difficult Somewhat difficult Somewhat difficult       08/10/2021    8:48 AM 06/29/2021    1:53 PM 05/28/2021    8:48 AM 04/12/2021   10:40 AM  GAD 7 : Generalized Anxiety Score  Nervous, Anxious, on Edge 0 1 1 2   Control/stop worrying 0 1 1 2   Worry too much - different things 0 1 1 2   Trouble relaxing 0 0  2  Restless 0 0 0 1  Easily annoyed or irritable 0 1 0 2  Afraid - awful might happen 0 1 1 2   Total GAD 7 Score 0 5  13   Anxiety Difficulty Not difficult at all Somewhat difficult Somewhat difficult Somewhat difficult     Diabetes: Her last A1c was 11.6 on 04/12/2021.  Her A1C today is 6.5! She is currently taking metformin 500 mg at bedtime and Ozempic 0.5 mg weekly.  She says she has not been checking her blood sugar.  We will get a repeat A1c today.  She is also due for microalbumin urine.  Denies any polyuria, polydipsia or polyphasia.  Her foot and eye exam are up-to-date.  Relevant past medical, surgical, family and social history reviewed and updated as indicated. Interim medical history since our last visit reviewed. Allergies and medications reviewed and updated.  Review of Systems  Constitutional: Negative for fever or weight change.  Respiratory: Negative for cough and shortness of breath.   Cardiovascular: Negative for chest pain or palpitations.  Gastrointestinal: Negative for abdominal pain, no bowel changes.  Musculoskeletal: Negative for gait problem or joint swelling.  Skin: Negative for rash.  Neurological: Negative for dizziness or headache.  No other specific complaints in a complete review of systems (except as listed in HPI above).      Objective:    BP 122/84   Pulse 98   Temp 98.7 F (37.1 C)   Resp 16   Ht 5\' 2"  (1.575 m)   Wt 261 lb 3.2 oz (118.5 kg)   LMP 07/30/2021   SpO2 99%   BMI 47.77 kg/m   Wt Readings from Last 3 Encounters:  08/10/21 261 lb 3.2 oz (118.5 kg)  06/29/21 261 lb 1.6 oz (118.4 kg)  05/28/21 258 lb 3.2 oz (117.1 kg)    Physical Exam   Constitutional: Patient appears well-developed and well-nourished. Obese  No distress.  HEENT: head atraumatic, normocephalic, pupils equal and reactive to light, neck supple Cardiovascular: Normal rate, regular rhythm and normal heart sounds.  No murmur heard. No BLE edema. Pulmonary/Chest: Effort normal and breath sounds normal. No respiratory distress. Abdominal: Soft.  There is no tenderness. Psychiatric:  Patient has a normal mood and affect. behavior is normal. Judgment and thought content normal.  Pelvic exam: normal external genitalia, vulva, vagina, cervix, uterus and adnexa.  Results for orders placed or performed in visit on 08/10/21  POCT HgB A1C  Result Value Ref Range   Hemoglobin A1C 6.5 (A) 4.0 - 5.6 %   HbA1c POC (<> result, manual entry)     HbA1c, POC (prediabetic range)     HbA1c, POC (controlled diabetic range)        Assessment & Plan:   Problem List Items Addressed This Visit       Endocrine   Type 2 diabetes mellitus with hyperglycemia, without long-term current use of insulin (HCC)    Patient currently taking Ozempic 0.5 mg weekly and metformin 500 mg at bedtime.  We will increase Ozempic dose to 1 mg weekly.  Patient's A1c was 11.6 on 04/12/2021.  Her A1c today was 6.5.  Get microalbumin urine today.      Relevant Medications   Semaglutide, 1 MG/DOSE, 4 MG/3ML SOPN   Other Relevant Orders   POCT HgB A1C (Completed)   Urine Microalbumin w/creat. ratio     Other   Major depressive disorder, recurrent episode with anxious distress (HCC) - Primary    Continue taking Wellbutrin 150 mg daily.  Patient is doing well on this current dose.      Relevant Medications   buPROPion (WELLBUTRIN XL) 150 MG 24 hr tablet   Other Visit Diagnoses     Screening for cervical cancer       Pap completed   Relevant Orders   Cytology - PAP        Follow up plan: Return in about 3 months (around 11/10/2021) for follow up.

## 2021-08-10 NOTE — Assessment & Plan Note (Signed)
Patient currently taking Ozempic 0.5 mg weekly and metformin 500 mg at bedtime.  We will increase Ozempic dose to 1 mg weekly.  Patient's A1c was 11.6 on 04/12/2021.  Her A1c today was 6.5.  Get microalbumin urine today.

## 2021-08-11 LAB — MICROALBUMIN / CREATININE URINE RATIO
Creatinine, Urine: 159 mg/dL (ref 20–275)
Microalb Creat Ratio: 2 mcg/mg creat (ref <30)
Microalb, Ur: 0.3 mg/dL

## 2021-08-14 LAB — CYTOLOGY - PAP
Comment: NEGATIVE
Diagnosis: NEGATIVE
Diagnosis: REACTIVE
High risk HPV: NEGATIVE

## 2021-08-15 ENCOUNTER — Other Ambulatory Visit: Payer: Self-pay | Admitting: Nurse Practitioner

## 2021-08-21 ENCOUNTER — Other Ambulatory Visit: Payer: Self-pay | Admitting: Nurse Practitioner

## 2021-08-21 DIAGNOSIS — F339 Major depressive disorder, recurrent, unspecified: Secondary | ICD-10-CM

## 2021-08-21 DIAGNOSIS — E1165 Type 2 diabetes mellitus with hyperglycemia: Secondary | ICD-10-CM

## 2021-09-02 ENCOUNTER — Other Ambulatory Visit: Payer: Self-pay | Admitting: Nurse Practitioner

## 2021-09-02 DIAGNOSIS — E1165 Type 2 diabetes mellitus with hyperglycemia: Secondary | ICD-10-CM

## 2021-09-03 MED ORDER — SEMAGLUTIDE (1 MG/DOSE) 4 MG/3ML ~~LOC~~ SOPN
1.0000 mg | PEN_INJECTOR | SUBCUTANEOUS | 0 refills | Status: DC
Start: 2021-09-03 — End: 2021-10-24

## 2021-09-23 ENCOUNTER — Other Ambulatory Visit: Payer: Self-pay | Admitting: Nurse Practitioner

## 2021-09-23 DIAGNOSIS — F339 Major depressive disorder, recurrent, unspecified: Secondary | ICD-10-CM

## 2021-09-25 NOTE — Telephone Encounter (Signed)
med dc'd 08/10/21 Change in therapy [name] FNP.  Requested Prescriptions  Refused Prescriptions Disp Refills  . citalopram (CELEXA) 40 MG tablet [Pharmacy Med Name: CITALOPRAM 40MG  TABLETS] 90 tablet 0    Sig: TAKE 1 TABLET(40 MG) BY MOUTH DAILY     Psychiatry:  Antidepressants - SSRI Passed - 09/23/2021  3:44 AM      Passed - Completed PHQ-2 or PHQ-9 in the last 360 days      Passed - Valid encounter within last 6 months    Recent Outpatient Visits          1 month ago Major depressive disorder, recurrent episode with anxious distress [facility])   [facility] F, FNP   2 months ago uncontrolled Type 2 diabetes mellitus with hyperglycemia, without long-term current use of insulin [facility])   [facility] F, FNP   4 months ago uncontrolled Type 2 diabetes mellitus with hyperglycemia, without long-term current use of insulin [facility])   [facility] F, FNP   5 months ago Type 2 diabetes mellitus with hyperglycemia, without long-term current use of insulin [facility])   [facility], FNP      Future Appointments            In 1 month Berniece Salines, Zane Herald, FNP [facility], [facility]

## 2021-10-02 ENCOUNTER — Ambulatory Visit: Payer: Self-pay | Admitting: *Deleted

## 2021-10-02 NOTE — Telephone Encounter (Signed)
   Answer Assessment - Initial Assessment Questions 1. REAON FOR Patricia Mcbride or QUESTION: "What is your reason for calling today?" or "How can I best help you?" or "What question do you have that I can help answer?"     Pregnant, wants meds reviewed.  Protocols used: Information Only [place] - No Triage-A-AH  Pt called, has appt with [name], FNP tomorrow. She is pregnant and worried about her meds. She is on Ozempic, Metformin, Welbutrin and she said Citalopram. I do not see Citalopram on her current med list.It was discontinued 08/10/21. Pt's appt is tomorrow morning but she does not know if she should take these meds today. Please advise.

## 2021-10-03 ENCOUNTER — Encounter: Payer: Self-pay | Admitting: Nurse Practitioner

## 2021-10-03 ENCOUNTER — Ambulatory Visit (INDEPENDENT_AMBULATORY_CARE_PROVIDER_SITE_OTHER): Payer: 59 | Admitting: Nurse Practitioner

## 2021-10-03 ENCOUNTER — Other Ambulatory Visit: Payer: Self-pay

## 2021-10-03 VITALS — BP 124/82 | HR 98 | Temp 98.5°F | Resp 16 | Ht 62.0 in | Wt 263.7 lb

## 2021-10-03 DIAGNOSIS — Z3201 Encounter for pregnancy test, result positive: Secondary | ICD-10-CM | POA: Diagnosis not present

## 2021-10-03 DIAGNOSIS — K219 Gastro-esophageal reflux disease without esophagitis: Secondary | ICD-10-CM | POA: Diagnosis not present

## 2021-10-03 DIAGNOSIS — F339 Major depressive disorder, recurrent, unspecified: Secondary | ICD-10-CM | POA: Diagnosis not present

## 2021-10-03 DIAGNOSIS — E1165 Type 2 diabetes mellitus with hyperglycemia: Secondary | ICD-10-CM | POA: Diagnosis not present

## 2021-10-03 LAB — POCT URINE PREGNANCY: Preg Test, Ur: POSITIVE — AB

## 2021-10-03 MED ORDER — GVOKE HYPOPEN 1-PACK 1 MG/0.2ML ~~LOC~~ SOAJ: 1.0000 mg | SUBCUTANEOUS | 3 refills | Status: DC | PRN

## 2021-10-03 MED ORDER — INSULIN GLARGINE (1 UNIT DIAL) 300 UNIT/ML ~~LOC~~ SOPN: 10.0000 [IU] | PEN_INJECTOR | Freq: Every day | SUBCUTANEOUS | 3 refills | Status: DC

## 2021-10-03 MED ORDER — BUPROPION HCL ER (XL) 150 MG PO TB24: ORAL_TABLET | ORAL | 3 refills | Status: DC

## 2021-10-03 NOTE — Assessment & Plan Note (Signed)
Hold pantoprazole for now.  Take Tums as needed for acid reflux.  Seek guidance from OB/GYN.

## 2021-10-03 NOTE — Assessment & Plan Note (Signed)
Start to wean off Celexa.  Start taking half a tablet daily for the next 2 weeks and then can stop completely.  Continue taking Wellbutrin 150 mg daily.  Continue to monitor mental health.

## 2021-10-03 NOTE — Assessment & Plan Note (Signed)
Continue taking metformin 500 mg at bedtime.  Stop taking Ozempic.  Start Toujeo 10 units daily.  Hypopen sent in for emergencies of hypoglycemia.

## 2021-10-03 NOTE — Progress Notes (Signed)
BP 124/82   Pulse 98   Temp 98.5 F (36.9 C) (Oral)   Resp 16   Ht 5\' 2"  (1.575 m)   Wt 263 lb 11.2 oz (119.6 kg)   LMP 08/24/2021   SpO2 99%   BMI 48.23 kg/m    Subjective:    Patient ID: Patricia Mcbride, female    DOB: 1980-10-17, 41 y.o.   MRN: [id number]  HPI: Patricia Mcbride is a 41 y.o. female  Chief Complaint  Patient presents with   Routine Prenatal Visit    3 positive pregnancy test. Need to discuss medication   Positive pregnancy test:  Patient reports she has had three positive pregnancy tests. LMC: ended 08/27/2021.  She is experiencing nausea, and tender breasts.  Patient states she has started taking prenatal vitamins.  She is here to discuss her prescription medications and which ones are safe in pregnancy.  This will be her first pregnancy.  Get urine hCG and beta quant.  Placed referral for OB/GYN.  Diabetes: Last dose of ozempic was on Sunday.  Discussed with patient that Ozempic is not appropriate during pregnancy.  Recommended stopping Ozempic.  Since patient's diabetes was not controlled with metformin discussed starting insulin.  Patient is good to start taking Toujeo 10 units daily.  Discussed signs and symptoms of hypoglycemia.  Also sent in hypopen for emergencies.  Patient's last A1c was 6.5 on 08/10/2021.  Down from 11.6.   Depression: Patient was taking Celexa 40 mg daily and Wellbutrin 150 mg daily.  Discussed stopping Celexa due to pregnancy. Discussed risk vs benefit.  Patient agrees to stop celexa, she is going to decrease dose to 20 mg for two weeks and then stop taking it. We will continue with Wellbutrin 150 mg daily.  Discussed with patient keeping an eye on her mental health that it is important.  And to let me know if she has any issues.    10/03/2021    8:07 AM 08/10/2021    8:48 AM 06/29/2021    1:49 PM 05/28/2021    8:45 AM 04/12/2021   10:37 AM  Depression screen PHQ 2/9  Decreased Interest 0 0 2 1 1   Down, Depressed, Hopeless 0 0 2 1  2   PHQ - 2 Score 0 0 4 2 3   Altered sleeping 1 0 0 2 1  Tired, decreased energy 0 0 2 2 1   Change in appetite 0 0 2 1 1   Feeling bad or failure about yourself  0 0 0 0 0  Trouble concentrating 0 0 0 0 0  Moving slowly or fidgety/restless 0 0 0 0 0  Suicidal thoughts 0 0 0 0 0  PHQ-9 Score 1 0 8 7 6   Difficult doing work/chores  Not difficult at all Somewhat difficult Somewhat difficult Somewhat difficult       10/03/2021    8:07 AM 08/10/2021    8:48 AM 06/29/2021    1:53 PM 05/28/2021    8:48 AM  GAD 7 : Generalized Anxiety Score  Nervous, Anxious, on Edge 1 0 1 1  Control/stop worrying 0 0 1 1  Worry too much - different things 1 0 1 1  Trouble relaxing 0 0 0   Restless 0 0 0 0  Easily annoyed or irritable 0 0 1 0  Afraid - awful might happen 0 0 1 1  Total GAD 7 Score 2 0 5   Anxiety Difficulty Not difficult at all Not difficult at all  Somewhat difficult Somewhat difficult    GERD: Discussed with patient stopping pantoprazole for now due to pregnancy.  Patient is going to discuss this with OB/GYN for their input.  For now patient can take Tums for her acid reflux.  Relevant past medical, surgical, family and social history reviewed and updated as indicated. Interim medical history since our last visit reviewed. Allergies and medications reviewed and updated.  Review of Systems Constitutional: Negative for fever or weight change.  Respiratory: Negative for cough and shortness of breath.   Cardiovascular: Negative for chest pain or palpitations.  Gastrointestinal: Negative for abdominal pain, no bowel changes.  Musculoskeletal: Negative for gait problem or joint swelling.  Skin: Negative for rash.  Neurological: Negative for dizziness or headache.  No other specific complaints in a complete review of systems (except as listed in HPI above).      Objective:    BP 124/82   Pulse 98   Temp 98.5 F (36.9 C) (Oral)   Resp 16   Ht 5\' 2"  (1.575 m)   Wt 263 lb 11.2 oz (119.6  kg)   LMP 08/24/2021   SpO2 99%   BMI 48.23 kg/m   Wt Readings from Last 3 Encounters:  10/03/21 263 lb 11.2 oz (119.6 kg)  08/10/21 261 lb 3.2 oz (118.5 kg)  06/29/21 261 lb 1.6 oz (118.4 kg)    Physical Exam  Constitutional: Patient appears well-developed and well-nourished. Obese  No distress.  HEENT: head atraumatic, normocephalic, pupils equal and reactive to light, neck supple Cardiovascular: Normal rate, regular rhythm and normal heart sounds.  No murmur heard. No BLE edema. Pulmonary/Chest: Effort normal and breath sounds normal. No respiratory distress. Abdominal: Soft.  There is no tenderness. Psychiatric: Patient has a normal mood and affect. behavior is normal. Judgment and thought content normal.  Results for orders placed or performed in visit on 10/03/21  POCT urine pregnancy  Result Value Ref Range   Preg Test, Ur Positive (A) Negative      Assessment & Plan:   Problem List Items Addressed This Visit       Digestive   Gastroesophageal reflux disease without esophagitis    Hold pantoprazole for now.  Take Tums as needed for acid reflux.  Seek guidance from OB/GYN.        Endocrine   Type 2 diabetes mellitus with hyperglycemia, without long-term current use of insulin (HCC)    Continue taking metformin 500 mg at bedtime.  Stop taking Ozempic.  Start Toujeo 10 units daily.  Hypopen sent in for emergencies of hypoglycemia.      Relevant Medications   insulin glargine, 1 Unit Dial, (TOUJEO) 300 UNIT/ML Solostar Pen   Glucagon (GVOKE HYPOPEN 1-PACK) 1 MG/0.2ML SOAJ     Other   Major depressive disorder, recurrent episode with anxious distress (HCC)    Start to wean off Celexa.  Start taking half a tablet daily for the next 2 weeks and then can stop completely.  Continue taking Wellbutrin 150 mg daily.  Continue to monitor mental health.      Relevant Medications   buPROPion (WELLBUTRIN XL) 150 MG 24 hr tablet   Other Visit Diagnoses     Positive  pregnancy test    -  Primary    Her pregnancy test here was positive we will get beta quant and refer to OB/GYN.  Patient is already taking prenatal vitamins   Relevant Orders   Ambulatory referral to Gynecology   Beta HCG, Quant  POCT urine pregnancy (Completed)        Follow up plan: Return for Has appointment already scheduled.Marland Kitchen

## 2021-10-04 LAB — BETA HCG QUANT (REF LAB): hCG Quant: 2946 m[IU]/mL

## 2021-10-10 ENCOUNTER — Ambulatory Visit: Payer: 59

## 2021-10-24 ENCOUNTER — Ambulatory Visit: Payer: 59

## 2021-10-24 ENCOUNTER — Ambulatory Visit (INDEPENDENT_AMBULATORY_CARE_PROVIDER_SITE_OTHER): Payer: 59

## 2021-10-24 VITALS — Wt 260.0 lb

## 2021-10-24 DIAGNOSIS — O0990 Supervision of high risk pregnancy, unspecified, unspecified trimester: Secondary | ICD-10-CM | POA: Insufficient documentation

## 2021-10-24 DIAGNOSIS — E119 Type 2 diabetes mellitus without complications: Secondary | ICD-10-CM

## 2021-10-24 DIAGNOSIS — Z369 Encounter for antenatal screening, unspecified: Secondary | ICD-10-CM

## 2021-10-24 DIAGNOSIS — Z3689 Encounter for other specified antenatal screening: Secondary | ICD-10-CM

## 2021-10-24 DIAGNOSIS — Z3480 Encounter for supervision of other normal pregnancy, unspecified trimester: Secondary | ICD-10-CM

## 2021-10-24 NOTE — Progress Notes (Signed)
New OB Intake  I connected with  Patricia Mcbride on 10/24/21 at  8:15 AM EDT by telephone and verified that I am speaking with the correct person using two identifiers. Nurse is located at [employer] and pt is located at work.  I explained I am completing New OB Intake today. We discussed her EDD of 05/21/2022 that is based on LMP of 08/14/2021. Pt is G3/P0020. I reviewed her allergies, medications, Medical/Surgical/OB history, and appropriate screenings. Based on history, this is a/an pregnancy uncomplicated .   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  Cried while telling me her OB hx.  Delivery Plans:  Plans to deliver at [facility].  Anatomy US Explained first scheduled [place] will be around 20 weeks. Pt states she has had an u/s elsewhere; adv to bring results with her.  Labs Pt states she has had genetic testing done and is having a girl; adv to bring results with her. Discussed possible labs to be drawn at lab appointment.  COVID Vaccine Patient has had COVID vaccine.   Social Determinants of Health Food Insecurity: denies food insecurity Transportation: Patient denies transportation needs.  First visit review I reviewed new OB appt with pt. I explained she will have ob bloodwork and pap smear/pelvic exam if  indicated. Explained pt will be seen by Gigi Gin, CNM at first visit; encounter routed to appropriate provider.   Cleophas Dunker, [place] 10/24/2021  8:45 AM

## 2021-11-02 ENCOUNTER — Other Ambulatory Visit: Payer: 59

## 2021-11-02 DIAGNOSIS — Z3480 Encounter for supervision of other normal pregnancy, unspecified trimester: Secondary | ICD-10-CM

## 2021-11-02 DIAGNOSIS — E119 Type 2 diabetes mellitus without complications: Secondary | ICD-10-CM

## 2021-11-02 DIAGNOSIS — Z369 Encounter for antenatal screening, unspecified: Secondary | ICD-10-CM

## 2021-11-02 LAB — OB RESULTS CONSOLE VARICELLA ZOSTER ANTIBODY, IGG: Varicella: IMMUNE

## 2021-11-03 LAB — CBC/D/PLT+RPR+RH+ABO+RUBIGG...
Antibody Screen: NEGATIVE
Basophils Absolute: 0.1 10*3/uL (ref 0.0–0.2)
Basos: 1 %
EOS (ABSOLUTE): 0.2 10*3/uL (ref 0.0–0.4)
Eos: 2 %
HCV Ab: NONREACTIVE
HIV Screen 4th Generation wRfx: NONREACTIVE
Hematocrit: 37.4 % (ref 34.0–46.6)
Hemoglobin: 12.5 g/dL (ref 11.1–15.9)
Hepatitis B Surface Ag: NEGATIVE
Immature Grans (Abs): 0 10*3/uL (ref 0.0–0.1)
Immature Granulocytes: 0 %
Lymphocytes Absolute: 2.7 10*3/uL (ref 0.7–3.1)
Lymphs: 24 %
MCH: 28.6 pg (ref 26.6–33.0)
MCHC: 33.4 g/dL (ref 31.5–35.7)
MCV: 86 fL (ref 79–97)
Monocytes Absolute: 0.7 10*3/uL (ref 0.1–0.9)
Monocytes: 6 %
Neutrophils Absolute: 7.9 10*3/uL — ABNORMAL HIGH (ref 1.4–7.0)
Neutrophils: 67 %
Platelets: 346 10*3/uL (ref 150–450)
RBC: 4.37 x10E6/uL (ref 3.77–5.28)
RDW: 14.1 % (ref 11.7–15.4)
RPR Ser Ql: NONREACTIVE
Rh Factor: POSITIVE
Rubella Antibodies, IGG: 7.91 index (ref >0.99)
Varicella zoster IgG: 564 index (ref >165)
WBC: 11.7 10*3/uL — ABNORMAL HIGH (ref 3.4–10.8)

## 2021-11-03 LAB — HCV INTERPRETATION

## 2021-11-03 LAB — HEMOGLOBIN A1C
Est. average glucose Bld gHb Est-mCnc: 151 mg/dL
Hgb A1c MFr Bld: 6.9 % — ABNORMAL HIGH (ref 4.8–5.6)

## 2021-11-16 ENCOUNTER — Ambulatory Visit: Payer: 59 | Admitting: Nurse Practitioner

## 2021-11-16 ENCOUNTER — Other Ambulatory Visit (HOSPITAL_COMMUNITY)
Admission: RE | Admit: 2021-11-16 | Discharge: 2021-11-16 | Disposition: A | Discharge status: Home / Self Care | Payer: 59 | Source: Ambulatory Visit | Attending: Obstetrics | Admitting: Obstetrics

## 2021-11-16 ENCOUNTER — Encounter: Payer: Self-pay | Admitting: Obstetrics

## 2021-11-16 ENCOUNTER — Ambulatory Visit (INDEPENDENT_AMBULATORY_CARE_PROVIDER_SITE_OTHER): Payer: 59 | Admitting: Obstetrics

## 2021-11-16 VITALS — BP 123/85 | HR 109 | Wt 265.0 lb

## 2021-11-16 DIAGNOSIS — Z113 Encounter for screening for infections with a predominantly sexual mode of transmission: Secondary | ICD-10-CM | POA: Insufficient documentation

## 2021-11-16 DIAGNOSIS — O99341 Other mental disorders complicating pregnancy, first trimester: Secondary | ICD-10-CM

## 2021-11-16 DIAGNOSIS — O0990 Supervision of high risk pregnancy, unspecified, unspecified trimester: Secondary | ICD-10-CM | POA: Insufficient documentation

## 2021-11-16 DIAGNOSIS — Z1379 Encounter for other screening for genetic and chromosomal anomalies: Secondary | ICD-10-CM

## 2021-11-16 DIAGNOSIS — F320 Major depressive disorder, single episode, mild: Secondary | ICD-10-CM

## 2021-11-16 DIAGNOSIS — Z3A13 13 weeks gestation of pregnancy: Secondary | ICD-10-CM

## 2021-11-16 DIAGNOSIS — O219 Vomiting of pregnancy, unspecified: Secondary | ICD-10-CM

## 2021-11-16 MED ORDER — CITALOPRAM HYDROBROMIDE 20 MG PO TABS: 40.0000 mg | ORAL_TABLET | Freq: Every day | ORAL | 12 refills | Status: DC

## 2021-11-16 MED ORDER — ONDANSETRON 4 MG PO TBDP: 4.0000 mg | ORAL_TABLET | Freq: Four times a day (QID) | ORAL | 0 refills | Status: DC | PRN

## 2021-11-16 NOTE — Progress Notes (Signed)
Obstetrics & Gynecology Office Visit   Chief Complaint:  Chief Complaint  Patient presents with   Initial Prenatal Visit    History of Present Illness: Patricia Mcbride was scheduled as a new patient today for a pregnancy confirmation. She presents with her husband.  She is new to the practice.this is an unplanned pregnancy for her, and she expresses feeling distant from the baby. During our interview she is at times angry in her tone, and then quite tearful.  Her medical history is also complicated by: High BMI, Advanced Maternal age, Type 2 diabetes ( and she is now on insulin after stopping  Ozempic) and MDD.   Review of Systems:  Review of Systems  Constitutional:  Positive for malaise/fatigue.  HENT: Negative.    Eyes: Negative.   Respiratory: Negative.    Cardiovascular: Negative.   Gastrointestinal:  Positive for constipation, nausea and vomiting.  Musculoskeletal: Negative.   Skin: Negative.   Neurological: Negative.   Endo/Heme/Allergies: Negative.   Psychiatric/Behavioral:  Positive for depression. The patient is nervous/anxious.        PHQ is 8 GAD is 7     Past Medical History:  Past Medical History:  Diagnosis Date   Depression    Diabetes ([place])    01/2021   GERD (gastroesophageal reflux disease)    Jaundice of newborn     Past Surgical History:  Past Surgical History:  Procedure Laterality Date   ESOPHAGOGASTRODUODENOSCOPY (EGD) WITH PROPOFOL N/A 01/19/2019   Procedure: ESOPHAGOGASTRODUODENOSCOPY (EGD) WITH PROPOFOL;  Surgeon: Lucilla Lame, MD;  Location: [facility] ENDOSCOPY;  Service: Endoscopy;  Laterality: N/A;   WISDOM TOOTH EXTRACTION     3 at diff times; early 20s    Gynecologic History: Patient's last menstrual period was 08/14/2021 (exact date).  Obstetric History: G3P0020  Family History:  Family History  Problem Relation Age of Onset   Depression Mother    Asthma Mother    Depression Father    Hypertension Father    Cancer Father 49        skin   Diabetes Father    Hyperlipidemia Father    Depression Sister    Hearing loss Sister    34 / Stillbirths Sister    Depression Sister    Hypertension Sister    Asthma Maternal Grandmother    Diabetes Maternal Grandmother    Hearing loss Maternal Grandmother    Heart disease Maternal Grandmother    Hyperlipidemia Maternal Grandmother    Hypertension Maternal Grandmother    Kidney disease Maternal Grandmother    Dementia Paternal Grandmother    Kidney disease Paternal Grandmother    Hypertension Paternal Grandmother    Hyperlipidemia Paternal Grandmother    Heart disease Paternal Grandmother    Alzheimer's disease Paternal Grandmother    Alzheimer's disease Paternal Grandfather    Heart disease Paternal Grandfather    Hyperlipidemia Paternal Grandfather    Hypertension Paternal Grandfather    Kidney disease Paternal Grandfather    Dementia Paternal Grandfather     Social History:  Social History   Socioeconomic History   Marital status: Married    Spouse name: Christy Sartorius   Number of children: 0   Years of education: 12   Highest education level: Not on file  Occupational History   Occupation: Nurse, 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 [employer]  Tobacco Use   Smoking status: Former   Smokeless tobacco: Never  Vaping Use   Vaping Use: Former   Quit date: 09/19/2021  Substance and Sexual Activity  Alcohol use: Not Currently    Comment: socially   Drug use: Not Currently   Sexual activity: Yes    Partners: Male    Birth control/protection: None  Other Topics Concern   Not on file  Social History Narrative   Not on file   Social Determinants of Health   Financial Resource Strain: Low Risk  (10/24/2021)   Overall Financial Resource Strain (CARDIA)    Difficulty of Paying Living Expenses: Not hard at all  Food Insecurity: No Food Insecurity (10/24/2021)   Hunger Vital Sign    Worried About Running Out of Food in the Last Year: Never true    Ran Out of Food  in the Last Year: Never true  Transportation Needs: No Transportation Needs (10/24/2021)   PRAPARE - Hydrologist (Medical): No    Lack of Transportation (Non-Medical): No  Physical Activity: Insufficiently Active (10/24/2021)   Exercise Vital Sign    Days of Exercise per Week: 2 days    Minutes of Exercise per Session: 40 min  Stress: No Stress Concern Present (10/24/2021)   [place]    Feeling of Stress : Not at all  Social Connections: Unknown (10/24/2021)   Social Connection and Isolation Panel [NHANES]    Frequency of Communication with Friends and Family: More than three times a week    Frequency of Social Gatherings with Friends and Family: Once a week    Attends Religious Services: Not on Diplomatic Services operational officer of Clubs or Organizations: No    Attends Archivist Meetings: Never    Marital Status: Married  Human resources officer Violence: Not At Risk (10/24/2021)   Humiliation, Afraid, Rape, and Kick questionnaire    Fear of Current or Ex-Partner: No    Emotionally Abused: No    Physically Abused: No    Sexually Abused: No    Allergies:  Allergies  Allergen Reactions   Morphine Nausea Only    Medications: Prior to Admission medications   Medication Sig Start Date End Date Taking? Authorizing Provider  citalopram (CELEXA) 20 MG tablet Take 2 tablets (40 mg total) by mouth daily. 11/16/21  Yes Imagene Riches, CNM  famotidine (PEPCID) 20 MG tablet Take 20 mg by mouth daily.   Yes [provider]  Glucagon (GVOKE HYPOPEN 1-PACK) 1 MG/0.2ML SOAJ Inject 1 mg into the skin as needed (for hypoglycemin). 10/03/21  Yes Bo Merino, FNP  insulin glargine, 1 Unit Dial, (TOUJEO) 300 UNIT/ML Solostar Pen Inject 10 Units into the skin daily. 10/03/21  Yes Bo Merino, FNP  Insulin Pen Needle (NOVOFINE PEN NEEDLE) 32G X 6 MM MISC 1 each by Does not apply route once a week.  06/29/21  Yes Bo Merino, FNP  metFORMIN (GLUCOPHAGE) 500 MG tablet Take 1 tablet (500 mg total) by mouth at bedtime. 05/28/21  Yes Bo Merino, FNP  Prenatal Vit-Fe Fumarate-FA (MULTIVITAMIN-PRENATAL) 27-0.8 MG TABS tablet Take 1 tablet by mouth daily at 12 noon.   Yes [provider]    Physical Exam Vitals:  Vitals:   11/16/21 1439  BP: 123/85  Pulse: (!) 109   Patient's last menstrual period was 08/14/2021 (exact date). Physical Exam Constitutional:      Appearance: She is obese.  HENT:     Head: Normocephalic and atraumatic.  Cardiovascular:     Rate and Rhythm: Normal rate and regular rhythm.  Pulses: Normal pulses.     Heart sounds: Normal heart sounds.  Pulmonary:     Effort: Pulmonary effort is normal.     Breath sounds: Normal breath sounds.  Abdominal:     Tenderness: There is abdominal tenderness.     Comments: Abdominal tenderness noted when trying to assess the FHTS. Unable to auscultate FHTs today- will need sono ASAP Hig BMI and body habitus.  Genitourinary:    General: Normal vulva.     Rectum: Normal.     Comments: Pt initially did not want a pelvic exam. Reported she has had a pap smear this year. Aptima swab retrieved. Normal ext genitalia, some vaginal discharge noted. Unable to get FHTs due to patient's discomfort with abdominal contact via doppler. Musculoskeletal:        General: Normal range of motion.     Cervical back: Normal range of motion and neck supple.  Skin:    General: Skin is warm and dry.  Neurological:     General: No focal deficit present.     Mental Status: She is oriented to person, place, and time.  Psychiatric:     Comments: Patient is at times tearful, then angry in expression. See PHQ and GAD scores        Assessment: 41 y.o. [id number] - new patient for first OB visit. Type 2 diabetes- now on insulin Morbid obesity MDD- was switched by her PCP off of Celexa and onto Wellbutrin- symptomatic Hx of LGSIL  pap   Plan: Problem List Items Addressed This Visit   None Visit Diagnoses     Supervision of high risk pregnancy, antepartum    -  Primary   Relevant Medications   citalopram (CELEXA) 20 MG tablet   Other Relevant Orders   [place] MFM OB COMP + 38 WK   MaterniT 21 plus Core, Blood   Cervicovaginal ancillary only   US OB Comp Less 14 Wks   [redacted] weeks gestation of pregnancy       Encounter for other screening for genetic and chromosomal anomalies       Relevant Orders   MaterniT 21 plus Core, Blood   Screen for STD (sexually transmitted disease)       Relevant Orders   Cervicovaginal ancillary only   HSV 1 and 2 Ab, IgG   Current mild episode of major depressive disorder, unspecified whether recurrent (HCC)       Relevant Medications   citalopram (CELEXA) 20 MG tablet     We drew a MaternT test today. I have ordered a dating scan to be done ASAP to check for FHTs We discussed to her comfort level, the risks in this pregnancy, including, AMA status, her BMI, the diabetes, and her mood issues. She is not happy taking the Wellbutrin. I have switched her back to the Celexa 40 mg po QHS, and she will stop the Bupropion. Referral to MFM made. Her anatomy scan is ordered to be done with MFM. I recommend she have her next few visits with an MD due to the aforementioned risks. I have also ordered her some limited Zofran ODT for her N/V.  RTC on 3 weeks for ROB. Imagene Riches, CNM  11/16/2021 5:28 PM

## 2021-11-17 LAB — HSV 1 AND 2 AB, IGG
HSV 1 Glycoprotein G Ab, IgG: 31.9 index — ABNORMAL HIGH (ref 0.00–0.90)
HSV 2 IgG, Type Spec: >23.6 index — ABNORMAL HIGH (ref 0.00–0.90)

## 2021-11-19 ENCOUNTER — Ambulatory Visit
Admission: RE | Admit: 2021-11-19 | Discharge: 2021-11-19 | Disposition: A | Discharge status: Home / Self Care | Payer: 59 | Source: Ambulatory Visit | Attending: Obstetrics | Admitting: Obstetrics

## 2021-11-19 DIAGNOSIS — O0990 Supervision of high risk pregnancy, unspecified, unspecified trimester: Secondary | ICD-10-CM | POA: Insufficient documentation

## 2021-11-20 ENCOUNTER — Encounter: Payer: Self-pay | Admitting: Obstetrics

## 2021-11-20 ENCOUNTER — Other Ambulatory Visit: Payer: Self-pay | Admitting: Obstetrics

## 2021-11-20 DIAGNOSIS — B3731 Acute candidiasis of vulva and vagina: Secondary | ICD-10-CM

## 2021-11-20 LAB — CERVICOVAGINAL ANCILLARY ONLY
Bacterial Vaginitis (gardnerella): NEGATIVE
Candida Glabrata: NEGATIVE
Candida Vaginitis: POSITIVE — AB
Chlamydia: NEGATIVE
Comment: NEGATIVE
Comment: NEGATIVE
Comment: NEGATIVE
Comment: NEGATIVE
Comment: NEGATIVE
Comment: NORMAL
Neisseria Gonorrhea: NEGATIVE
Trichomonas: NEGATIVE

## 2021-11-20 MED ORDER — TERCONAZOLE 0.4 % VA CREA: 1.0000 | TOPICAL_CREAM | Freq: Every day | VAGINAL | 0 refills | Status: DC

## 2021-11-24 LAB — MATERNIT 21 PLUS CORE, BLOOD: Fetal Fraction: <3

## 2021-11-29 ENCOUNTER — Other Ambulatory Visit (HOSPITAL_COMMUNITY)
Admission: RE | Admit: 2021-11-29 | Discharge: 2021-11-29 | Disposition: A | Discharge status: Home / Self Care | Payer: 59 | Source: Ambulatory Visit | Attending: Obstetrics and Gynecology | Admitting: Obstetrics and Gynecology

## 2021-11-29 ENCOUNTER — Other Ambulatory Visit: Payer: Self-pay

## 2021-11-29 DIAGNOSIS — O0992 Supervision of high risk pregnancy, unspecified, second trimester: Secondary | ICD-10-CM | POA: Insufficient documentation

## 2021-11-29 DIAGNOSIS — Z3A14 14 weeks gestation of pregnancy: Secondary | ICD-10-CM | POA: Diagnosis present

## 2021-11-29 DIAGNOSIS — Z113 Encounter for screening for infections with a predominantly sexual mode of transmission: Secondary | ICD-10-CM | POA: Diagnosis present

## 2021-11-30 ENCOUNTER — Other Ambulatory Visit: Payer: 59

## 2021-11-30 ENCOUNTER — Other Ambulatory Visit: Payer: Self-pay

## 2021-11-30 DIAGNOSIS — O0992 Supervision of high risk pregnancy, unspecified, second trimester: Secondary | ICD-10-CM

## 2021-11-30 DIAGNOSIS — Z3A14 14 weeks gestation of pregnancy: Secondary | ICD-10-CM

## 2021-11-30 DIAGNOSIS — Z113 Encounter for screening for infections with a predominantly sexual mode of transmission: Secondary | ICD-10-CM

## 2021-11-30 DIAGNOSIS — Z1379 Encounter for other screening for genetic and chromosomal anomalies: Secondary | ICD-10-CM

## 2021-12-01 LAB — URINALYSIS, ROUTINE W REFLEX MICROSCOPIC
Bilirubin, UA: NEGATIVE
Glucose, UA: NEGATIVE
Leukocytes,UA: NEGATIVE
Nitrite, UA: NEGATIVE
Protein,UA: NEGATIVE
RBC, UA: NEGATIVE
Specific Gravity, UA: 1.021 (ref 1.005–1.030)
Urobilinogen, Ur: 1 mg/dL (ref 0.2–1.0)
pH, UA: 6.5 (ref 5.0–7.5)

## 2021-12-02 ENCOUNTER — Encounter: Payer: Self-pay | Admitting: Obstetrics and Gynecology

## 2021-12-02 DIAGNOSIS — F419 Anxiety disorder, unspecified: Secondary | ICD-10-CM | POA: Insufficient documentation

## 2021-12-02 DIAGNOSIS — O24319 Unspecified pre-existing diabetes mellitus in pregnancy, unspecified trimester: Secondary | ICD-10-CM | POA: Insufficient documentation

## 2021-12-02 DIAGNOSIS — O99210 Obesity complicating pregnancy, unspecified trimester: Secondary | ICD-10-CM | POA: Insufficient documentation

## 2021-12-02 LAB — URINE CULTURE, OB REFLEX

## 2021-12-02 LAB — CULTURE, OB URINE

## 2021-12-03 LAB — PAIN MGT SCRN (14 DRUGS), UR
Amphetamine Scrn, Ur: NEGATIVE ng/mL
BARBITURATE SCREEN URINE: NEGATIVE ng/mL
BENZODIAZEPINE SCREEN, URINE: NEGATIVE ng/mL
Buprenorphine, Urine: NEGATIVE ng/mL
CANNABINOIDS UR QL SCN: NEGATIVE ng/mL
Cocaine (Metab) Scrn, Ur: NEGATIVE ng/mL
Creatinine(Crt), U: 161.2 mg/dL (ref 20.0–300.0)
Fentanyl, Urine: NEGATIVE pg/mL
Meperidine Screen, Urine: NEGATIVE ng/mL
Methadone Screen, Urine: NEGATIVE ng/mL
OXYCODONE+OXYMORPHONE UR QL SCN: NEGATIVE ng/mL
Opiate Scrn, Ur: NEGATIVE ng/mL
Ph of Urine: 6.2 (ref 4.5–8.9)
Phencyclidine Qn, Ur: NEGATIVE ng/mL
Propoxyphene Scrn, Ur: NEGATIVE ng/mL
Tramadol Screen, Urine: NEGATIVE ng/mL

## 2021-12-03 LAB — URINE CYTOLOGY ANCILLARY ONLY
Chlamydia: NEGATIVE
Comment: NEGATIVE
Comment: NORMAL
Neisseria Gonorrhea: NEGATIVE

## 2021-12-05 LAB — MATERNIT21  PLUS CORE+ESS+SCA, BLOOD
11q23 deletion (Jacobsen): NOT DETECTED
15q11 deletion (PW Angelman): NOT DETECTED
1p36 deletion syndrome: NOT DETECTED
22q11 deletion (DiGeorge): NOT DETECTED
4p16 deletion(Wolf-Hirschhorn): NOT DETECTED
5p15 deletion (Cri-du-chat): NOT DETECTED
8q24 deletion (Langer-Giedion): NOT DETECTED
Fetal Fraction: 3
Monosomy X (Turner Syndrome): NOT DETECTED
Result (T21): NEGATIVE
Trisomy 13 (Patau syndrome): NEGATIVE
Trisomy 16: NOT DETECTED
Trisomy 18 (Edwards syndrome): NEGATIVE
Trisomy 21 (Down syndrome): NEGATIVE
Trisomy 22: NOT DETECTED
XXX (Triple X Syndrome): NOT DETECTED
XXY (Klinefelter Syndrome): NOT DETECTED
XYY (Jacobs Syndrome): NOT DETECTED

## 2021-12-05 LAB — HIV ANTIBODY (ROUTINE TESTING W REFLEX): HIV Screen 4th Generation wRfx: NONREACTIVE

## 2021-12-06 ENCOUNTER — Telehealth: Payer: Self-pay | Admitting: Nurse Practitioner

## 2021-12-06 DIAGNOSIS — F339 Major depressive disorder, recurrent, unspecified: Secondary | ICD-10-CM

## 2021-12-06 NOTE — Telephone Encounter (Signed)
Called patient and left message that I do not see this on her medication list as active. Medication was D/C and another started. Did she want to switch back or did she ask for wrong medication

## 2021-12-06 NOTE — Telephone Encounter (Signed)
Requested by interface surescripts. Medication discontinued 11/16/21 change in therapy.  Requested Prescriptions  Refused Prescriptions Disp Refills   buPROPion (WELLBUTRIN XL) 150 MG 24 hr tablet [Pharmacy Med Name: BUPROPION XL 150MG  TABLETS (24 H)] 90 tablet 0    Sig: TAKE 1 TABLET(150 MG) BY MOUTH DAILY     Psychiatry: Antidepressants - bupropion Passed - 12/06/2021  8:10 AM      Passed - Cr in normal range and within 360 days    Creat  Date Value Ref Range Status  04/12/2021 0.64 0.50 - 0.97 mg/dL Final   Creatinine, Urine  Date Value Ref Range Status  08/10/2021 159 20 - 275 mg/dL Final         Passed - AST in normal range and within 360 days    AST  Date Value Ref Range Status  04/12/2021 12 10 - 30 U/L Final         Passed - ALT in normal range and within 360 days    ALT  Date Value Ref Range Status  04/12/2021 17 6 - 29 U/L Final         Passed - Completed PHQ-2 or PHQ-9 in the last 360 days      Passed - Last BP in normal range    BP Readings from Last 1 Encounters:  11/16/21 123/85         Passed - Valid encounter within last 6 months    Recent Outpatient Visits           2 months ago Positive pregnancy test   [facility] F, FNP   3 months ago Major depressive disorder, recurrent episode with anxious distress [facility])   [facility] F, FNP   5 months ago uncontrolled Type 2 diabetes mellitus with hyperglycemia, without long-term current use of insulin [facility])   [facility] F, FNP   6 months ago uncontrolled Type 2 diabetes mellitus with hyperglycemia, without long-term current use of insulin [facility])   [facility] F, FNP   7 months ago Type 2 diabetes mellitus with hyperglycemia, without long-term current use of insulin [facility])   [facility], Berniece Salines

## 2021-12-07 ENCOUNTER — Ambulatory Visit (INDEPENDENT_AMBULATORY_CARE_PROVIDER_SITE_OTHER): Payer: 59 | Admitting: Obstetrics and Gynecology

## 2021-12-07 ENCOUNTER — Encounter: Payer: Self-pay | Admitting: Obstetrics and Gynecology

## 2021-12-07 VITALS — BP 137/80 | HR 99 | Wt 267.2 lb

## 2021-12-07 DIAGNOSIS — O99282 Endocrine, nutritional and metabolic diseases complicating pregnancy, second trimester: Secondary | ICD-10-CM

## 2021-12-07 DIAGNOSIS — O24112 Pre-existing diabetes mellitus, type 2, in pregnancy, second trimester: Secondary | ICD-10-CM

## 2021-12-07 DIAGNOSIS — Z7984 Long term (current) use of oral hypoglycemic drugs: Secondary | ICD-10-CM

## 2021-12-07 DIAGNOSIS — F419 Anxiety disorder, unspecified: Secondary | ICD-10-CM

## 2021-12-07 DIAGNOSIS — E1165 Type 2 diabetes mellitus with hyperglycemia: Secondary | ICD-10-CM

## 2021-12-07 DIAGNOSIS — Z3A15 15 weeks gestation of pregnancy: Secondary | ICD-10-CM

## 2021-12-07 DIAGNOSIS — F32A Depression, unspecified: Secondary | ICD-10-CM

## 2021-12-07 DIAGNOSIS — O0992 Supervision of high risk pregnancy, unspecified, second trimester: Secondary | ICD-10-CM

## 2021-12-07 DIAGNOSIS — O219 Vomiting of pregnancy, unspecified: Secondary | ICD-10-CM

## 2021-12-07 DIAGNOSIS — F418 Other specified anxiety disorders: Secondary | ICD-10-CM

## 2021-12-07 DIAGNOSIS — O99212 Obesity complicating pregnancy, second trimester: Secondary | ICD-10-CM

## 2021-12-07 LAB — POCT URINALYSIS DIPSTICK OB
Bilirubin, UA: NEGATIVE
Blood, UA: NEGATIVE
Glucose, UA: NEGATIVE
Ketones, UA: NEGATIVE
Leukocytes, UA: NEGATIVE
Nitrite, UA: NEGATIVE
Spec Grav, UA: 1.02 (ref 1.010–1.025)
Urobilinogen, UA: 0.2 E.U./dL
pH, UA: 6 (ref 5.0–8.0)

## 2021-12-07 MED ORDER — ASPIRIN 81 MG PO TBEC: 81.0000 mg | DELAYED_RELEASE_TABLET | Freq: Every day | ORAL | 2 refills | Status: DC

## 2021-12-07 NOTE — Progress Notes (Signed)
ROB: Patient reports doing well on her Celexa. Notes vast improvement in her depression and anxiety symptoms. Reports blood sugars for the most part are normal. Most recent A1c is 6.9 (noting levels of 11 prior to pregnancy). Currently on insulin and Metformin. Discussed MFM referral for anatomy and fetal echo. To begin daily baby aspirin. Advised on need for antenatal testing and serial growth scans in 3rd trimester.  Patient with nausea and vomiting despite Zofran. Offered to transition to Reglan but patient would prefer to hold off on any medications as it is slowly starting to improve. Also discussed OTC remedies. Normal MaterniT21, for AFP next visit. For Anesthesia consult at 32 weeks. RTC in 4 weeks.

## 2021-12-07 NOTE — Progress Notes (Signed)
ROB 15.2: She is doing well, has some nausea and extreme vomiting. Zofran has not been working needs alternative. Otherwise, she is doing well.

## 2021-12-30 ENCOUNTER — Other Ambulatory Visit: Payer: Self-pay | Admitting: Licensed Practical Nurse

## 2021-12-30 DIAGNOSIS — O0990 Supervision of high risk pregnancy, unspecified, unspecified trimester: Secondary | ICD-10-CM

## 2021-12-30 NOTE — Progress Notes (Signed)
Pt with BMI 48, needs MFM consult/anatomy scan.  Order placed Carie Caddy, [place]   [facility]  12/30/21  9:41 PM

## 2022-01-02 NOTE — Telephone Encounter (Signed)
Error

## 2022-01-11 ENCOUNTER — Encounter: Payer: Self-pay | Admitting: Obstetrics and Gynecology

## 2022-01-11 ENCOUNTER — Ambulatory Visit (INDEPENDENT_AMBULATORY_CARE_PROVIDER_SITE_OTHER): Payer: 59 | Admitting: Obstetrics and Gynecology

## 2022-01-11 DIAGNOSIS — E1165 Type 2 diabetes mellitus with hyperglycemia: Secondary | ICD-10-CM

## 2022-01-11 DIAGNOSIS — O0992 Supervision of high risk pregnancy, unspecified, second trimester: Secondary | ICD-10-CM

## 2022-01-11 DIAGNOSIS — Z3A20 20 weeks gestation of pregnancy: Secondary | ICD-10-CM

## 2022-01-11 LAB — POCT URINALYSIS DIPSTICK
Bilirubin, UA: NEGATIVE
Blood, UA: NEGATIVE
Glucose, UA: NEGATIVE
Ketones, UA: NEGATIVE
Leukocytes, UA: NEGATIVE
Nitrite, UA: NEGATIVE
Protein, UA: NEGATIVE
Spec Grav, UA: 1.015 (ref 1.010–1.025)
Urobilinogen, UA: 1 E.U./dL
pH, UA: 6 (ref 5.0–8.0)

## 2022-01-11 NOTE — Addendum Note (Signed)
Addended by: Burtis Junes on: 01/11/2022 09:10 AM   Modules accepted: Orders

## 2022-01-11 NOTE — Progress Notes (Signed)
ROB: She states that her sugars are ranging between 135 and 210.  Obviously not well-controlled.  She reports that she is not doing fasting sugars.  She uses both metformin and insulin.  She has not yet felt the baby move.  Anatomy scan scheduled for January 4.  Needs fetal echo approximately 20 to 23 weeks patient instructed in sugar log and will bring it with her at her next appointment.

## 2022-01-24 ENCOUNTER — Other Ambulatory Visit: Payer: Self-pay

## 2022-01-24 ENCOUNTER — Ambulatory Visit: Payer: 59 | Attending: Obstetrics

## 2022-01-24 ENCOUNTER — Ambulatory Visit (HOSPITAL_BASED_OUTPATIENT_CLINIC_OR_DEPARTMENT_OTHER): Payer: 59 | Admitting: Obstetrics

## 2022-01-24 DIAGNOSIS — E119 Type 2 diabetes mellitus without complications: Secondary | ICD-10-CM | POA: Diagnosis not present

## 2022-01-24 DIAGNOSIS — E669 Obesity, unspecified: Secondary | ICD-10-CM

## 2022-01-24 DIAGNOSIS — O09522 Supervision of elderly multigravida, second trimester: Secondary | ICD-10-CM | POA: Diagnosis not present

## 2022-01-24 DIAGNOSIS — Z794 Long term (current) use of insulin: Secondary | ICD-10-CM

## 2022-01-24 DIAGNOSIS — O99212 Obesity complicating pregnancy, second trimester: Secondary | ICD-10-CM | POA: Diagnosis present

## 2022-01-24 DIAGNOSIS — O24112 Pre-existing diabetes mellitus, type 2, in pregnancy, second trimester: Secondary | ICD-10-CM | POA: Insufficient documentation

## 2022-01-24 DIAGNOSIS — O09512 Supervision of elderly primigravida, second trimester: Secondary | ICD-10-CM

## 2022-01-24 DIAGNOSIS — Z3A22 22 weeks gestation of pregnancy: Secondary | ICD-10-CM | POA: Diagnosis not present

## 2022-01-24 DIAGNOSIS — O0990 Supervision of high risk pregnancy, unspecified, unspecified trimester: Secondary | ICD-10-CM

## 2022-01-24 DIAGNOSIS — Z7984 Long term (current) use of oral hypoglycemic drugs: Secondary | ICD-10-CM

## 2022-01-24 NOTE — Progress Notes (Signed)
MFM Note  Patricia Mcbride was seen for a detailed fetal anatomy scan due to maternal obesity with a BMI of 64, advanced maternal age (42 years old), and pregestational diabetes.  The patient reports that she was diagnosed with type 2 diabetes about 1 year ago.  Her most recent hemoglobin A1c was 6.9%.  She is currently treated with once a day metformin and 10 units of insulin in the morning.  She had a cell free DNA test earlier in her pregnancy which indicated a low risk for trisomy 42, 94, and 13. A female fetus is predicted.   She was informed that the fetal growth and amniotic fluid level were appropriate for her gestational age.   The views of the fetal anatomy were limited today due to maternal body habitus and fetal position.  The patient was informed that anomalies may be missed due to technical limitations. If the fetus is in a suboptimal position or maternal habitus is increased, visualization of the fetus in the maternal uterus may be impaired.  The following were discussed during today's consultation:  Pregestational diabetes and pregnancy  The implications and management of diabetes in pregnancy was discussed in detail with the patient.    She was advised to continue to monitor her fingersticks 4 times daily (fasting and 2 hours after each meal).    She was advised that our goals for her fingerstick values are fasting values of 90-95 or less and two-hour postprandial values of 120 or less.    I reviewed her fingerstick values today and they all appear to be elevated with fasting levels in the 130s to 160s range and 2-hour postprandial values in the 160s to 190s range.  To help improve her glycemic control, she was advised to start taking metformin 500 mg twice a day (once in the morning and once in the evening).    Based on her weight, her insulin dose will probably have to be increased.  Consideration should be given to starting her on 20 units of long-acting NPH insulin  twice a day.    Adjustments should then be made to her insulin dose based on her fingerstick values once her metformin and insulin doses have been changed.  The patient was advised that getting her fingerstick values as close to the goals listed above would provide her with the most optimal obstetrical outcome.  Due to diabetes and obesity, we will continue to follow her with monthly growth ultrasounds.    She was given a referral for a fetal echocardiogram with [facility].  Weekly fetal testing should be started at around 32 weeks.  The increased risk of polyhydramnios, fetal macrosomia, and preeclampsia associated with diabetes was also discussed.    The patient was advised that delivery for well-controlled diabetes in pregnancy is usually recommended at around 39 weeks.    Delivery at 37 weeks may be considered should her glycemic control be poor.  Advanced maternal age and pregnancy  The increased risk of fetal aneuploidy due to advanced maternal age was discussed.   Due to advanced maternal age, the patient was offered and declined an amniocentesis today for definitive diagnosis of fetal aneuploidy.  She is comfortable with her negative cell free DNA test.  Obesity in pregnancy  The recommended total weight gain in pregnancy for obese women's between 10 to 20 pounds.  As maternal obesity may present challenges associated with the management of anesthesia, an anesthesia consult should be obtained when she is admitted in  labor.  The patient stated that all of her questions have been answered to her satisfaction.    A follow-up exam was scheduled in 5 weeks to assess the fetal growth and to complete the views of the fetal anatomy.  A total of 45 minutes was spent counseling and coordinating the care for this patient.  Greater than 50% of the time was spent in direct face-to-face contact.

## 2022-01-30 ENCOUNTER — Ambulatory Visit (INDEPENDENT_AMBULATORY_CARE_PROVIDER_SITE_OTHER): Payer: 59 | Admitting: Obstetrics and Gynecology

## 2022-01-30 ENCOUNTER — Encounter: Payer: Self-pay | Admitting: Obstetrics and Gynecology

## 2022-01-30 VITALS — BP 125/74 | HR 105 | Wt 265.9 lb

## 2022-01-30 DIAGNOSIS — Z794 Long term (current) use of insulin: Secondary | ICD-10-CM

## 2022-01-30 DIAGNOSIS — E119 Type 2 diabetes mellitus without complications: Secondary | ICD-10-CM

## 2022-01-30 DIAGNOSIS — O24312 Unspecified pre-existing diabetes mellitus in pregnancy, second trimester: Secondary | ICD-10-CM

## 2022-01-30 DIAGNOSIS — O0992 Supervision of high risk pregnancy, unspecified, second trimester: Secondary | ICD-10-CM

## 2022-01-30 DIAGNOSIS — O24112 Pre-existing diabetes mellitus, type 2, in pregnancy, second trimester: Secondary | ICD-10-CM

## 2022-01-30 DIAGNOSIS — O99210 Obesity complicating pregnancy, unspecified trimester: Secondary | ICD-10-CM

## 2022-01-30 DIAGNOSIS — Z3A22 22 weeks gestation of pregnancy: Secondary | ICD-10-CM

## 2022-01-30 DIAGNOSIS — O132 Gestational [pregnancy-induced] hypertension without significant proteinuria, second trimester: Secondary | ICD-10-CM | POA: Diagnosis present

## 2022-01-30 LAB — POCT URINALYSIS DIPSTICK OB
Bilirubin, UA: NEGATIVE
Glucose, UA: NEGATIVE
Ketones, UA: NEGATIVE
Leukocytes, UA: NEGATIVE
Nitrite, UA: NEGATIVE
Spec Grav, UA: 1.02 (ref 1.010–1.025)
Urobilinogen, UA: 0.2 E.U./dL
pH, UA: 6.5 (ref 5.0–8.0)

## 2022-01-30 NOTE — Progress Notes (Signed)
ROB [redacted]w[redacted]d: She is doing well. She has some concerns about her blood sugars that she would like to discuss today. Has some fetal movement.

## 2022-01-30 NOTE — Progress Notes (Signed)
ROB: Patient is an 42 y.o. G7P0020 female at [redacted]w[redacted]d who presents for routine prenatal care.  Patient's pregnancy is complicated by type 2 diabetes.  Currently still uncontrolled with fasting blood sugars patient notes that occasionally higher elevations above the 200s occurs if she eats something that she knows she is not supposed to but reports that this does not happen often.  Has increased her metformin to twice daily dosing however notes that she is beginning to note GI symptoms with this.  Would not increase this any further.  Discussed tighter control of diabetes, will increase insulin to twice daily dosing of 10 units.  Advised to increase by 2 units every 2 to 3 days based on blood sugars until goal blood sugars are reached (fasting less than 95 and postprandials between 120-130).  Patient also notes that she is eating more frequent snacks instead of larger meals as this is all that she can tolerate at this time.  I discussed that with his eating pattern it may make it a little harder to keep her blood sugars managed if every time she checks her blood sugars she will have recently eaten within an hour's time.  Appropriate snacks will not significantly increase glycemic index.  Patient has had her MFM consult and recent anatomy scan.  Is scheduled for fetal echo soon.  RTC in 1 week for further management of blood sugars.

## 2022-02-06 ENCOUNTER — Encounter: Payer: 59 | Admitting: Obstetrics and Gynecology

## 2022-02-08 ENCOUNTER — Encounter: Payer: Self-pay | Admitting: Obstetrics and Gynecology

## 2022-02-08 ENCOUNTER — Ambulatory Visit (INDEPENDENT_AMBULATORY_CARE_PROVIDER_SITE_OTHER): Payer: 59 | Admitting: Obstetrics and Gynecology

## 2022-02-08 VITALS — BP 134/92 | HR 102 | Wt 268.2 lb

## 2022-02-08 DIAGNOSIS — O0992 Supervision of high risk pregnancy, unspecified, second trimester: Secondary | ICD-10-CM

## 2022-02-08 DIAGNOSIS — Z3A24 24 weeks gestation of pregnancy: Secondary | ICD-10-CM

## 2022-02-08 LAB — POCT URINALYSIS DIPSTICK OB
Bilirubin, UA: NEGATIVE
Blood, UA: NEGATIVE
Glucose, UA: NEGATIVE
Leukocytes, UA: NEGATIVE
Nitrite, UA: NEGATIVE
POC,PROTEIN,UA: NEGATIVE
Spec Grav, UA: >=1.03 — AB (ref 1.010–1.025)
Urobilinogen, UA: 0.2 E.U./dL
pH, UA: 5 (ref 5.0–8.0)

## 2022-02-08 NOTE — Progress Notes (Signed)
ROB: Patient is a 42 y.o. G3P0020 at [redacted]w[redacted]d who presents for review of blood sugar log.  Insulin dose changed to 10 units BID dosing last week. Has slowly been titrating dose up, currently on 12 units BID. Notes her fastings are still elevated, but postprandials seem to be responding well to dosing.  Fastings ranging 116-132, postprandials 109-146, with one value of 256 but patient reports she ate ice cream with that meal).   Advised to increase AM dosing to 14 units and PM dosing to 16 units. Continue Metformin 500  mg BID. Has some decreased fetal movement. Did not feel baby move around much last night or this morning. FHT present today. Given reassurance.  RTC in 1 week for recheck of blood sugars. Also recheck BP at next visit, borderline today at 134/92.

## 2022-02-15 ENCOUNTER — Encounter: Payer: Self-pay | Admitting: Obstetrics and Gynecology

## 2022-02-15 ENCOUNTER — Ambulatory Visit (INDEPENDENT_AMBULATORY_CARE_PROVIDER_SITE_OTHER): Payer: 59 | Admitting: Obstetrics and Gynecology

## 2022-02-15 VITALS — BP 126/84 | HR 106 | Wt 268.6 lb

## 2022-02-15 DIAGNOSIS — O0992 Supervision of high risk pregnancy, unspecified, second trimester: Secondary | ICD-10-CM

## 2022-02-15 DIAGNOSIS — O24312 Unspecified pre-existing diabetes mellitus in pregnancy, second trimester: Secondary | ICD-10-CM

## 2022-02-15 DIAGNOSIS — O99210 Obesity complicating pregnancy, unspecified trimester: Secondary | ICD-10-CM

## 2022-02-15 DIAGNOSIS — E119 Type 2 diabetes mellitus without complications: Secondary | ICD-10-CM

## 2022-02-15 DIAGNOSIS — Z794 Long term (current) use of insulin: Secondary | ICD-10-CM

## 2022-02-15 DIAGNOSIS — O24112 Pre-existing diabetes mellitus, type 2, in pregnancy, second trimester: Secondary | ICD-10-CM

## 2022-02-15 DIAGNOSIS — Z3A25 25 weeks gestation of pregnancy: Secondary | ICD-10-CM

## 2022-02-15 LAB — POCT URINALYSIS DIPSTICK OB
Bilirubin, UA: NEGATIVE
Blood, UA: NEGATIVE
Glucose, UA: NEGATIVE
Ketones, UA: NEGATIVE
Leukocytes, UA: NEGATIVE
Nitrite, UA: NEGATIVE
Spec Grav, UA: 1.025 (ref 1.010–1.025)
Urobilinogen, UA: 0.2 E.U./dL
pH, UA: 6 (ref 5.0–8.0)

## 2022-02-15 NOTE — Progress Notes (Signed)
ROB: Patient with URI at this time.  Doubt COVID.  Discussed medication safe during pregnancy for symptomatic relief.  Sugars still not in control.  Have increased her insulin to 16 a.m. and 20 p.m.  Continues to take Glucophage twice daily.  Importance of dietary modification discussed.  Patient has completed her echo and only needs a follow-up ultrasound for aortic arch.  Anesthesia consult scheduled.  CBC next visit.

## 2022-02-15 NOTE — Progress Notes (Signed)
Patricia Mcbride. Patient states daily fetal movement along with round ligament pain. She states feeling under the weather today, gave safe medication list for colds. Patient brought sugar log today and is continuing to use her Insulin. OB Anaesthesilogy appointment on 03/01/22. Patient states no questions or concerns at this time.

## 2022-02-25 ENCOUNTER — Encounter: Payer: Self-pay | Admitting: Obstetrics and Gynecology

## 2022-02-25 DIAGNOSIS — E1165 Type 2 diabetes mellitus with hyperglycemia: Secondary | ICD-10-CM

## 2022-02-25 MED ORDER — INSULIN GLARGINE (1 UNIT DIAL) 300 UNIT/ML ~~LOC~~ SOPN: PEN_INJECTOR | SUBCUTANEOUS | 6 refills | Status: DC

## 2022-02-26 ENCOUNTER — Other Ambulatory Visit: Payer: Self-pay

## 2022-02-26 DIAGNOSIS — O24113 Pre-existing diabetes mellitus, type 2, in pregnancy, third trimester: Secondary | ICD-10-CM

## 2022-02-26 DIAGNOSIS — O99213 Obesity complicating pregnancy, third trimester: Secondary | ICD-10-CM

## 2022-02-26 DIAGNOSIS — O09523 Supervision of elderly multigravida, third trimester: Secondary | ICD-10-CM

## 2022-02-28 ENCOUNTER — Ambulatory Visit (HOSPITAL_BASED_OUTPATIENT_CLINIC_OR_DEPARTMENT_OTHER): Payer: Managed Care, Other (non HMO)

## 2022-02-28 ENCOUNTER — Other Ambulatory Visit: Payer: Self-pay

## 2022-02-28 ENCOUNTER — Encounter: Payer: Self-pay | Admitting: Family

## 2022-02-28 ENCOUNTER — Observation Stay
Admission: AD | Admit: 2022-02-28 | Discharge: 2022-02-28 | Disposition: A | Discharge status: Home / Self Care | Payer: Managed Care, Other (non HMO) | Attending: Obstetrics and Gynecology | Admitting: Obstetrics and Gynecology

## 2022-02-28 DIAGNOSIS — Z794 Long term (current) use of insulin: Secondary | ICD-10-CM | POA: Diagnosis not present

## 2022-02-28 DIAGNOSIS — Z7982 Long term (current) use of aspirin: Secondary | ICD-10-CM | POA: Diagnosis not present

## 2022-02-28 DIAGNOSIS — O169 Unspecified maternal hypertension, unspecified trimester: Secondary | ICD-10-CM | POA: Diagnosis present

## 2022-02-28 DIAGNOSIS — R030 Elevated blood-pressure reading, without diagnosis of hypertension: Secondary | ICD-10-CM | POA: Insufficient documentation

## 2022-02-28 DIAGNOSIS — O24112 Pre-existing diabetes mellitus, type 2, in pregnancy, second trimester: Secondary | ICD-10-CM | POA: Diagnosis not present

## 2022-02-28 DIAGNOSIS — O24113 Pre-existing diabetes mellitus, type 2, in pregnancy, third trimester: Secondary | ICD-10-CM

## 2022-02-28 DIAGNOSIS — O99212 Obesity complicating pregnancy, second trimester: Secondary | ICD-10-CM | POA: Diagnosis not present

## 2022-02-28 DIAGNOSIS — Z87891 Personal history of nicotine dependence: Secondary | ICD-10-CM | POA: Diagnosis not present

## 2022-02-28 DIAGNOSIS — E119 Type 2 diabetes mellitus without complications: Secondary | ICD-10-CM

## 2022-02-28 DIAGNOSIS — O09522 Supervision of elderly multigravida, second trimester: Secondary | ICD-10-CM | POA: Insufficient documentation

## 2022-02-28 DIAGNOSIS — O09523 Supervision of elderly multigravida, third trimester: Secondary | ICD-10-CM

## 2022-02-28 DIAGNOSIS — O162 Unspecified maternal hypertension, second trimester: Secondary | ICD-10-CM | POA: Diagnosis present

## 2022-02-28 DIAGNOSIS — O26892 Other specified pregnancy related conditions, second trimester: Secondary | ICD-10-CM | POA: Insufficient documentation

## 2022-02-28 DIAGNOSIS — Z3A27 27 weeks gestation of pregnancy: Secondary | ICD-10-CM | POA: Insufficient documentation

## 2022-02-28 DIAGNOSIS — Z79899 Other long term (current) drug therapy: Secondary | ICD-10-CM | POA: Diagnosis not present

## 2022-02-28 DIAGNOSIS — O99213 Obesity complicating pregnancy, third trimester: Secondary | ICD-10-CM

## 2022-02-28 DIAGNOSIS — Z7984 Long term (current) use of oral hypoglycemic drugs: Secondary | ICD-10-CM | POA: Diagnosis not present

## 2022-02-28 DIAGNOSIS — E669 Obesity, unspecified: Secondary | ICD-10-CM

## 2022-02-28 LAB — LACTATE DEHYDROGENASE: LDH: 95 U/L — ABNORMAL LOW (ref 98–192)

## 2022-02-28 LAB — COMPREHENSIVE METABOLIC PANEL
ALT: 11 U/L (ref 0–44)
AST: 14 U/L — ABNORMAL LOW (ref 15–41)
Albumin: 2.8 g/dL — ABNORMAL LOW (ref 3.5–5.0)
Alkaline Phosphatase: 80 U/L (ref 38–126)
Anion gap: 7 (ref 5–15)
BUN: 7 mg/dL (ref 6–20)
CO2: 24 mmol/L (ref 22–32)
Calcium: 8.7 mg/dL — ABNORMAL LOW (ref 8.9–10.3)
Chloride: 103 mmol/L (ref 98–111)
Creatinine, Ser: 0.58 mg/dL (ref 0.44–1.00)
GFR, Estimated: >60 mL/min (ref >60)
Glucose, Bld: 170 mg/dL — ABNORMAL HIGH (ref 70–99)
Potassium: 4.3 mmol/L (ref 3.5–5.1)
Sodium: 134 mmol/L — ABNORMAL LOW (ref 135–145)
Total Bilirubin: 0.4 mg/dL (ref 0.3–1.2)
Total Protein: 6.3 g/dL — ABNORMAL LOW (ref 6.5–8.1)

## 2022-02-28 LAB — CBC
HCT: 33.2 % — ABNORMAL LOW (ref 36.0–46.0)
Hemoglobin: 11 g/dL — ABNORMAL LOW (ref 12.0–15.0)
MCH: 28.5 pg (ref 26.0–34.0)
MCHC: 33.1 g/dL (ref 30.0–36.0)
MCV: 86 fL (ref 80.0–100.0)
Platelets: 336 10*3/uL (ref 150–400)
RBC: 3.86 MIL/uL — ABNORMAL LOW (ref 3.87–5.11)
RDW: 13.2 % (ref 11.5–15.5)
WBC: 13.9 10*3/uL — ABNORMAL HIGH (ref 4.0–10.5)
nRBC: 0 % (ref 0.0–0.2)

## 2022-02-28 LAB — PROTEIN / CREATININE RATIO, URINE
Creatinine, Urine: 100 mg/dL
Protein Creatinine Ratio: 0.09 mg/mg{Cre} (ref 0.00–0.15)
Total Protein, Urine: 9 mg/dL

## 2022-02-28 NOTE — OB Triage Note (Signed)
Patient sent over from maternal fetal medicine clinic due to elevated blood pressures during visit.  Patient denies blurry vision now, states she has some at work when she is working on the computer. Denies upper gastric pain, denies any headaches, denies decreased fetal movements, vaginal bleeding or leaking of fluid.

## 2022-02-28 NOTE — Final Progress Note (Addendum)
OB/Triage Note  Patient ID: Patricia Mcbride MRN: [id number] DOB/AGE: 1980-07-10 42 y.o.  Subjective  History of Present Illness: The patient is a 42 y.o. female G3P0020 at [redacted]w[redacted]d who presents from MFM with concerns about PIH. Had several mild range blood pressures while at MFM visit (139/90, 144/83, 152/84). Currently denies visual changes, HA, RUQ pain, contractions, LOF, VB.   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   Past Medical History:  Diagnosis Date   Depression    Diabetes (HCC)    01/2021   GERD (gastroesophageal reflux disease)    Jaundice of newborn     Past Surgical History:  Procedure Laterality Date   ESOPHAGOGASTRODUODENOSCOPY (EGD) WITH PROPOFOL N/A 01/19/2019   Procedure: ESOPHAGOGASTRODUODENOSCOPY (EGD) WITH PROPOFOL;  Surgeon: Midge Minium, MD;  Location: [facility] ENDOSCOPY;  Service: Endoscopy;  Laterality: N/A;   WISDOM TOOTH EXTRACTION     3 at diff times; early 20s    No current facility-administered medications on file prior to encounter.   Current Outpatient Medications on File Prior to Encounter  Medication Sig Dispense Refill   aspirin EC 81 MG tablet Take 1 tablet (81 mg total) by mouth daily. Take after 12 weeks for prevention of preeclampssia later in pregnancy 300 tablet 2   citalopram (CELEXA) 20 MG tablet Take 2 tablets (40 mg total) by mouth daily. 30 tablet 12   famotidine (PEPCID) 20 MG tablet Take 20 mg by mouth daily.     Glucagon (GVOKE HYPOPEN 1-PACK) 1 MG/0.2ML SOAJ Inject 1 mg into the skin as needed (for hypoglycemin). 0.2 mL 3   insulin glargine, 1 Unit Dial, (TOUJEO) 300 UNIT/ML Solostar Pen Inject 18 units in AM with breakfast, and 24 units in PM with dinner (Patient taking differently: 46 Units daily. Inject 22 units in AM with breakfast, and 24 units in PM with dinner) 1.5 mL 6   Insulin Pen Needle (NOVOFINE PEN NEEDLE) 32G X 6 MM MISC  1 each by Does not apply route once a week. 30 each 0   metFORMIN (GLUCOPHAGE) 500 MG tablet Take 1 tablet (500 mg total) by mouth at bedtime. 90 tablet 3   Prenatal Vit-Fe Fumarate-FA (MULTIVITAMIN-PRENATAL) 27-0.8 MG TABS tablet Take 1 tablet by mouth daily at 12 noon.      Allergies  Allergen Reactions   Morphine Nausea Only    Social History   Socioeconomic History   Marital status: Married    Spouse name: Alecia Lemming   Number of children: 0   Years of education: 12   Highest education level: Not on file  Occupational History   Occupation: Printmaker for [employer]  Tobacco Use   Smoking status: Former   Smokeless tobacco: Never  Vaping Use   Vaping Use: Former   Quit date: 09/19/2021  Substance and Sexual Activity   Alcohol use: Not Currently    Comment: socially   Drug use: Not Currently   Sexual activity: Yes    Partners: Male    Birth control/protection: None  Other Topics Concern   Not on file  Social History Narrative   Not on file   Social Determinants of Health   Financial Resource Strain: Low Risk  (10/24/2021)   Overall Financial Resource Strain (CARDIA)    Difficulty of Paying Living Expenses: Not hard at all  Food Insecurity: No Food Insecurity (10/24/2021)   Hunger Vital Sign    Worried About  Running Out of Food in the Last Year: Never true    Ran Out of Food in the Last Year: Never true  Transportation Needs: No Transportation Needs (10/24/2021)   PRAPARE - Administrator, Civil Service (Medical): No    Lack of Transportation (Non-Medical): No  Physical Activity: Insufficiently Active (10/24/2021)   Exercise Vital Sign    Days of Exercise per Week: 2 days    Minutes of Exercise per Session: 40 min  Stress: No Stress Concern Present (10/24/2021)   [employer] of Occupational Health - Occupational Stress Questionnaire    Feeling of Stress : Not at all  Social Connections: Unknown (10/24/2021)   Social Connection and  Isolation Panel [NHANES]    Frequency of Communication with Friends and Family: More than three times a week    Frequency of Social Gatherings with Friends and Family: Once a week    Attends Religious Services: Not on Insurance claims handler of Clubs or Organizations: No    Attends Banker Meetings: Never    Marital Status: Married  Catering manager Violence: Not At Risk (10/24/2021)   Humiliation, Afraid, Rape, and Kick questionnaire    Fear of Current or Ex-Partner: No    Emotionally Abused: No    Physically Abused: No    Sexually Abused: No    Family History  Problem Relation Age of Onset   Depression Mother    Asthma Mother    Depression Father    Hypertension Father    Cancer Father 74       skin   Diabetes Father    Hyperlipidemia Father    Depression Sister    Hearing loss Sister    Miscarriages / Stillbirths Sister    Depression Sister    Hypertension Sister    Asthma Maternal Grandmother    Diabetes Maternal Grandmother    Hearing loss Maternal Grandmother    Heart disease Maternal Grandmother    Hyperlipidemia Maternal Grandmother    Hypertension Maternal Grandmother    Kidney disease Maternal Grandmother    Dementia Paternal Grandmother    Kidney disease Paternal Grandmother    Hypertension Paternal Grandmother    Hyperlipidemia Paternal Grandmother    Heart disease Paternal Grandmother    Alzheimer's disease Paternal Grandmother    Alzheimer's disease Paternal Grandfather    Heart disease Paternal Grandfather    Hyperlipidemia Paternal Grandfather    Hypertension Paternal Grandfather    Kidney disease Paternal Grandfather    Dementia Paternal Grandfather      ROS    Objective  Physical Exam: BP (!) 154/72   Pulse 91   Temp 98.1 F (36.7 C) (Oral)   Resp 19   LMP 08/14/2021 (Exact Date)   OBGyn Exam  FHT 150, mod variability, pos accels, no decels Toco no contractions  Significant Findings/ Diagnostic Studies: PIH labs  pending   Hospital Course: The patient was admitted to [facility] Triage for observation. PIH labs were drawn. Patient had mild range pressures while in OB/triage, was anxious about missing fetal echo appt in [place] at 1pm. Consulted with Dr. Valentino Saxon who was comfortable with patient leaving since labs had been drawn so that she can get to her appointment. Will Hodgkin patient with results once available. She has an appointment next week with Dr. Valentino Saxon.   Assessment: 42 y.o. female G3P0020 at [redacted]w[redacted]d   Plan: Discharge home Follow up next week with Dr. Valentino Saxon Will Sytsma with lab results Counseled on  preeclampsia warning signs  Discharge Instructions     Discharge activity:  No Restrictions   Complete by: As directed    Discharge instructions   Complete by: As directed    Will Martell with lab results      Allergies as of 02/28/2022       Reactions   Morphine Nausea Only        Medication List     TAKE these medications    aspirin EC 81 MG tablet Take 1 tablet (81 mg total) by mouth daily. Take after 12 weeks for prevention of preeclampssia later in pregnancy   citalopram 20 MG tablet Commonly known as: CeleXA Take 2 tablets (40 mg total) by mouth daily.   famotidine 20 MG tablet Commonly known as: PEPCID Take 20 mg by mouth daily.   Gvoke HypoPen 1-Pack 1 MG/0.2ML Soaj Generic drug: Glucagon Inject 1 mg into the skin as needed (for hypoglycemin).   insulin glargine (1 Unit Dial) 300 UNIT/ML Solostar Pen Commonly known as: TOUJEO Inject 18 units in AM with breakfast, and 24 units in PM with dinner What changed:  how much to take when to take this additional instructions   metFORMIN 500 MG tablet Commonly known as: GLUCOPHAGE Take 1 tablet (500 mg total) by mouth at bedtime.   multivitamin-prenatal 27-0.8 MG Tabs tablet Take 1 tablet by mouth daily at 12 noon.   Novofine Pen Needle 32G X 6 MM Misc Generic drug: Insulin Pen Needle 1 each by Does not apply route once a  week.         Total time spent taking care of this patient: 45 minutes  Signed: Raeford Razor CNM, FNP 02/28/2022, 12:25 PM    Spoke with Victorino Dike on the phone. She is not experiencing any HA, visual changes or RUQ pain. Reviewed labs which are WNL, she does not have preeclampsia. Has next ROB on 2/15 with Dr. Valentino Saxon. Reviewed preeclampsia s/sx and when to return. All questions answered.  Raeford Razor 3:43 PM 02/28/22

## 2022-02-28 NOTE — Progress Notes (Signed)
Patient discharged to go to previously scheduled appointment in [place].  Dr.Cherry will [place] patient to return if tests are elevated.  Patient denies any questions at this time.

## 2022-02-28 NOTE — Progress Notes (Signed)
Pt with elevated BP's at MFM appt today.  Ultrasound completed.  MFM wanted pt to get [facility] labs done following ultrasound.  Pt transported to [facility] via wheelchair.  Report given to Diona Fanti, RN.

## 2022-03-01 ENCOUNTER — Inpatient Hospital Stay: Admission: RE | Admit: 2022-03-01 | Payer: 59 | Source: Ambulatory Visit

## 2022-03-07 ENCOUNTER — Ambulatory Visit (INDEPENDENT_AMBULATORY_CARE_PROVIDER_SITE_OTHER): Payer: 59 | Admitting: Obstetrics and Gynecology

## 2022-03-07 ENCOUNTER — Encounter
Admission: RE | Admit: 2022-03-07 | Discharge: 2022-03-07 | Disposition: A | Discharge status: Home / Self Care | Payer: Managed Care, Other (non HMO) | Source: Ambulatory Visit | Attending: *Deleted | Admitting: *Deleted

## 2022-03-07 ENCOUNTER — Encounter: Payer: Self-pay | Admitting: Obstetrics and Gynecology

## 2022-03-07 VITALS — BP 136/79 | HR 84 | Wt 276.2 lb

## 2022-03-07 DIAGNOSIS — Z23 Encounter for immunization: Secondary | ICD-10-CM

## 2022-03-07 DIAGNOSIS — Z3483 Encounter for supervision of other normal pregnancy, third trimester: Secondary | ICD-10-CM

## 2022-03-07 DIAGNOSIS — Z3A28 28 weeks gestation of pregnancy: Secondary | ICD-10-CM

## 2022-03-07 DIAGNOSIS — E119 Type 2 diabetes mellitus without complications: Secondary | ICD-10-CM

## 2022-03-07 DIAGNOSIS — O99210 Obesity complicating pregnancy, unspecified trimester: Secondary | ICD-10-CM

## 2022-03-07 DIAGNOSIS — O24313 Unspecified pre-existing diabetes mellitus in pregnancy, third trimester: Secondary | ICD-10-CM

## 2022-03-07 DIAGNOSIS — Z794 Long term (current) use of insulin: Secondary | ICD-10-CM

## 2022-03-07 DIAGNOSIS — O09523 Supervision of elderly multigravida, third trimester: Secondary | ICD-10-CM

## 2022-03-07 DIAGNOSIS — O24113 Pre-existing diabetes mellitus, type 2, in pregnancy, third trimester: Secondary | ICD-10-CM

## 2022-03-07 DIAGNOSIS — O133 Gestational [pregnancy-induced] hypertension without significant proteinuria, third trimester: Secondary | ICD-10-CM

## 2022-03-07 LAB — POCT URINALYSIS DIPSTICK OB
Bilirubin, UA: NEGATIVE
Blood, UA: NEGATIVE
Glucose, UA: NEGATIVE
Ketones, UA: NEGATIVE
Leukocytes, UA: NEGATIVE
Nitrite, UA: NEGATIVE
Spec Grav, UA: 1.025 (ref 1.010–1.025)
Urobilinogen, UA: 0.2 E.U./dL
pH, UA: 6 (ref 5.0–8.0)

## 2022-03-07 NOTE — Progress Notes (Signed)
ROB [redacted]w[redacted]d She is doing well today. She reports good fetal movement, she has had some mild pelvic pain that comes and goes when she moves a certain way. TDAP/BTC form signed today.
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
## 2022-03-07 NOTE — Patient Instructions (Signed)
Tdap (Tetanus, Diphtheria, Pertussis) Vaccine: What You Need to Know 1. Why get vaccinated? Tdap vaccine can prevent tetanus, diphtheria, and pertussis. Diphtheria and pertussis spread from person to person. Tetanus enters the body through cuts or wounds. TETANUS (T) causes painful stiffening of the muscles. Tetanus can lead to serious health problems, including being unable to open the mouth, having trouble swallowing and breathing, or death. DIPHTHERIA (D) can lead to difficulty breathing, heart failure, paralysis, or death. PERTUSSIS (aP), also known as "whooping cough," can cause uncontrollable, violent coughing that makes it hard to breathe, eat, or drink. Pertussis can be extremely serious especially in babies and young children, causing pneumonia, convulsions, brain damage, or death. In teens and adults, it can cause weight loss, loss of bladder control, passing out, and rib fractures from severe coughing. 2. Tdap vaccine Tdap is only for children 7 years and older, adolescents, and adults.  Adolescents should receive a single dose of Tdap, preferably at age 10 or 71 years. Pregnant people should get a dose of Tdap during every pregnancy, preferably during the early part of the third trimester, to help protect the newborn from pertussis. Infants are most at risk for severe, life-threatening complications from pertussis. Adults who have never received Tdap should get a dose of Tdap. Also, adults should receive a booster dose of either Tdap or Td (a different vaccine that protects against tetanus and diphtheria but not pertussis) every 10 years, or after 5 years in the case of a severe or dirty wound or burn. Tdap may be given at the same time as other vaccines. 3. Talk with your health care provider Tell your vaccine provider if the person getting the vaccine: Has had an allergic reaction after a previous dose of any vaccine that protects against tetanus, diphtheria, or pertussis, or has any  severe, life-threatening allergies Has had a coma, decreased level of consciousness, or prolonged seizures within 7 days after a previous dose of any pertussis vaccine (DTP, DTaP, or Tdap) Has seizures or another nervous system problem Has ever had Guillain-Barr Syndrome (also called "GBS") Has had severe pain or swelling after a previous dose of any vaccine that protects against tetanus or diphtheria In some cases, your health care provider may decide to postpone Tdap vaccination until a future visit. People with minor illnesses, such as a cold, may be vaccinated. People who are moderately or severely ill should usually wait until they recover before getting Tdap vaccine.  Your health care provider can give you more information. 4. Risks of a vaccine reaction Pain, redness, or swelling where the shot was given, mild fever, headache, feeling tired, and nausea, vomiting, diarrhea, or stomachache sometimes happen after Tdap vaccination. People sometimes faint after medical procedures, including vaccination. Tell your provider if you feel dizzy or have vision changes or ringing in the ears.  As with any medicine, there is a very remote chance of a vaccine causing a severe allergic reaction, other serious injury, or death. 5. What if there is a serious problem? An allergic reaction could occur after the vaccinated person leaves the clinic. If you see signs of a severe allergic reaction (hives, swelling of the face and throat, difficulty breathing, a fast heartbeat, dizziness, or weakness), [place] 9-1-1 and get the person to the nearest hospital. For other signs that concern you, [place] your health care provider.  Adverse reactions should be reported to the Vaccine Adverse Event Reporting System (VAERS). Your health care provider will usually file this report, or you  can do it yourself. Visit the VAERS website at www.vaers.hhs.gov or [place] 1-800-822-7967. VAERS is only for reporting reactions, and VAERS staff  members do not give medical advice. 6. The National Vaccine Injury Compensation Program The National Vaccine Injury Compensation Program (VICP) is a federal program that was created to compensate people who may have been injured by certain vaccines. Claims regarding alleged injury or death due to vaccination have a time limit for filing, which may be as short as two years. Visit the VICP website at www.hrsa.gov/vaccinecompensation or [place] 1-800-338-2382 to learn about the program and about filing a claim. 7. How can I learn more? Ask your health care provider. [place] your local or state health department. Visit the website of the Food and Drug Administration (FDA) for vaccine package inserts and additional information at www.fda.gov/vaccines-blood-biologics/vaccines. Contact the Centers for Disease Control and Prevention (CDC): [place] 1-800-232-4636 (1-800-CDC-INFO) or Visit CDC's website at www.cdc.gov/vaccines. Source: CDC Vaccine Information Statement Tdap (Tetanus, Diphtheria, Pertussis) Vaccine (08/27/2019) This same material is available at www.cdc.gov for no charge. This information is not intended to replace advice given to you by your health care provider. Make sure you discuss any questions you have with your health care provider. Document Revised: 12/05/2020 Document Reviewed: 10/09/2020 Elsevier Patient Education  2023 Elsevier Inc.  

## 2022-03-08 ENCOUNTER — Encounter: Payer: Self-pay | Admitting: Obstetrics and Gynecology

## 2022-03-08 LAB — RPR: RPR Ser Ql: NONREACTIVE

## 2022-03-21 ENCOUNTER — Encounter: Payer: Self-pay | Admitting: Obstetrics and Gynecology

## 2022-03-21 ENCOUNTER — Ambulatory Visit (INDEPENDENT_AMBULATORY_CARE_PROVIDER_SITE_OTHER): Payer: Managed Care, Other (non HMO) | Admitting: Obstetrics and Gynecology

## 2022-03-21 VITALS — BP 151/88 | HR 90 | Wt 290.3 lb

## 2022-03-21 DIAGNOSIS — R82998 Other abnormal findings in urine: Secondary | ICD-10-CM

## 2022-03-21 DIAGNOSIS — O133 Gestational [pregnancy-induced] hypertension without significant proteinuria, third trimester: Secondary | ICD-10-CM

## 2022-03-21 DIAGNOSIS — E119 Type 2 diabetes mellitus without complications: Secondary | ICD-10-CM

## 2022-03-21 DIAGNOSIS — Z794 Long term (current) use of insulin: Secondary | ICD-10-CM

## 2022-03-21 DIAGNOSIS — O24313 Unspecified pre-existing diabetes mellitus in pregnancy, third trimester: Secondary | ICD-10-CM

## 2022-03-21 DIAGNOSIS — Z3A30 30 weeks gestation of pregnancy: Secondary | ICD-10-CM

## 2022-03-21 DIAGNOSIS — O09523 Supervision of elderly multigravida, third trimester: Secondary | ICD-10-CM

## 2022-03-21 LAB — POCT URINALYSIS DIPSTICK OB
Bilirubin, UA: NEGATIVE
Glucose, UA: NEGATIVE
Ketones, UA: NEGATIVE
Nitrite, UA: NEGATIVE
Spec Grav, UA: 1.02 (ref 1.010–1.025)
Urobilinogen, UA: 0.2 E.U./dL
pH, UA: 5 (ref 5.0–8.0)

## 2022-03-21 NOTE — Progress Notes (Signed)
ROB. Patient states daily fetal movement with pelvic pressure. She states bilateral leg and ankle swelling starting this past week, denies headaches or blurry vision. She brought her sugar log today. She states no questions or concerns at this time.

## 2022-03-21 NOTE — Progress Notes (Signed)
ROB: Patient states she has increased swelling in her lower extremities.  She is currently on 28 and 32 of insulin as well as metformin.  Sugar log reveals elevated fastings.  Will increase evening to 34. (28/34) Patient has elevated blood pressure today.  She had this before and it normalized.  Recheck next week.  If still elevated consider labetalol start.  NSTs to begin in 2 weeks.  Fetal growth at approximately 34 weeks.  Begin HSV prophylaxis at 36 weeks.  Re-do anesthesia consult at 35 to 36 weeks.  I have discussed the possibility of early induction based on gestational hypertension and diabetes.  Urine today not diagnostic-sent for culture and sensitivity.

## 2022-03-23 LAB — URINE CULTURE

## 2022-03-28 ENCOUNTER — Ambulatory Visit: Payer: Managed Care, Other (non HMO) | Admitting: Obstetrics & Gynecology

## 2022-03-28 ENCOUNTER — Encounter: Payer: Self-pay | Admitting: Obstetrics & Gynecology

## 2022-03-28 ENCOUNTER — Inpatient Hospital Stay
Admission: EM | Admit: 2022-03-28 | Discharge: 2022-04-06 | DRG: 787 | Disposition: A | Discharge status: Home / Self Care | Payer: Managed Care, Other (non HMO) | Attending: Certified Nurse Midwife | Admitting: Certified Nurse Midwife

## 2022-03-28 ENCOUNTER — Other Ambulatory Visit: Payer: Self-pay

## 2022-03-28 ENCOUNTER — Encounter: Payer: Self-pay | Admitting: Obstetrics

## 2022-03-28 VITALS — BP 175/90 | HR 96 | Wt 297.9 lb

## 2022-03-28 DIAGNOSIS — O09523 Supervision of elderly multigravida, third trimester: Secondary | ICD-10-CM | POA: Insufficient documentation

## 2022-03-28 DIAGNOSIS — B009 Herpesviral infection, unspecified: Secondary | ICD-10-CM

## 2022-03-28 DIAGNOSIS — D509 Iron deficiency anemia, unspecified: Secondary | ICD-10-CM | POA: Diagnosis present

## 2022-03-28 DIAGNOSIS — O132 Gestational [pregnancy-induced] hypertension without significant proteinuria, second trimester: Secondary | ICD-10-CM | POA: Diagnosis present

## 2022-03-28 DIAGNOSIS — Z818 Family history of other mental and behavioral disorders: Secondary | ICD-10-CM

## 2022-03-28 DIAGNOSIS — Z7982 Long term (current) use of aspirin: Secondary | ICD-10-CM

## 2022-03-28 DIAGNOSIS — F339 Major depressive disorder, recurrent, unspecified: Secondary | ICD-10-CM | POA: Diagnosis present

## 2022-03-28 DIAGNOSIS — O163 Unspecified maternal hypertension, third trimester: Secondary | ICD-10-CM

## 2022-03-28 DIAGNOSIS — E6609 Other obesity due to excess calories: Secondary | ICD-10-CM

## 2022-03-28 DIAGNOSIS — O99214 Obesity complicating childbirth: Secondary | ICD-10-CM | POA: Diagnosis present

## 2022-03-28 DIAGNOSIS — Z7984 Long term (current) use of oral hypoglycemic drugs: Secondary | ICD-10-CM

## 2022-03-28 DIAGNOSIS — O43123 Velamentous insertion of umbilical cord, third trimester: Secondary | ICD-10-CM | POA: Diagnosis present

## 2022-03-28 DIAGNOSIS — F419 Anxiety disorder, unspecified: Secondary | ICD-10-CM | POA: Diagnosis present

## 2022-03-28 DIAGNOSIS — F32A Depression, unspecified: Secondary | ICD-10-CM | POA: Diagnosis present

## 2022-03-28 DIAGNOSIS — Z98891 History of uterine scar from previous surgery: Principal | ICD-10-CM

## 2022-03-28 DIAGNOSIS — F338 Other recurrent depressive disorders: Secondary | ICD-10-CM

## 2022-03-28 DIAGNOSIS — Z87891 Personal history of nicotine dependence: Secondary | ICD-10-CM

## 2022-03-28 DIAGNOSIS — O9832 Other infections with a predominantly sexual mode of transmission complicating childbirth: Secondary | ICD-10-CM | POA: Diagnosis present

## 2022-03-28 DIAGNOSIS — E119 Type 2 diabetes mellitus without complications: Secondary | ICD-10-CM | POA: Diagnosis present

## 2022-03-28 DIAGNOSIS — O0993 Supervision of high risk pregnancy, unspecified, third trimester: Secondary | ICD-10-CM

## 2022-03-28 DIAGNOSIS — Z794 Long term (current) use of insulin: Secondary | ICD-10-CM

## 2022-03-28 DIAGNOSIS — O9902 Anemia complicating childbirth: Secondary | ICD-10-CM | POA: Diagnosis present

## 2022-03-28 DIAGNOSIS — O169 Unspecified maternal hypertension, unspecified trimester: Secondary | ICD-10-CM | POA: Insufficient documentation

## 2022-03-28 DIAGNOSIS — O99213 Obesity complicating pregnancy, third trimester: Secondary | ICD-10-CM

## 2022-03-28 DIAGNOSIS — O165 Unspecified maternal hypertension, complicating the puerperium: Secondary | ICD-10-CM | POA: Insufficient documentation

## 2022-03-28 DIAGNOSIS — O98813 Other maternal infectious and parasitic diseases complicating pregnancy, third trimester: Secondary | ICD-10-CM

## 2022-03-28 DIAGNOSIS — O24113 Pre-existing diabetes mellitus, type 2, in pregnancy, third trimester: Secondary | ICD-10-CM

## 2022-03-28 DIAGNOSIS — O99343 Other mental disorders complicating pregnancy, third trimester: Secondary | ICD-10-CM

## 2022-03-28 DIAGNOSIS — O114 Pre-existing hypertension with pre-eclampsia, complicating childbirth: Principal | ICD-10-CM | POA: Diagnosis present

## 2022-03-28 DIAGNOSIS — O99344 Other mental disorders complicating childbirth: Secondary | ICD-10-CM | POA: Diagnosis present

## 2022-03-28 DIAGNOSIS — O1493 Unspecified pre-eclampsia, third trimester: Secondary | ICD-10-CM | POA: Diagnosis present

## 2022-03-28 DIAGNOSIS — O24319 Unspecified pre-existing diabetes mellitus in pregnancy, unspecified trimester: Secondary | ICD-10-CM | POA: Diagnosis present

## 2022-03-28 DIAGNOSIS — Z3A31 31 weeks gestation of pregnancy: Secondary | ICD-10-CM

## 2022-03-28 DIAGNOSIS — E1165 Type 2 diabetes mellitus with hyperglycemia: Secondary | ICD-10-CM

## 2022-03-28 DIAGNOSIS — O99210 Obesity complicating pregnancy, unspecified trimester: Secondary | ICD-10-CM | POA: Diagnosis present

## 2022-03-28 DIAGNOSIS — Z79899 Other long term (current) drug therapy: Secondary | ICD-10-CM

## 2022-03-28 DIAGNOSIS — O2412 Pre-existing diabetes mellitus, type 2, in childbirth: Secondary | ICD-10-CM | POA: Diagnosis present

## 2022-03-28 DIAGNOSIS — O1002 Pre-existing essential hypertension complicating childbirth: Secondary | ICD-10-CM | POA: Diagnosis present

## 2022-03-28 DIAGNOSIS — A6000 Herpesviral infection of urogenital system, unspecified: Secondary | ICD-10-CM | POA: Diagnosis present

## 2022-03-28 DIAGNOSIS — O119 Pre-existing hypertension with pre-eclampsia, unspecified trimester: Secondary | ICD-10-CM | POA: Insufficient documentation

## 2022-03-28 LAB — COMPREHENSIVE METABOLIC PANEL
ALT: 11 U/L (ref 0–44)
AST: 14 U/L — ABNORMAL LOW (ref 15–41)
Albumin: 2.4 g/dL — ABNORMAL LOW (ref 3.5–5.0)
Alkaline Phosphatase: 87 U/L (ref 38–126)
Anion gap: 6 (ref 5–15)
BUN: 9 mg/dL (ref 6–20)
CO2: 22 mmol/L (ref 22–32)
Calcium: 8.6 mg/dL — ABNORMAL LOW (ref 8.9–10.3)
Chloride: 108 mmol/L (ref 98–111)
Creatinine, Ser: 0.67 mg/dL (ref 0.44–1.00)
GFR, Estimated: >60 mL/min (ref >60)
Glucose, Bld: 138 mg/dL — ABNORMAL HIGH (ref 70–99)
Potassium: 4 mmol/L (ref 3.5–5.1)
Sodium: 136 mmol/L (ref 135–145)
Total Bilirubin: 0.4 mg/dL (ref 0.3–1.2)
Total Protein: 5.7 g/dL — ABNORMAL LOW (ref 6.5–8.1)

## 2022-03-28 LAB — POCT URINALYSIS DIPSTICK OB
Bilirubin, UA: NEGATIVE
Blood, UA: NEGATIVE
Glucose, UA: NEGATIVE
Ketones, UA: NEGATIVE
Leukocytes, UA: NEGATIVE
Nitrite, UA: NEGATIVE
Spec Grav, UA: 1.02 (ref 1.010–1.025)
Urobilinogen, UA: 0.2 E.U./dL
pH, UA: 6 (ref 5.0–8.0)

## 2022-03-28 LAB — CBC
HCT: 31.7 % — ABNORMAL LOW (ref 36.0–46.0)
Hemoglobin: 10.6 g/dL — ABNORMAL LOW (ref 12.0–15.0)
MCH: 28.5 pg (ref 26.0–34.0)
MCHC: 33.4 g/dL (ref 30.0–36.0)
MCV: 85.2 fL (ref 80.0–100.0)
Platelets: 328 10*3/uL (ref 150–400)
RBC: 3.72 MIL/uL — ABNORMAL LOW (ref 3.87–5.11)
RDW: 13.2 % (ref 11.5–15.5)
WBC: 12.1 10*3/uL — ABNORMAL HIGH (ref 4.0–10.5)
nRBC: 0 % (ref 0.0–0.2)

## 2022-03-28 LAB — PROTEIN / CREATININE RATIO, URINE
Creatinine, Urine: 229 mg/dL
Protein Creatinine Ratio: 0.31 mg/mg{Cre} — ABNORMAL HIGH (ref 0.00–0.15)
Total Protein, Urine: 70 mg/dL

## 2022-03-28 LAB — GLUCOSE, CAPILLARY
Glucose-Capillary: 141 mg/dL — ABNORMAL HIGH (ref 70–99)
Glucose-Capillary: 139 mg/dL — ABNORMAL HIGH (ref 70–99)

## 2022-03-28 MED ORDER — LABETALOL HCL 5 MG/ML IV SOLN
20.0000 mg | INTRAVENOUS | Status: DC | PRN
  Administered 2022-03-29 – 2022-04-02 (×4): 20 mg via INTRAVENOUS
  Filled 2022-03-28 (×9): qty 4

## 2022-03-28 MED ORDER — ONDANSETRON 4 MG PO TBDP
4.0000 mg | ORAL_TABLET | Freq: Four times a day (QID) | ORAL | Status: DC | PRN
  Administered 2022-03-28: 4 mg via ORAL
  Filled 2022-03-28: qty 1

## 2022-03-28 MED ORDER — NIFEDIPINE 10 MG PO CAPS
ORAL_CAPSULE | ORAL | Status: AC
  Filled 2022-03-28: qty 3

## 2022-03-28 MED ORDER — DIPHENHYDRAMINE HCL 25 MG PO CAPS
25.0000 mg | ORAL_CAPSULE | Freq: Once | ORAL | Status: AC
  Administered 2022-03-28: 25 mg via ORAL
  Filled 2022-03-28: qty 1

## 2022-03-28 MED ORDER — INSULIN GLARGINE-YFGN 100 UNIT/ML ~~LOC~~ SOLN
24.0000 [IU] | Freq: Every morning | SUBCUTANEOUS | Status: DC
  Administered 2022-03-29 – 2022-03-31 (×3): 24 [IU] via SUBCUTANEOUS
  Filled 2022-03-28 (×3): qty 0.24

## 2022-03-28 MED ORDER — NIFEDIPINE 10 MG PO CAPS
30.0000 mg | ORAL_CAPSULE | Freq: Once | ORAL | Status: AC
  Administered 2022-03-28: 30 mg via ORAL

## 2022-03-28 MED ORDER — METFORMIN HCL 500 MG PO TABS: 500.0000 mg | ORAL_TABLET | Freq: Every day | ORAL | 3 refills | Status: DC

## 2022-03-28 MED ORDER — ACETAMINOPHEN 500 MG PO TABS
1000.0000 mg | ORAL_TABLET | Freq: Four times a day (QID) | ORAL | Status: DC | PRN
  Administered 2022-03-29 – 2022-04-06 (×8): 1000 mg via ORAL
  Filled 2022-03-28 (×10): qty 2

## 2022-03-28 MED ORDER — LABETALOL HCL 5 MG/ML IV SOLN
80.0000 mg | INTRAVENOUS | Status: DC | PRN
  Administered 2022-04-02: 80 mg via INTRAVENOUS
  Filled 2022-03-28: qty 16

## 2022-03-28 MED ORDER — INSULIN GLARGINE-YFGN 100 UNIT/ML ~~LOC~~ SOLN
34.0000 [IU] | Freq: Every day | SUBCUTANEOUS | Status: DC
  Administered 2022-03-28 – 2022-03-29 (×2): 34 [IU] via SUBCUTANEOUS
  Filled 2022-03-28 (×2): qty 0.34

## 2022-03-28 MED ORDER — HYDRALAZINE HCL 20 MG/ML IJ SOLN
10.0000 mg | INTRAMUSCULAR | Status: DC | PRN
  Filled 2022-03-28: qty 1

## 2022-03-28 MED ORDER — PROCHLORPERAZINE MALEATE 10 MG PO TABS
10.0000 mg | ORAL_TABLET | Freq: Once | ORAL | Status: AC
  Administered 2022-03-28: 10 mg via ORAL
  Filled 2022-03-28: qty 1

## 2022-03-28 MED ORDER — BETAMETHASONE SOD PHOS & ACET 6 (3-3) MG/ML IJ SUSP
12.0000 mg | Freq: Once | INTRAMUSCULAR | Status: AC
  Administered 2022-03-29: 12 mg via INTRAMUSCULAR
  Filled 2022-03-28: qty 5

## 2022-03-28 MED ORDER — BETAMETHASONE SOD PHOS & ACET 6 (3-3) MG/ML IJ SUSP
12.0000 mg | Freq: Once | INTRAMUSCULAR | Status: AC
  Administered 2022-03-28: 12 mg via INTRAMUSCULAR
  Filled 2022-03-28: qty 5

## 2022-03-28 MED ORDER — NIFEDIPINE ER OSMOTIC RELEASE 30 MG PO TB24
30.0000 mg | ORAL_TABLET | Freq: Once | ORAL | Status: AC
  Administered 2022-03-28: 30 mg via ORAL
  Filled 2022-03-28: qty 1

## 2022-03-28 MED ORDER — METOCLOPRAMIDE HCL 10 MG PO TABS
10.0000 mg | ORAL_TABLET | Freq: Once | ORAL | Status: AC
  Administered 2022-03-28: 10 mg via ORAL
  Filled 2022-03-28: qty 1

## 2022-03-28 MED ORDER — LACTATED RINGERS IV SOLN
INTRAVENOUS | Status: DC
  Administered 2022-03-29: 50 mL via INTRAVENOUS

## 2022-03-28 MED ORDER — LABETALOL HCL 5 MG/ML IV SOLN
40.0000 mg | INTRAVENOUS | Status: DC | PRN
  Administered 2022-03-29 – 2022-04-02 (×2): 40 mg via INTRAVENOUS
  Filled 2022-03-28 (×4): qty 8

## 2022-03-28 MED ORDER — LABETALOL HCL 100 MG PO TABS
200.0000 mg | ORAL_TABLET | Freq: Three times a day (TID) | ORAL | Status: DC
  Administered 2022-03-28: 200 mg via ORAL
  Filled 2022-03-28: qty 2

## 2022-03-28 MED ORDER — LABETALOL HCL 100 MG PO TABS
200.0000 mg | ORAL_TABLET | Freq: Once | ORAL | Status: AC
  Administered 2022-03-28: 200 mg via ORAL
  Filled 2022-03-28: qty 2

## 2022-03-28 NOTE — Progress Notes (Signed)
Patricia Mcbride is a 42 y.o. G3P0020 at 72w1dby early ultrasound admitted funder observation due to elevated blood pressures. Her [facility] Villagelabs with a UPC of .310 have now established her as having Superimposed  Pre eclampsia with Chronic Hypertension.   Subjective:As the afternoon has progressed, she has developed a headache. She has had some nausea and several episodes of vomiting.    Objective: BP (!) 161/83   Pulse 86   Temp 98.2 F (36.8 C) (Oral)   Resp 18   LMP 08/14/2021 (Exact Date)  I/O last 3 completed shifts: In: -  Out: 200 [Emesis/NG output:200] No intake/output data recorded.  FHT:  FHR: 145 bpm, variability: moderate,  accelerations:  Present,  decelerations:  Present rare, brief variable.  Fetus is very active per the patient's report. UC:   some uterine irritability noted after two episodes of vomiting. SVE:      Labs: Lab Results  Component Value Date   WBC 12.1 (H) 03/28/2022   HGB 10.6 (L) 03/28/2022   HCT 31.7 (L) 03/28/2022   MCV 85.2 03/28/2022   PLT 328 03/28/2022    Assessment / Plan: 42year old G3P0, at 31 weeks 1 day gestation, admitted with elevated blood pressures New Dx of mild superimposed pre eclampsia , chronic HTN Reactive NST Insulin dependent Type 2 DM Pedal edema Antihypertensive medications  being used to address her blood pressures. Plan was discussed with Dr. SOuida Sillsat approximately 1800 this evening.  Admit under observation for the night. Labetalol 200 mg po TID Glargine insulin 34 units this evening Diabetic diet Diabetic consult ordered for the AM Celexa 40 mg po in the AM NSTs q shift Blood pressures q `1 hour overnight. Consider calling IV team for repeated severe range pressures  Will provide compazine, reglen and Benedryl to address her headache and N/V.  Repeat Betamethasone tomorrow for second dose.  Monitor blood pressures carefully. May add Procardia 30 XL if pressures do not respond to  labetalol.  MImagene Riches CNM  03/28/2022 10:35 PM      MImagene Riches CNM 03/28/2022, 10:22 PM

## 2022-03-28 NOTE — Progress Notes (Signed)
PRENATAL VISIT NOTE  Subjective:  Patricia Mcbride is a 42 y.o. G3P0020 at 28w1dbeing seen today for ongoing prenatal care.  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  Patient reports no complaints.  Contractions: Not present. Vag. Bleeding: None.  Movement: Present. Denies leaking of fluid.   The following portions of the patient's history were reviewed and updated as appropriate: allergies, current medications, past family history, past medical history, past social history, past surgical history and problem list.   Objective:   Vitals:   03/28/22 0854  BP: (!) 175/90  Pulse: 96  Weight: 297 lb 14.4 oz (135.1 kg)    Fetal Status:     Movement: Present     General:  Alert, oriented and cooperative. Patient is in no acute distress.  Skin: Skin is warm and dry. No rash noted.   Cardiovascular: Normal heart rate noted  Respiratory: Normal respiratory effort, no problems with respiration noted  Abdomen: Soft, gravid, appropriate for gestational age.  Pain/Pressure: Present     Pelvic: Cervical exam deferred        Extremities: Normal range of motion.  Edema: Mild pitting, slight indentation  Mental  Status: Normal mood and affect. Normal behavior. Normal judgment and thought content.  4+ pedal edema, DTR 2+ Assessment and Plan:  Pregnancy: G3P0020 at 315w1d. Type 2 diabetes mellitus with hyperglycemia, without long-term current use of insulin ([place]- She reports good sugar control, did not bring any sugars. - reports that she ran out of metformin about 2 days ago (I will refill this). - [place]FM OB FOLLOW UP  2. Supervision of high risk pregnancy in third trimester  - US[place]FM OB FOLLOW UP - POC Urinalysis Dipstick OB  3. Other obesity due to excess calories affecting pregnancy in third trimester  - US[place]FM OB FOLLOW UP  4. Hypertension affecting pregnancy in third trimester  - US[place]FM OB FOLLOW UP  5. HSV-2 (herpes simplex virus 2) infection - start valtrex in the near future  6. Major depressive disorder, recurrent episode with anxious distress ([place]  7. Multigravida of advanced maternal age in third trimester - low risk NIPS  8. [redacted] weeks gestation of pregnancy  - POC Urinalysis Dipstick OB  9. Elevated BP, no headache, visual changes, RUQ pain - to labor hall for further evaluation. I spoke with the midwife on [place]  Preterm labor symptoms and general obstetric precautions including but not limited to vaginal bleeding, contractions, leaking of fluid and fetal movement were reviewed in detail with the patient. Please refer to After Visit Summary for other counseling recommendations.   Return in about 1 week (around 04/04/2022) for with weekly NST.  Future Appointments  Date Time Provider [place]3/13/2024  2:00 PM [facility]/14[facility]2024  3:35 PM ChRubie MaidMD [facility]-A[facility] None  04/10/2022  9:00 AM [facility]  [place]  04/17/2022  8:35 AM Rubie Maid, MD [facility] None  04/17/2022 10:00 AM [facility]    Emily Filbert, MD

## 2022-03-28 NOTE — Progress Notes (Signed)
ROB [redacted]w[redacted]d She is doing well. She has some concerns about her blood pressure being elevated, she does not check her blood pressure. She has some edema in her legs and feet that is painful and burning.

## 2022-03-28 NOTE — Progress Notes (Signed)
Patient has been held in Observation room with labile blood pressures.   Numerous attempts have been made to start an IV line in order to push IV Labetalol without success. The IV team was called to place an IV, but the patient 's blood pressures responded to po medication, so IV placement has been held.   Subjective: Patient reports slight headache. Ample fetal movement. She denies any visual changes. Her legs feel swollen. She ate a bacon, egg and cheese biscuit this morning and her legs have become more swollen since breakfast.    Objective: I have reviewed patient's vital signs, medications, and labs. Results for orders placed or performed during the hospital encounter of 03/28/22 (from the past 24 hour(s))  Protein / creatinine ratio, urine     Status: Abnormal   Collection Time: 03/28/22 10:08 AM  Result Value Ref Range   Creatinine, Urine 229 mg/dL   Total Protein, Urine 70 mg/dL   Protein Creatinine Ratio 0.31 (H) 0.00 - 0.15 mg/mg[Cre]  Glucose, capillary     Status: Abnormal   Collection Time: 03/28/22 10:15 AM  Result Value Ref Range   Glucose-Capillary 141 (H) 70 - 99 mg/dL  CBC     Status: Abnormal   Collection Time: 03/28/22 10:24 AM  Result Value Ref Range   WBC 12.1 (H) 4.0 - 10.5 K/uL   RBC 3.72 (L) 3.87 - 5.11 MIL/uL   Hemoglobin 10.6 (L) 12.0 - 15.0 g/dL   HCT 31.7 (L) 36.0 - 46.0 %   MCV 85.2 80.0 - 100.0 fL   MCH 28.5 26.0 - 34.0 pg   MCHC 33.4 30.0 - 36.0 g/dL   RDW 13.2 11.5 - 15.5 %   Platelets 328 150 - 400 K/uL   nRBC 0.0 0.0 - 0.2 %  Comprehensive metabolic panel     Status: Abnormal   Collection Time: 03/28/22 10:24 AM  Result Value Ref Range   Sodium 136 135 - 145 mmol/L   Potassium 4.0 3.5 - 5.1 mmol/L   Chloride 108 98 - 111 mmol/L   CO2 22 22 - 32 mmol/L   Glucose, Bld 138 (H) 70 - 99 mg/dL   BUN 9 6 - 20 mg/dL   Creatinine, Ser 0.67 0.44 - 1.00 mg/dL   Calcium 8.6 (L) 8.9 - 10.3 mg/dL   Total Protein 5.7 (L) 6.5 - 8.1 g/dL   Albumin 2.4  (L) 3.5 - 5.0 g/dL   AST 14 (L) 15 - 41 U/L   ALT 11 0 - 44 U/L   Alkaline Phosphatase 87 38 - 126 U/L   Total Bilirubin 0.4 0.3 - 1.2 mg/dL   GFR, Estimated >60 >60 mL/min   Anion gap 6 5 - 15  Glucose, capillary     Status: Abnormal   Collection Time: 03/28/22  9:13 PM  Result Value Ref Range   Glucose-Capillary 139 (H) 70 - 99 mg/dL   See flowsheets for blood pressures  Fetal heart tones - NST- Normal baseline, moderate variabiltiy, accels present, decels absent.    Assessment/Plan: Discussed plan of care with Dr. Marcelline Mates Will administer Procardia immediate release at 30 mg. Consider Procardai XL in one hour if BPs still elevated.   LOS: 0 days    Imagene Riches, CNM 03/28/2022, 10:06 PM

## 2022-03-28 NOTE — H&P (Signed)
Patricia Mcbride is a 42 y.o. female presenting for a Pre eclampsia workup. She was seen at the office today for a return OB visit, and her blood pressure was 175/90.  She has also noticed an increase in leg and pedal edema.She was sent to the Labor and Delivery unit for serial blood pressures and some lab work. OB History     Gravida  3   Para  0   Term  0   Preterm  0   AB  2   Living  0      SAB      IAB  1   Ectopic  1   Multiple      Live Births             Past Medical History:  Diagnosis Date   Depression    Diabetes ([place])    01/2021   GERD (gastroesophageal reflux disease)    Jaundice of newborn    Past Surgical History:  Procedure Laterality Date   ESOPHAGOGASTRODUODENOSCOPY (EGD) WITH PROPOFOL N/A 01/19/2019   Procedure: ESOPHAGOGASTRODUODENOSCOPY (EGD) WITH PROPOFOL;  Surgeon: Lucilla Lame, MD;  Location: [facility] ENDOSCOPY;  Service: Endoscopy;  Laterality: N/A;   WISDOM TOOTH EXTRACTION     3 at diff times; early 82s   Family History: family history includes Alzheimer's disease in her paternal grandfather and paternal grandmother; Asthma in her maternal grandmother and mother; Cancer (age of onset: 52) in her father; Dementia in her paternal grandfather and paternal grandmother; Depression in her father, mother, sister, and sister; Diabetes in her father and maternal grandmother; Hearing loss in her maternal grandmother and sister; Heart disease in her maternal grandmother, paternal grandfather, and paternal grandmother; Hyperlipidemia in her father, maternal grandmother, paternal grandfather, and paternal grandmother; Hypertension in her father, maternal grandmother, paternal grandfather, paternal grandmother, and sister; Kidney disease in her maternal grandmother, paternal grandfather, and paternal grandmother; Miscarriages / [place] in her sister. Social History:  reports that she has quit smoking. She has never used smokeless tobacco. She reports  that she does not currently use alcohol. She reports that she does not currently use drugs.     Maternal Diabetes: Yes:  Diabetes Type:  Pre-pregnancy, Insulin/Medication controlled She has been on both Metformin and Insulin. Recently stopped taking the Metformin. Genetic Screening: Normal Maternal Ultrasounds/Referrals: Normal Fetal Ultrasounds or other Referrals:  Referred to Materal Fetal Medicine  Maternal Substance Abuse:  No Significant Maternal Medications:  Meds include: Other:  Celexa Significant Maternal Lab Results:  None Number of Prenatal Visits:greater than 3 verified prenatal visits Other Comments:  None FHTS: reactive , with normal baseline range, moderate variability and accels present. No decels present. Cat 1. Review of Systems  Constitutional: Negative.   HENT: Negative.    Eyes: Negative.   Respiratory: Negative.    Cardiovascular:  Positive for leg swelling.  Gastrointestinal: Negative.   Endocrine: Negative.   Genitourinary: Negative.   Musculoskeletal: Negative.   Skin: Negative.   Allergic/Immunologic: Negative.   Neurological: Negative.   Hematological: Negative.   Psychiatric/Behavioral:  The patient is nervous/anxious.        Hx of MDD. Anxious today.  Notable pedal edema. History   Blood pressure (!) 148/68, pulse 91, temperature 98.2 F (36.8 C), temperature source Oral, resp. rate 20, last menstrual period 08/14/2021. Exam Physical Exam Constitutional:      Appearance: She is obese.     Comments: BMI 52  HENT:     Head: Normocephalic and atraumatic.  Cardiovascular:     Rate and Rhythm: Normal rate and regular rhythm.     Pulses: Normal pulses.     Heart sounds: Normal heart sounds.     Comments: Hypertension noted upon admission. See flowsheet. Some systolic measures meet level of severe range Pulmonary:     Effort: Pulmonary effort is normal.     Breath sounds: Normal breath sounds.  Abdominal:     Palpations: Abdomen is soft.      Comments: SNT is reactive, moderate variability. No contractions noted per toco or reported. Gravid abdomen, High BMI.   Genitourinary:    Comments: deferred Musculoskeletal:        General: Normal range of motion.  Skin:    General: Skin is warm and dry.  Neurological:     General: No focal deficit present.     Mental Status: She is alert and oriented to person, place, and time.  Psychiatric:        Mood and Affect: Mood normal.        Behavior: Behavior normal.     Comments: Expressing fear of needle sticks/injections     Prenatal labs: ABO, Rh: B/Positive/-- (10/13 0835) Antibody: Negative (10/13 0835) Rubella: 7.91 (10/13 0835) RPR: Non Reactive (02/15 0912)  HBsAg: Negative (10/13 0835)  HIV: Non Reactive (11/10 0911)  GBS:    Results for orders placed or performed during the hospital encounter of 03/28/22 (from the past 24 hour(s))  Protein / creatinine ratio, urine     Status: Abnormal   Collection Time: 03/28/22 10:08 AM  Result Value Ref Range   Creatinine, Urine 229 mg/dL   Total Protein, Urine 70 mg/dL   Protein Creatinine Ratio 0.31 (H) 0.00 - 0.15 mg/mg[Cre]  Glucose, capillary     Status: Abnormal   Collection Time: 03/28/22 10:15 AM  Result Value Ref Range   Glucose-Capillary 141 (H) 70 - 99 mg/dL  CBC     Status: Abnormal   Collection Time: 03/28/22 10:24 AM  Result Value Ref Range   WBC 12.1 (H) 4.0 - 10.5 K/uL   RBC 3.72 (L) 3.87 - 5.11 MIL/uL   Hemoglobin 10.6 (L) 12.0 - 15.0 g/dL   HCT 31.7 (L) 36.0 - 46.0 %   MCV 85.2 80.0 - 100.0 fL   MCH 28.5 26.0 - 34.0 pg   MCHC 33.4 30.0 - 36.0 g/dL   RDW 13.2 11.5 - 15.5 %   Platelets 328 150 - 400 K/uL   nRBC 0.0 0.0 - 0.2 %  Comprehensive metabolic panel     Status: Abnormal   Collection Time: 03/28/22 10:24 AM  Result Value Ref Range   Sodium 136 135 - 145 mmol/L   Potassium 4.0 3.5 - 5.1 mmol/L   Chloride 108 98 - 111 mmol/L   CO2 22 22 - 32 mmol/L   Glucose, Bld 138 (H) 70 - 99 mg/dL   BUN 9 6 -  20 mg/dL   Creatinine, Ser 0.67 0.44 - 1.00 mg/dL   Calcium 8.6 (L) 8.9 - 10.3 mg/dL   Total Protein 5.7 (L) 6.5 - 8.1 g/dL   Albumin 2.4 (L) 3.5 - 5.0 g/dL   AST 14 (L) 15 - 41 U/L   ALT 11 0 - 44 U/L   Alkaline Phosphatase 87 38 - 126 U/L   Total Bilirubin 0.4 0.3 - 1.2 mg/dL   GFR, Estimated >60 >60 mL/min   Anion gap 6 5 - 15  Glucose, capillary     Status: Abnormal  Collection Time: 03/28/22  9:13 PM  Result Value Ref Range   Glucose-Capillary 139 (H) 70 - 99 mg/dL    Assessment/Plan: 42 year old Gravida 3 Para 0, at 31 weeks 1 day gestation severe range blood pressures at the [facility] office today. - Sent for work up for Pre eclampsia  Risk factors: Morbid obesity -CHTN with severe range pressures today requiring po medication. Super imposed mild pre -eclampsia Diabetes  Type 2 on Insulin- per EPIC chart, inconsistent glucose monitoring. MDD- daily Celexa  Will admit for observation. POC discussed with Dr. Marcelline Mates. PO Labetalol to address HTN May use Procardia. [place] labs reviewed. IV requested NSTs q shift Will evaluate patient's response to labetalol. -   Imagene Riches 03/28/2022, 9:38 PM

## 2022-03-28 NOTE — Progress Notes (Signed)
Patient's cuff loose on arm

## 2022-03-28 NOTE — OB Triage Note (Signed)
Patient sent over from office with elevated blood pressures.  No c/o headaches, vision changes, or RUQ pain.  Patient has noted swelling in legs and feet recently.  Monitors applied, urine obtained, serial blood pressures started, and labs to be drawn.

## 2022-03-29 DIAGNOSIS — D509 Iron deficiency anemia, unspecified: Secondary | ICD-10-CM | POA: Diagnosis present

## 2022-03-29 DIAGNOSIS — R030 Elevated blood-pressure reading, without diagnosis of hypertension: Secondary | ICD-10-CM | POA: Diagnosis present

## 2022-03-29 DIAGNOSIS — A6000 Herpesviral infection of urogenital system, unspecified: Secondary | ICD-10-CM | POA: Diagnosis present

## 2022-03-29 DIAGNOSIS — Z818 Family history of other mental and behavioral disorders: Secondary | ICD-10-CM | POA: Diagnosis not present

## 2022-03-29 DIAGNOSIS — O9832 Other infections with a predominantly sexual mode of transmission complicating childbirth: Secondary | ICD-10-CM | POA: Diagnosis present

## 2022-03-29 DIAGNOSIS — F419 Anxiety disorder, unspecified: Secondary | ICD-10-CM | POA: Diagnosis present

## 2022-03-29 DIAGNOSIS — O99344 Other mental disorders complicating childbirth: Secondary | ICD-10-CM | POA: Diagnosis present

## 2022-03-29 DIAGNOSIS — Z794 Long term (current) use of insulin: Secondary | ICD-10-CM | POA: Diagnosis not present

## 2022-03-29 DIAGNOSIS — E119 Type 2 diabetes mellitus without complications: Secondary | ICD-10-CM | POA: Diagnosis present

## 2022-03-29 DIAGNOSIS — O2412 Pre-existing diabetes mellitus, type 2, in childbirth: Secondary | ICD-10-CM | POA: Diagnosis present

## 2022-03-29 DIAGNOSIS — O114 Pre-existing hypertension with pre-eclampsia, complicating childbirth: Secondary | ICD-10-CM | POA: Diagnosis present

## 2022-03-29 DIAGNOSIS — O24113 Pre-existing diabetes mellitus, type 2, in pregnancy, third trimester: Secondary | ICD-10-CM | POA: Diagnosis not present

## 2022-03-29 DIAGNOSIS — O1002 Pre-existing essential hypertension complicating childbirth: Secondary | ICD-10-CM | POA: Diagnosis present

## 2022-03-29 DIAGNOSIS — O1413 Severe pre-eclampsia, third trimester: Secondary | ICD-10-CM | POA: Diagnosis not present

## 2022-03-29 DIAGNOSIS — O09293 Supervision of pregnancy with other poor reproductive or obstetric history, third trimester: Secondary | ICD-10-CM | POA: Diagnosis not present

## 2022-03-29 DIAGNOSIS — Z87891 Personal history of nicotine dependence: Secondary | ICD-10-CM | POA: Diagnosis not present

## 2022-03-29 DIAGNOSIS — Z3A31 31 weeks gestation of pregnancy: Secondary | ICD-10-CM | POA: Diagnosis not present

## 2022-03-29 DIAGNOSIS — O09523 Supervision of elderly multigravida, third trimester: Secondary | ICD-10-CM | POA: Diagnosis not present

## 2022-03-29 DIAGNOSIS — Z7982 Long term (current) use of aspirin: Secondary | ICD-10-CM | POA: Diagnosis not present

## 2022-03-29 DIAGNOSIS — Z79899 Other long term (current) drug therapy: Secondary | ICD-10-CM | POA: Diagnosis not present

## 2022-03-29 DIAGNOSIS — O9902 Anemia complicating childbirth: Secondary | ICD-10-CM | POA: Diagnosis present

## 2022-03-29 DIAGNOSIS — O1493 Unspecified pre-eclampsia, third trimester: Secondary | ICD-10-CM

## 2022-03-29 DIAGNOSIS — O99343 Other mental disorders complicating pregnancy, third trimester: Secondary | ICD-10-CM | POA: Diagnosis not present

## 2022-03-29 DIAGNOSIS — O1414 Severe pre-eclampsia complicating childbirth: Secondary | ICD-10-CM | POA: Diagnosis not present

## 2022-03-29 DIAGNOSIS — O43123 Velamentous insertion of umbilical cord, third trimester: Secondary | ICD-10-CM | POA: Diagnosis present

## 2022-03-29 DIAGNOSIS — Z7984 Long term (current) use of oral hypoglycemic drugs: Secondary | ICD-10-CM | POA: Diagnosis not present

## 2022-03-29 DIAGNOSIS — O99214 Obesity complicating childbirth: Secondary | ICD-10-CM | POA: Diagnosis present

## 2022-03-29 DIAGNOSIS — F339 Major depressive disorder, recurrent, unspecified: Secondary | ICD-10-CM | POA: Diagnosis present

## 2022-03-29 LAB — GLUCOSE, CAPILLARY
Glucose-Capillary: 143 mg/dL — ABNORMAL HIGH (ref 70–99)
Glucose-Capillary: 138 mg/dL — ABNORMAL HIGH (ref 70–99)
Glucose-Capillary: 144 mg/dL — ABNORMAL HIGH (ref 70–99)
Glucose-Capillary: 151 mg/dL — ABNORMAL HIGH (ref 70–99)

## 2022-03-29 LAB — GLUCOSE, RANDOM: Glucose, Bld: 159 mg/dL — ABNORMAL HIGH (ref 70–99)

## 2022-03-29 MED ORDER — MAGNESIUM SULFATE 40 GM/1000ML IV SOLN
2.0000 g/h | INTRAVENOUS | Status: DC
  Administered 2022-03-29 – 2022-04-03 (×6): 2 g/h via INTRAVENOUS
  Filled 2022-03-29 (×5): qty 1000

## 2022-03-29 MED ORDER — MAGNESIUM SULFATE BOLUS VIA INFUSION
4.0000 g | Freq: Once | INTRAVENOUS | Status: AC
  Administered 2022-03-29: 4 g via INTRAVENOUS
  Filled 2022-03-29: qty 1000

## 2022-03-29 MED ORDER — NIFEDIPINE ER OSMOTIC RELEASE 30 MG PO TB24
30.0000 mg | ORAL_TABLET | Freq: Two times a day (BID) | ORAL | Status: DC
  Administered 2022-03-29 – 2022-03-30 (×3): 30 mg via ORAL
  Filled 2022-03-29 (×3): qty 1

## 2022-03-29 MED ORDER — CALCIUM GLUCONATE 10 % IV SOLN
INTRAVENOUS | Status: AC
  Filled 2022-03-29: qty 10

## 2022-03-29 MED ORDER — LABETALOL HCL 5 MG/ML IV SOLN
20.0000 mg | INTRAVENOUS | Status: DC | PRN
  Administered 2022-03-30 – 2022-04-02 (×3): 20 mg via INTRAVENOUS

## 2022-03-29 MED ORDER — ONDANSETRON HCL 4 MG/2ML IJ SOLN
4.0000 mg | Freq: Four times a day (QID) | INTRAMUSCULAR | Status: DC | PRN
  Administered 2022-03-30 – 2022-04-01 (×4): 4 mg via INTRAVENOUS
  Filled 2022-03-29 (×4): qty 2

## 2022-03-29 MED ORDER — LABETALOL HCL 5 MG/ML IV SOLN: 80.0000 mg | INTRAVENOUS | Status: DC | PRN

## 2022-03-29 MED ORDER — ONDANSETRON HCL 4 MG/2ML IJ SOLN
INTRAMUSCULAR | Status: AC
  Administered 2022-03-29: 4 mg via INTRAVENOUS
  Filled 2022-03-29: qty 2

## 2022-03-29 MED ORDER — INSULIN ASPART 100 UNIT/ML IJ SOLN
0.0000 [IU] | Freq: Three times a day (TID) | INTRAMUSCULAR | Status: DC
  Administered 2022-03-29 – 2022-03-30 (×4): 3 [IU] via SUBCUTANEOUS
  Administered 2022-03-30 (×2): 0 [IU] via SUBCUTANEOUS
  Administered 2022-04-01: 3 [IU] via SUBCUTANEOUS
  Filled 2022-03-29 (×5): qty 1

## 2022-03-29 MED ORDER — LABETALOL HCL 5 MG/ML IV SOLN
40.0000 mg | INTRAVENOUS | Status: DC | PRN
  Administered 2022-03-30 – 2022-04-02 (×4): 40 mg via INTRAVENOUS
  Filled 2022-03-29 (×2): qty 8

## 2022-03-29 MED ORDER — LACTATED RINGERS IV SOLN: INTRAVENOUS | Status: DC

## 2022-03-29 MED ORDER — CITALOPRAM HYDROBROMIDE 20 MG PO TABS
40.0000 mg | ORAL_TABLET | Freq: Every day | ORAL | Status: DC
  Administered 2022-03-29 – 2022-04-06 (×9): 40 mg via ORAL
  Filled 2022-03-29 (×9): qty 2

## 2022-03-29 MED ORDER — HYDRALAZINE HCL 20 MG/ML IJ SOLN
10.0000 mg | INTRAMUSCULAR | Status: DC | PRN
  Administered 2022-03-31: 10 mg via INTRAVENOUS

## 2022-03-29 NOTE — Progress Notes (Signed)
ANTEPARTUM  PROGRESS NOTE  Patricia Mcbride is a 42 y.o. G3P0020 at 44w2dwho is admitted for observation with mild pre eclampsia.  Estimated Date of Delivery: 05/29/22 Fetal presentation is unsure.  Length of Stay:  0 Days. Admitted 03/28/2022  Subjective: Pt experiencing a headache 4/10 pain scale. She was treated for headache earlier this morning that resolved with tylenol. It has returned and she is more uncomfortable with this headache. She denies any visual changes or epigastric pain .    Vitals:  Blood pressure (!) 150/76, pulse 98, temperature 98.5 F (36.9 C), temperature source Oral, resp. rate 16, last menstrual period 08/14/2021. Physical Examination:  NEUROLOGIC: Alert and oriented to person, place, and time. Normal reflexes, muscle tone coordination.  + headache.  PSYCHIATRIC: Normal mood and affect. Normal behavior. Normal judgment and thought content. CARDIOVASCULAR: Normal heart rate noted, regular rhythm RESPIRATORY: Effort and breath sounds normal, no problems with respiration noted MUSCULOSKELETAL: Normal range of motion. Edema + 2 pitting bilaterally and no tenderness. 2+ distal pulses.  ABDOMEN: Soft, nontender, nondistended, gravid. CERVIX:  deferred   Fetal monitoring: NST reactive  Uterine activity: pt denies contractions, abdomen soft   Results for orders placed or performed during the hospital encounter of 03/28/22 (from the past 48 hour(s))  Protein / creatinine ratio, urine     Status: Abnormal   Collection Time: 03/28/22 10:08 AM  Result Value Ref Range   Creatinine, Urine 229 mg/dL   Total Protein, Urine 70 mg/dL    Comment: NO NORMAL RANGE ESTABLISHED FOR THIS TEST   Protein Creatinine Ratio 0.31 (H) 0.00 - 0.15 mg/mg[Cre]    Comment: [facility] [address] [place] [address] Glucose, capillary     Status: Abnormal   Collection Time: 03/28/22 10:15 AM  Result Value Ref Range   Glucose-Capillary 141 (H) 70 -  99 mg/dL    Comment: Glucose reference range applies only to samples taken after fasting for at least 8 hours.  CBC     Status: Abnormal   Collection Time: 03/28/22 10:24 AM  Result Value Ref Range   WBC 12.1 (H) 4.0 - 10.5 K/uL   RBC 3.72 (L) 3.87 - 5.11 MIL/uL   Hemoglobin 10.6 (L) 12.0 - 15.0 g/dL   HCT 31.7 (L) 36.0 - 46.0 %   MCV 85.2 80.0 - 100.0 fL   MCH 28.5 26.0 - 34.0 pg   MCHC 33.4 30.0 - 36.0 g/dL   RDW 13.2 11.5 - 15.5 %   Platelets 328 150 - 400 K/uL   nRBC 0.0 0.0 - 0.2 %    Comment: [facility] [address], [place] [id number] Comprehensive metabolic panel     Status: Abnormal   Collection Time: 03/28/22 10:24 AM  Result Value Ref Range   Sodium 136 135 - 145 mmol/L   Potassium 4.0 3.5 - 5.1 mmol/L   Chloride 108 98 - 111 mmol/L   CO2 22 22 - 32 mmol/L   Glucose, Bld 138 (H) 70 - 99 mg/dL    Comment: Glucose reference range applies only to samples taken after fasting for at least 8 hours.   BUN 9 6 - 20 mg/dL   Creatinine, Ser 0.67 0.44 - 1.00 mg/dL   Calcium 8.6 (L) 8.9 - 10.3 mg/dL   Total Protein 5.7 (L) 6.5 - 8.1 g/dL   Albumin 2.4 (L) 3.5 - 5.0 g/dL   AST 14 (L) 15 - 41  U/L   ALT 11 0 - 44 U/L   Alkaline Phosphatase 87 38 - 126 U/L   Total Bilirubin 0.4 0.3 - 1.2 mg/dL   GFR, Estimated >60 >60 mL/min    Comment: (NOTE) Calculated using the CKD-EPI Creatinine Equation (2021)    Anion gap 6 5 - 15    Comment: Performed at [facility], [address] [place], [place] [address]  Glucose, capillary     Status: Abnormal   Collection Time: 03/28/22  9:13 PM  Result Value Ref Range   Glucose-Capillary 139 (H) 70 - 99 mg/dL    Comment: Glucose reference range applies only to samples taken after fasting for at least 8 hours.  Glucose, random     Status: Abnormal   Collection Time: 03/29/22  6:01 AM  Result Value Ref Range   Glucose, Bld 159 (H) 70 - 99 mg/dL    Comment: Glucose reference range applies only to  samples taken after fasting for at least 8 hours. Performed at [facility], [address] [place], [place] [address]   Glucose, capillary     Status: Abnormal   Collection Time: 03/29/22  7:34 AM  Result Value Ref Range   Glucose-Capillary 143 (H) 70 - 99 mg/dL    Comment: Glucose reference range applies only to samples taken after fasting for at least 8 hours.  Glucose, capillary     Status: Abnormal   Collection Time: 03/29/22 11:00 AM  Result Value Ref Range   Glucose-Capillary 138 (H) 70 - 99 mg/dL    Comment: Glucose reference range applies only to samples taken after fasting for at least 8 hours.  Glucose, capillary     Status: Abnormal   Collection Time: 03/29/22  2:47 PM  Result Value Ref Range   Glucose-Capillary 144 (H) 70 - 99 mg/dL    Comment: Glucose reference range applies only to samples taken after fasting for at least 8 hours.    No results found.  Current scheduled medications  calcium gluconate       citalopram  40 mg Oral Daily   insulin aspart  0-20 Units Subcutaneous TID WC   insulin glargine-yfgn  24 Units Subcutaneous q morning   insulin glargine-yfgn  34 Units Subcutaneous QHS   magnesium  4 g Intravenous Once   NIFEdipine  30 mg Oral BID    I have reviewed the patient's current medications.  ASSESSMENT: Principal Problem:   Hypertension in pregnancy, antepartum Active Problems:   Chronic hypertension with superimposed pre-eclampsia   Labor and delivery, indication for care   PLAN: Consulted Dr. Marcelline Mates on plan of care. Orders place for 24 hr urine protein and Magnesium sulfate therapy 4g bolus then 2 g hr.   Continue routine antenatal care.   Philip Aspen, CNM

## 2022-03-29 NOTE — Progress Notes (Addendum)
ANTEPARTUM COMPREHENSIVE PROGRESS NOTE  Patricia Mcbride is a 42 y.o. G3P0020 at 52w2dwho is admitted for observation with Mild pre eclampsia.  Estimated Date of Delivery: 05/29/22 Fetal presentation is unsure.  Length of Stay:  0 Days. Admitted 03/28/2022  Subjective: Pt denies visual disturbance, epigastric pain . She states she had a mild headache that has resolved with eating.   Patient reports good fetal movement.  She reports no uterine contractions, no bleeding and no loss of fluid per vagina.  Vitals:  Blood pressure (!) 165/92, pulse 97, temperature 98 F (36.7 C), temperature source Oral, resp. rate 18, last menstrual period 08/14/2021. Physical Examination: CONSTITUTIONAL: Well-developed, well-nourished female in no acute distress.  HENT:  Normocephalic, atraumatic, External right and left ear normal. Oropharynx is clear and moist EYES: Conjunctivae and EOM are normal. Pupils are equal, round, and reactive to light. No scleral icterus.  NECK: Normal range of motion, supple, no masses SKIN: Skin is warm and dry. No rash noted. Not diaphoretic. No erythema. No pallor. [place] Alert and oriented to person, place, and time. Normal reflexes, muscle tone coordination. No cranial nerve deficit noted. PSYCHIATRIC: Normal mood and affect. Normal behavior. Normal judgment and thought content. CARDIOVASCULAR: Normal heart rate noted, regular rhythm RESPIRATORY: Effort and breath sounds normal, no problems with respiration noted MUSCULOSKELETAL: Normal range of motion. Edema +2 pitting just below the knee bilaterally, and no tenderness. 2+ distal pulses, No clonus ABDOMEN: Soft, nontender, nondistended, gravid. CERVIX:  deferred   Fetal monitoring: FHR: 140 bpm, Variability: moderate, Accelerations: Present, Decelerations: Absent . Reactive NST, category 1 strip. Uterine activity: none contractions per hour  Results for orders placed or performed during the hospital encounter of  03/28/22 (from the past 48 hour(s))  Protein / creatinine ratio, urine     Status: Abnormal   Collection Time: 03/28/22 10:08 AM  Result Value Ref Range   Creatinine, Urine 229 mg/dL   Total Protein, Urine 70 mg/dL    Comment: NO NORMAL RANGE ESTABLISHED FOR THIS TEST   Protein Creatinine Ratio 0.31 (H) 0.00 - 0.15 mg/mg[Cre]    Comment: [facility]formed at [facility] [address], [place] [address] Glucose, capillary     Status: Abnormal   Collection Time: 03/28/22 10:15 AM  Result Value Ref Range   Glucose-Capillary 141 (H) 70 - 99 mg/dL    Comment: Glucose reference range applies only to samples taken after fasting for at least 8 hours.  CBC     Status: Abnormal   Collection Time: 03/28/22 10:24 AM  Result Value Ref Range   WBC 12.1 (H) 4.0 - 10.5 K/uL   RBC 3.72 (L) 3.87 - 5.11 MIL/uL   Hemoglobin 10.6 (L) 12.0 - 15.0 g/dL   HCT 31.7 (L) 36.0 - 46.0 %   MCV 85.2 80.0 - 100.0 fL   MCH 28.5 26.0 - 34.0 pg   MCHC 33.4 30.0 - 36.0 g/dL   RDW 13.2 11.5 - 15.5 %   Platelets 328 150 - 400 K/uL   nRBC 0.0 0.0 - 0.2 %    Comment: [facility]formed at [facility] [address], [place] [address] Comprehensive metabolic panel     Status: Abnormal   Collection Time: 03/28/22 10:24 AM  Result Value Ref Range   Sodium 136 135 - 145 mmol/L   Potassium 4.0 3.5 - 5.1 mmol/L   Chloride 108 98 - 111 mmol/L   CO2 22 22 - 32 mmol/L   Glucose,  Bld 138 (H) 70 - 99 mg/dL    Comment: Glucose reference range applies only to samples taken after fasting for at least 8 hours.   BUN 9 6 - 20 mg/dL   Creatinine, Ser 0.67 0.44 - 1.00 mg/dL   Calcium 8.6 (L) 8.9 - 10.3 mg/dL   Total Protein 5.7 (L) 6.5 - 8.1 g/dL   Albumin 2.4 (L) 3.5 - 5.0 g/dL   AST 14 (L) 15 - 41 U/L   ALT 11 0 - 44 U/L   Alkaline Phosphatase 87 38 - 126 U/L   Total Bilirubin 0.4 0.3 - 1.2 mg/dL   GFR, Estimated >60 >60 mL/min    Comment: (NOTE) Calculated using the CKD-EPI Creatinine Equation  (2021)    Anion gap 6 5 - 15    Comment: Performed at [facility], [address] [place], [place] [address]  Glucose, capillary     Status: Abnormal   Collection Time: 03/28/22  9:13 PM  Result Value Ref Range   Glucose-Capillary 139 (H) 70 - 99 mg/dL    Comment: Glucose reference range applies only to samples taken after fasting for at least 8 hours.  Glucose, random     Status: Abnormal   Collection Time: 03/29/22  6:01 AM  Result Value Ref Range   Glucose, Bld 159 (H) 70 - 99 mg/dL    Comment: Glucose reference range applies only to samples taken after fasting for at least 8 hours. Performed at [facility], [address] [place], [place] [address]   Glucose, capillary     Status: Abnormal   Collection Time: 03/29/22  7:34 AM  Result Value Ref Range   Glucose-Capillary 143 (H) 70 - 99 mg/dL    Comment: Glucose reference range applies only to samples taken after fasting for at least 8 hours.    No results found.  Current scheduled medications  betamethasone acetate-betamethasone sodium phosphate  12 mg Intramuscular Once   citalopram  40 mg Oral Daily   insulin aspart  0-20 Units Subcutaneous TID WC   insulin glargine-yfgn  24 Units Subcutaneous q morning   insulin glargine-yfgn  34 Units Subcutaneous QHS   NIFEdipine  30 mg Oral BID    I have reviewed the patient's current medications.  ASSESSMENT: Principal Problem:   Hypertension in pregnancy, antepartum Active Problems:   Chronic hypertension with superimposed pre-eclampsia   PLAN: Consulted Dr. Marcelline Mates on plan of care. Labetalol discontinued. Procardia XL 30 mg BID ordered. Discussed monitoring BS for possible adjustment of medications. Second dose of betamethasone. Possible discharge later this evening.    Continue routine antenatal care.  Philip Aspen, CNM

## 2022-03-30 DIAGNOSIS — O24113 Pre-existing diabetes mellitus, type 2, in pregnancy, third trimester: Secondary | ICD-10-CM

## 2022-03-30 DIAGNOSIS — O99343 Other mental disorders complicating pregnancy, third trimester: Secondary | ICD-10-CM

## 2022-03-30 DIAGNOSIS — E119 Type 2 diabetes mellitus without complications: Secondary | ICD-10-CM

## 2022-03-30 DIAGNOSIS — Z794 Long term (current) use of insulin: Secondary | ICD-10-CM

## 2022-03-30 DIAGNOSIS — F418 Other specified anxiety disorders: Secondary | ICD-10-CM

## 2022-03-30 DIAGNOSIS — O1413 Severe pre-eclampsia, third trimester: Secondary | ICD-10-CM

## 2022-03-30 DIAGNOSIS — O1493 Unspecified pre-eclampsia, third trimester: Secondary | ICD-10-CM | POA: Diagnosis present

## 2022-03-30 DIAGNOSIS — O09523 Supervision of elderly multigravida, third trimester: Secondary | ICD-10-CM

## 2022-03-30 DIAGNOSIS — Z3A31 31 weeks gestation of pregnancy: Secondary | ICD-10-CM

## 2022-03-30 DIAGNOSIS — Z7984 Long term (current) use of oral hypoglycemic drugs: Secondary | ICD-10-CM

## 2022-03-30 LAB — COMPREHENSIVE METABOLIC PANEL
ALT: 11 U/L (ref 0–44)
AST: 11 U/L — ABNORMAL LOW (ref 15–41)
Albumin: 2.5 g/dL — ABNORMAL LOW (ref 3.5–5.0)
Alkaline Phosphatase: 83 U/L (ref 38–126)
Anion gap: 9 (ref 5–15)
BUN: 8 mg/dL (ref 6–20)
CO2: 18 mmol/L — ABNORMAL LOW (ref 22–32)
Calcium: 8 mg/dL — ABNORMAL LOW (ref 8.9–10.3)
Chloride: 108 mmol/L (ref 98–111)
Creatinine, Ser: 0.56 mg/dL (ref 0.44–1.00)
GFR, Estimated: >60 mL/min (ref >60)
Glucose, Bld: 150 mg/dL — ABNORMAL HIGH (ref 70–99)
Potassium: 4.2 mmol/L (ref 3.5–5.1)
Sodium: 135 mmol/L (ref 135–145)
Total Bilirubin: 0.7 mg/dL (ref 0.3–1.2)
Total Protein: 5.9 g/dL — ABNORMAL LOW (ref 6.5–8.1)

## 2022-03-30 LAB — GLUCOSE, CAPILLARY
Glucose-Capillary: 116 mg/dL — ABNORMAL HIGH (ref 70–99)
Glucose-Capillary: 140 mg/dL — ABNORMAL HIGH (ref 70–99)
Glucose-Capillary: 144 mg/dL — ABNORMAL HIGH (ref 70–99)
Glucose-Capillary: 119 mg/dL — ABNORMAL HIGH (ref 70–99)
Glucose-Capillary: 145 mg/dL — ABNORMAL HIGH (ref 70–99)

## 2022-03-30 LAB — CBC
HCT: 32.8 % — ABNORMAL LOW (ref 36.0–46.0)
Hemoglobin: 10.7 g/dL — ABNORMAL LOW (ref 12.0–15.0)
MCH: 28.4 pg (ref 26.0–34.0)
MCHC: 32.6 g/dL (ref 30.0–36.0)
MCV: 87 fL (ref 80.0–100.0)
Platelets: 353 10*3/uL (ref 150–400)
RBC: 3.77 MIL/uL — ABNORMAL LOW (ref 3.87–5.11)
RDW: 13.2 % (ref 11.5–15.5)
WBC: 15.1 10*3/uL — ABNORMAL HIGH (ref 4.0–10.5)
nRBC: 0 % (ref 0.0–0.2)

## 2022-03-30 LAB — PROTEIN, URINE, 24 HOUR
Collection Interval-UPROT: 24 hours
Protein, 24H Urine: 1088 mg/d — ABNORMAL HIGH (ref 50–100)
Protein, Urine: 34 mg/dL
Urine Total Volume-UPROT: 3200 mL

## 2022-03-30 MED ORDER — NIFEDIPINE ER OSMOTIC RELEASE 30 MG PO TB24
60.0000 mg | ORAL_TABLET | Freq: Two times a day (BID) | ORAL | Status: DC
  Administered 2022-03-30: 30 mg via ORAL
  Administered 2022-03-30: 60 mg via ORAL
  Filled 2022-03-30: qty 2

## 2022-03-30 MED ORDER — METFORMIN HCL 500 MG PO TABS
500.0000 mg | ORAL_TABLET | Freq: Two times a day (BID) | ORAL | Status: DC
  Administered 2022-03-30 – 2022-04-03 (×7): 500 mg via ORAL
  Filled 2022-03-30 (×9): qty 1

## 2022-03-30 MED ORDER — NIFEDIPINE ER OSMOTIC RELEASE 30 MG PO TB24
ORAL_TABLET | ORAL | Status: AC
  Filled 2022-03-30: qty 1

## 2022-03-30 MED ORDER — INSULIN GLARGINE-YFGN 100 UNIT/ML ~~LOC~~ SOLN
38.0000 [IU] | Freq: Every day | SUBCUTANEOUS | Status: DC
  Administered 2022-03-30 – 2022-04-02 (×3): 38 [IU] via SUBCUTANEOUS
  Filled 2022-03-30 (×4): qty 0.38

## 2022-03-30 MED ORDER — ENOXAPARIN SODIUM 40 MG/0.4ML IJ SOSY
40.0000 mg | PREFILLED_SYRINGE | INTRAMUSCULAR | Status: DC
  Administered 2022-03-30 – 2022-04-01 (×3): 40 mg via SUBCUTANEOUS
  Filled 2022-03-30 (×3): qty 0.4

## 2022-03-30 NOTE — Progress Notes (Addendum)
Antenatal Progress Note  Subjective:     Patient ID: Patricia Mcbride is a 42 y.o. G3P0020  female [redacted]w[redacted]d, Estimated Date of Delivery: 05/29/22 dated by 1st trimester sono ([redacted]w[redacted]d) who was admitted for pre-eclampsia with severe features, Type II DM.  Is s/p full course of betamethasone. Also with a h/o anxiety/depression, currently on Celexa.  HD# 2.   Subjective:  Patient denies complaints today.  Notes that her headache has finally resolved. Feeling much better today. Currently on magnesium sulfate. Does report that she had a disagreement with her mother right before her most recent blood pressure was taken, which may account for the elevation.   Review of Systems Denies contractions, leakage of fluids, vaginal bleeding, and reports good fetal movement.     Objective:   Temp:  [98.4 F (36.9 C)-99 F (37.2 C)] 98.8 F (37.1 C) (03/09 0630) Pulse Rate:  [83-106] 83 (03/09 1108) Resp:  [16-22] 20 (03/09 0630) BP: (132-181)/(58-84) 144/68 (03/09 1108) SpO2:  [93 %-100 %] 99 % (03/09 1105) Weight:  [135.1 kg] 135.1 kg (03/09 0225)   General appearance: alert and no distress Lungs: clear to auscultation bilaterally Heart: regular rate and rhythm, S1, S2 normal, no murmur, click, rub or gallop Abdomen: soft, non-tender; bowel sounds normal; no masses,  no organomegaly, gravid.  Pelvic: deferred Extremities: edema +2 pitting bilaterally.  No redness or tenderness in the calves or thighs Neuro: reflexes intact, no clonus.    Fetal Tracing Interpretation for 03/30/22  Indication: Diabetes, Advanced Maternal Age >40 years, and Pre-eclampsia  Fetal Heart Rate A Mode: External Baseline Rate (A): 135 bpm Variability: Moderate Accelerations: 10 x 10 Decelerations: None  Uterine Activity Mode: None Contraction Frequency (min): none reported Contraction Duration (sec): 20-90 Contraction Quality: Mild Resting Tone Palpated: Relaxed Resting Time: Adequate   Labs:      Latest Ref Rng & Units 03/30/2022    5:40 AM 03/28/2022   10:24 AM 02/28/2022   11:31 AM  CBC  WBC 4.0 - 10.5 K/uL 15.1  12.1  13.9   Hemoglobin 12.0 - 15.0 g/dL 16.1  09.6  04.5   Hematocrit 36.0 - 46.0 % 32.8  31.7  33.2   Platelets 150 - 400 K/uL 353  328  336       Latest Ref Rng & Units 03/30/2022    5:40 AM 03/29/2022    6:01 AM 03/28/2022   10:24 AM  CMP  Glucose 70 - 99 mg/dL 409  811  914   BUN 6 - 20 mg/dL 8   9   Creatinine 7.82 - 1.00 mg/dL 9.56   2.13   Sodium 086 - 145 mmol/L 135   136   Potassium 3.5 - 5.1 mmol/L 4.2   4.0   Chloride 98 - 111 mmol/L 108   108   CO2 22 - 32 mmol/L 18   22   Calcium 8.9 - 10.3 mg/dL 8.0   8.6   Total Protein 6.5 - 8.1 g/dL 5.9   5.7   Total Bilirubin 0.3 - 1.2 mg/dL 0.7   0.4   Alkaline Phos 38 - 126 U/L 83   87   AST 15 - 41 U/L 11   14   ALT 0 - 44 U/L 11   11      Latest Reference Range & Units 02/28/22 11:19 03/28/22 10:08  Protein Creatinine Ratio 0.00 - 0.15 mg/mgCre 0.09 0.31 (H)  (H): Data is abnormally high  Imaging:  [place] MFM OB  FOLLOW UP ----------------------------------------------------------------------  OBSTETRICS REPORT                       (Signed Final 02/28/2022 10:55 am) ---------------------------------------------------------------------- Patient Info  ID #:       [id number]                          D.O.B.:  27-Sep-1980 (41 yrs)  Name:       Patricia Mcbride            Visit Date: 02/28/2022 09:37 am ---------------------------------------------------------------------- Performed By  Attending:        Lin Landsman      Ref. Address:     Salomon Mast                    MD                                                             [address]                                                             [place]                                                             [address]  Performed By:     Eden Lathe BS      [facility]                    RDMS RVT                                 [facility] at                                                             [facility]  Referred By:      Courtney Heys                    [facility] ---------------------------------------------------------------------- Orders  #  Description  Code        Ordered By  1  [place] MFM OB FOLLOW UP                   [id number]    Lin Landsman ----------------------------------------------------------------------  #  Order #                     Accession #                Episode #  1  [id number]                   [id number]                 [id number] ---------------------------------------------------------------------- Indications  Pre-existing diabetes, type 2, in pregnancy,   O24.112  second trimester (insulin,metformin)  Advanced maternal age multigravida 38+,        O7.522  second trimester  Obesity complicating pregnancy, second         O85.212  trimester  NIPS-LR female  [redacted] weeks gestation of pregnancy                Z3A.27  Antenatal follow-up for nonvisualized fetal    Z36.2  anatomy ---------------------------------------------------------------------- Vital Signs                                                 Height:        5'2" ---------------------------------------------------------------------- Fetal Evaluation  Num Of Fetuses:         1  Fetal Heart Rate(bpm):  162  Cardiac Activity:       Observed  Presentation:           Cephalic  Placenta:               Posterior  P. Cord Insertion:      Marginal insertion  Amniotic Fluid  AFI FV:      Within normal limits  AFI Sum(cm)     %Tile       Largest Pocket(cm)  21.22           87          6.16  RUQ(cm)       RLQ(cm)       LUQ(cm)        LLQ(cm)  6.03          5.47          3.56            6.16 ---------------------------------------------------------------------- Biometry  BPD:     70.13  mm     G. Age:  28w 1d         73  %    CI:        77.44   %    70 - 86  FL/HC:      19.3   %    18.6 - 20.4  HC:    252.29   mm     G. Age:  27w 3d         29  %    HC/AC:      1.11        1.05 - 1.21  AC:    227.07   mm     G. Age:  27w 1d         40  %    FL/BPD:     69.4   %    71 - 87  FL:       48.7  mm     G. Age:  26w 3d         15  %    FL/AC:      21.4   %    20 - 24  LV:        4.7  mm  Est. FW:    1003  gm      2 lb 3 oz     30  % ---------------------------------------------------------------------- OB History  Gravidity:    3          SAB:   2  Living:       0 ---------------------------------------------------------------------- Gestational Age  LMP:           28w 2d        Date:  08/14/21                  EDD:   05/21/22  U/S Today:     27w 2d                                        EDD:   05/28/22  Best:          27w 1d     Det. By:  Marcella Dubs         EDD:   05/29/22                                      (11/19/21) ---------------------------------------------------------------------- Anatomy  Cranium:               Appears normal         Aortic Arch:            Previously seen  Cavum:                 Appears normal         Ductal Arch:            Appears normal  Ventricles:            Appears normal         Diaphragm:              Appears normal  Choroid Plexus:        Previously seen        Stomach:                Appears normal, left  sided  Cerebellum:            Previously seen        Abdomen:                Appears normal  Posterior Fossa:       Previously seen        Abdominal Wall:         Previously seen  Nuchal Fold:           Previously seen        Cord Vessels:           Previously seen  Face:                  Appears normal          Kidneys:                Appear normal                         (orbits and profile)  Lips:                  Previously seen        Bladder:                Appears normal  Thoracic:              Appears normal         Spine:                  Previously seen  Heart:                 Appears normal         Upper Extremities:      Previously seen                         (4CH, axis, and                         situs)  RVOT:                  Appears normal         Lower Extremities:      Previously seen  LVOT:                  Appears normal  Other:  Heels/feet and open hands/5th digits prev visualized. VC, nasal bone,          lenses, maxilla, mandible and falx visualized VC, 3VV and 3VTV          visualized. Technically difficult due to maternal habitus. ---------------------------------------------------------------------- Cervix Uterus Adnexa  Cervix  Not visualized (advanced GA >24wks)  Uterus  No abnormality visualized.  Right Ovary  Not visualized.  Left Ovary  Not visualized.  Cul De Sac  No free fluid seen.  Adnexa  No abnormality visualized ---------------------------------------------------------------------- Impression  Follow up growth to complete the fetaal anatomy and also  given T2DM, BMI and AMA  Normal interval growth with measurements consistent with  dates  Good fetal movement and amniotic fluid volume  Suboptimal views of the fetal anatomy in was again seen  secondary to fetal position and maternal habitus.  We observed a marginal cord insertion. We will continue  serial growth as consistent with T2DM.  She is scheduled for a fetal echocardiogram to complete the  fetal  heart.  Today we observed her blood pressure elevated: 139/90,  144/83 and 152/84 mmHg. She is asymptomatic.  I discussed today's visit she denies s/sx of preeclampisa,  Nevertheless given her risk factors we have sent her to L&D  for [facility] labs.  She will continue serial growth in 4-5 weeks. We  will continue  weekly testing at 32 weeks.  I discussed this plan of care with Dr. Valentino Saxon who will follow up  on her labs and she will consider making an earlier  appointment in the week instead of the 2/15 to assess her  blood pressure. ---------------------------------------------------------------------- Recommendations  Follow up growth in 4-5 weeks.  Initiate weekly testing at 32 weeks. ----------------------------------------------------------------------              Lin Landsman, MD Electronically Signed Final Report   02/28/2022 10:55 am ----------------------------------------------------------------------    Assessment:  42 y.o.  G3P0020 female [redacted]w[redacted]d, with:   Pre-eclampsia with severe features in third trimester (previously gestational HTN) Type II DM in pregnancy Advanced maternal age in third trimester Anxiety and depression Obesity in pregnancy (BMI 52) History of HSV   Plan:   Pre-eclampsia with severe features in third trimester (previously gestational HTN) - Currently on Procardia 30 mg BID however still having moderate pressures with occasional severe range requiring IV treatment. Will increase to 60 mg BID.  - Continue Magnesium sulfate until 5 pm today.  - Is now s/p full course of antenatal steroids.  - Discussed with MFM regarding patient.  Would consider delivery only if patient continues to be symptomatic (pulmonary edema, unresolved headache), blood pressures uncontrolled on 2 agents, worsening lab abnormalities (progression to HELLP).  - Continuous fetal monitoring - Ted Hose - Pending 24 hr urine collection - Will start Lovenox for DVT prophylaxis as patient likely to remain inpatient for now.  - Will be due for growth scan early next week. Will order sooner if delivery is imminent.  - Neonatology consult placed.   Type II DM in pregnancy - Currently on home insulin regimen (24 units lantus AM, 34 units PM).  Need to restart Metformin.   Increased evening dose to 38 units added coverage as slightly uncontrolled likely due to receiving steroids.   Advanced maternal age in third trimester - Patient has had genetic screening in current pregnancy, normal.   Anxiety and depression - Continue Celexa daily.   Obesity in pregnancy (BMI 52) - For repeat Anesthesia consult at 34 weeks unless imminent delivery     A total of 50 minutes were spent during this encounter, including review of previous progress notes, recent imaging and labs, face-to-face with time with patient involving counseling and coordination of care, as well as documentation for current visit.    Hildred Laser, MD [facility]

## 2022-03-30 NOTE — Consult Note (Signed)
.  Consultation Service: Neonatology   Dr. Marcelline Mates has asked for consultation on Shanielle Housden Reamy regarding the care of a premature infant at 31+[redacted] weeks gestational age. Thank you for inviting [place] to see this patient.   Reason for consult:  Explain the possible complications, the prognosis, and the care of a premature infant at 29 and 3/7 weeks.  I have reviewed the patient's chart and have met with her. The salient information is as follows: : 42 y.o. female with a single IUP at 31+ weeks, who is currently hospitalized due to [facility] w/ superimposed PreE requiring escalating medical management. She is not in labor, no ROM, and cervix is closed.  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   She has been followed by MFM.  Fetal growth has been reassuring, with most recent EFW ~ 1000 g on 02/28/22 @ 28+1, 30%ile (down slightly from 40%ile '@21wk'$ ).  A fetal echo on 02/28/22 was normal.  Fetal anatomy estimated normal.  Since admission on 03/28/22, developed PreE, requiring escalating meds for bp control, now on procardia, labatelol, Mg.  There is no plan for delivery emergently unless change in maternal fetal status.  Otherwise, likely delivery would be recommended at 34 wk.     Prenatal labs: Reviewed.  MBT B+, AB neg, RPR NR, HBsAg neg, HIV NR, GBS UNK. +h/o HSV   Prenatal care:   good Pregnancy complications:  See above.  T2DM, depression, AMA, high BMI, cHTN, h/o HSV Maternal antibiotics: none Maternal Steroids: BMZ Most recent dose:  03/29/22 @ 57   My recommendations for this patient and my actions included:   1. In the presence of the [employer] Crow and her husband, I spent 30 minutes discussing the possible complications and outcomes of prematurity at this gestational age. I  discussed specific complications at this gestational age referencing the need for possible resuscitation at birth, mechanical ventilation and surfactant administration for respiratory distress, IV fluids pending establishment of enteral feeds (encouraged breast milk feeding), antibiotics for possible sepsis, temperature support, and monitoring. I also discussed the potential risk of complications such as intracranial hemorrhage, retinopathy, and chronic lung disease. I discussed this with parents in detail and they expressed an understanding of the risks and complications of prematurity.   2. I also discussed the expected chance of survival of an infant born at 31+ weeks GA which is quite good. We further discussed that very few of the neonates born at this age have profound or severe neurological complications and school difficulties. However, a relatively small proportion of the neonates born at this age will have some for of mild to moderate neurological complications. She expressed an understanding of this information.   3. I informed her that the NICU team would be present at the delivery. She agreed that all appropriate medical measures should be taken to resuscitate her infant at the delivery.   4. Visitation policy was discussed.   Final Impression:  42 y.o. female with a single viable IUP who has developed pre eclampsia and who now understands the possible complications and prognosis of her infant should preterm delivery become indicated. The mother understands and agrees with the plan for resuscitation and ICU care. Rebekha Flammang Geoffroy's and her husbands questions were answered. She is planning to try and provide breast milk for her infant.  ______________________________________________________________________  Thank you for asking [place] to participate in the care of this patient. Please do not hesitate to contact us again if you are aware of any further ways we can be of assistance.    Sincerely,  Denna Haggard, MD Attending Neonatologist   I spent ~40 minutes in consultation time, of which 25 minutes was spent in direct face to face counseling.

## 2022-03-31 ENCOUNTER — Inpatient Hospital Stay: Payer: Managed Care, Other (non HMO)

## 2022-03-31 DIAGNOSIS — O24113 Pre-existing diabetes mellitus, type 2, in pregnancy, third trimester: Secondary | ICD-10-CM | POA: Diagnosis not present

## 2022-03-31 DIAGNOSIS — O1413 Severe pre-eclampsia, third trimester: Secondary | ICD-10-CM | POA: Diagnosis not present

## 2022-03-31 DIAGNOSIS — E119 Type 2 diabetes mellitus without complications: Secondary | ICD-10-CM | POA: Diagnosis not present

## 2022-03-31 DIAGNOSIS — O99343 Other mental disorders complicating pregnancy, third trimester: Secondary | ICD-10-CM | POA: Diagnosis not present

## 2022-03-31 LAB — COMPREHENSIVE METABOLIC PANEL
ALT: 10 U/L (ref 0–44)
AST: 14 U/L — ABNORMAL LOW (ref 15–41)
Albumin: 2.5 g/dL — ABNORMAL LOW (ref 3.5–5.0)
Alkaline Phosphatase: 85 U/L (ref 38–126)
Anion gap: 9 (ref 5–15)
BUN: 8 mg/dL (ref 6–20)
CO2: 19 mmol/L — ABNORMAL LOW (ref 22–32)
Calcium: 8 mg/dL — ABNORMAL LOW (ref 8.9–10.3)
Chloride: 106 mmol/L (ref 98–111)
Creatinine, Ser: 0.67 mg/dL (ref 0.44–1.00)
GFR, Estimated: >60 mL/min (ref >60)
Glucose, Bld: 115 mg/dL — ABNORMAL HIGH (ref 70–99)
Potassium: 3.9 mmol/L (ref 3.5–5.1)
Sodium: 134 mmol/L — ABNORMAL LOW (ref 135–145)
Total Bilirubin: 0.5 mg/dL (ref 0.3–1.2)
Total Protein: 6 g/dL — ABNORMAL LOW (ref 6.5–8.1)

## 2022-03-31 LAB — CBC
HCT: 32.9 % — ABNORMAL LOW (ref 36.0–46.0)
Hemoglobin: 10.7 g/dL — ABNORMAL LOW (ref 12.0–15.0)
MCH: 28.5 pg (ref 26.0–34.0)
MCHC: 32.5 g/dL (ref 30.0–36.0)
MCV: 87.7 fL (ref 80.0–100.0)
Platelets: 352 10*3/uL (ref 150–400)
RBC: 3.75 MIL/uL — ABNORMAL LOW (ref 3.87–5.11)
RDW: 13.4 % (ref 11.5–15.5)
WBC: 15 10*3/uL — ABNORMAL HIGH (ref 4.0–10.5)
nRBC: 0 % (ref 0.0–0.2)

## 2022-03-31 LAB — MAGNESIUM: Magnesium: 3.5 mg/dL — ABNORMAL HIGH (ref 1.7–2.4)

## 2022-03-31 LAB — GLUCOSE, CAPILLARY
Glucose-Capillary: 74 mg/dL (ref 70–99)
Glucose-Capillary: 101 mg/dL — ABNORMAL HIGH (ref 70–99)
Glucose-Capillary: 122 mg/dL — ABNORMAL HIGH (ref 70–99)
Glucose-Capillary: 106 mg/dL — ABNORMAL HIGH (ref 70–99)

## 2022-03-31 MED ORDER — LABETALOL HCL 100 MG PO TABS
300.0000 mg | ORAL_TABLET | Freq: Three times a day (TID) | ORAL | Status: DC
  Administered 2022-03-31 (×2): 300 mg via ORAL
  Filled 2022-03-31 (×2): qty 3

## 2022-03-31 MED ORDER — SODIUM CHLORIDE 0.9 % IV SOLN
25.0000 mg | Freq: Four times a day (QID) | INTRAVENOUS | Status: DC | PRN
  Administered 2022-03-31: 25 mg via INTRAVENOUS
  Filled 2022-03-31: qty 1

## 2022-03-31 MED ORDER — LABETALOL HCL 100 MG PO TABS
200.0000 mg | ORAL_TABLET | Freq: Two times a day (BID) | ORAL | Status: DC
  Administered 2022-03-31: 200 mg via ORAL
  Filled 2022-03-31: qty 2

## 2022-03-31 MED ORDER — MAGNESIUM SULFATE BOLUS VIA INFUSION
4.0000 g | Freq: Once | INTRAVENOUS | Status: AC
  Administered 2022-03-31: 4 g via INTRAVENOUS
  Filled 2022-03-31: qty 1000

## 2022-03-31 MED ORDER — MAGNESIUM SULFATE 40 GM/1000ML IV SOLN
2.0000 g/h | INTRAVENOUS | Status: DC
  Administered 2022-03-31 – 2022-04-02 (×3): 2 g/h via INTRAVENOUS
  Filled 2022-03-31: qty 1000

## 2022-03-31 MED ORDER — LACTATED RINGERS IV SOLN: INTRAVENOUS | Status: DC

## 2022-03-31 MED ORDER — INSULIN GLARGINE-YFGN 100 UNIT/ML ~~LOC~~ SOLN
28.0000 [IU] | Freq: Every morning | SUBCUTANEOUS | Status: DC
  Administered 2022-04-01 – 2022-04-02 (×2): 28 [IU] via SUBCUTANEOUS
  Filled 2022-03-31 (×3): qty 0.28

## 2022-03-31 MED ORDER — NIFEDIPINE ER OSMOTIC RELEASE 30 MG PO TB24
120.0000 mg | ORAL_TABLET | Freq: Every day | ORAL | Status: DC
  Administered 2022-03-31 – 2022-04-02 (×3): 120 mg via ORAL
  Filled 2022-03-31 (×3): qty 4

## 2022-03-31 NOTE — Progress Notes (Signed)
[facility] Anesthesia Consultation  Patricia Mcbride MVH:[id number] DOB: 03-Nov-1980 DOA: 03/28/2022 PCP: Berniece Salines, FNP   Requesting physician: Dr. Valentino Saxon Date of consultation: 10/Mar/2024  Reason for consultation: Obesity during pregnancy  CHIEF COMPLAINT:  Obesity during pregnancy  HISTORY OF PRESENT ILLNESS: Patricia Mcbride  is a 42 y.o. female with a known history of type 2 diabetes, pre eclampsia, and morbid obesity who is presenting for anesthesia evaluation to determine appropriateness for delivery at [facility].  PAST MEDICAL HISTORY:   Past Medical History:  Diagnosis Date   Depression    Diabetes (HCC)    01/2021   GERD (gastroesophageal reflux disease)    Jaundice of newborn     PAST SURGICAL HISTORY:  Past Surgical History:  Procedure Laterality Date   ESOPHAGOGASTRODUODENOSCOPY (EGD) WITH PROPOFOL N/A 01/19/2019   Procedure: ESOPHAGOGASTRODUODENOSCOPY (EGD) WITH PROPOFOL;  Surgeon: Midge Minium, MD;  Location: [facility] ENDOSCOPY;  Service: Endoscopy;  Laterality: N/A;   WISDOM TOOTH EXTRACTION     3 at diff times; early 34s    SOCIAL HISTORY:  Social History   Tobacco Use   Smoking status: Former   Smokeless tobacco: Never  Substance Use Topics   Alcohol use: Not Currently    Comment: socially    FAMILY HISTORY:  Family History  Problem Relation Age of Onset   Depression Mother    Asthma Mother    Depression Father    Hypertension Father    Cancer Father 42       skin   Diabetes Father    Hyperlipidemia Father    Depression Sister    Hearing loss Sister    Miscarriages / Stillbirths Sister    Depression Sister    Hypertension Sister    Asthma Maternal Grandmother    Diabetes Maternal Grandmother    Hearing loss Maternal Grandmother    Heart disease Maternal Grandmother    Hyperlipidemia Maternal Grandmother    Hypertension Maternal Grandmother    Kidney disease Maternal Grandmother    Dementia Paternal  Grandmother    Kidney disease Paternal Grandmother    Hypertension Paternal Grandmother    Hyperlipidemia Paternal Grandmother    Heart disease Paternal Grandmother    Alzheimer's disease Paternal Grandmother    Alzheimer's disease Paternal Grandfather    Heart disease Paternal Grandfather    Hyperlipidemia Paternal Grandfather    Hypertension Paternal Grandfather    Kidney disease Paternal Grandfather    Dementia Paternal Grandfather     DRUG ALLERGIES:  Allergies  Allergen Reactions   Morphine Nausea Only    REVIEW OF SYSTEMS:   RESPIRATORY: No cough, shortness of breath, wheezing.  CARDIOVASCULAR: No chest pain, orthopnea, edema.  HEMATOLOGY: No anemia, easy bruising or bleeding SKIN: No rash or lesion. NEUROLOGIC: No tingling, numbness, weakness.  PSYCHIATRY: No anxiety or depression.   MEDICATIONS AT HOME:  Prior to Admission medications   Medication Sig Start Date End Date Taking? Authorizing Provider  aspirin EC 81 MG tablet Take 1 tablet (81 mg total) by mouth daily. Take after 12 weeks for prevention of preeclampssia later in pregnancy 12/07/21  Yes Hildred Laser, MD  citalopram (CELEXA) 20 MG tablet Take 2 tablets (40 mg total) by mouth daily. 11/16/21  Yes Mirna Mires, CNM  famotidine (PEPCID) 20 MG tablet Take 20 mg by mouth daily.   Yes [provider]  Glucagon (GVOKE HYPOPEN 1-PACK) 1 MG/0.2ML SOAJ Inject 1 mg into the skin as needed (for hypoglycemin). 10/03/21  Yes Pender,  Rudolpho Sevin, FNP  insulin glargine, 1 Unit Dial, (TOUJEO) 300 UNIT/ML Solostar Pen Inject 18 units in AM with breakfast, and 24 units in PM with dinner Patient taking differently: 46 Units daily. Inject 24 units in AM with breakfast, and 34 units in PM with dinner 02/25/22  Yes Hildred Laser, MD  metFORMIN (GLUCOPHAGE) 500 MG tablet Take 1 tablet (500 mg total) by mouth at bedtime. 03/28/22  Yes Dove, Myra C, MD  Prenatal Vit-Fe Fumarate-FA (MULTIVITAMIN-PRENATAL) 27-0.8 MG TABS tablet  Take 1 tablet by mouth daily at 12 noon.   Yes [provider]  Insulin Pen Needle (NOVOFINE PEN NEEDLE) 32G X 6 MM MISC 1 each by Does not apply route once a week. 06/29/21   Berniece Salines, FNP      PHYSICAL EXAMINATION:   VITAL SIGNS: Blood pressure (!) 171/81, pulse 95, temperature 37 C, temperature source Oral, resp. rate 18, height 5' 2.99" (1.6 m), weight 131.7 kg, last menstrual period 08/14/2021, SpO2 98 %.  GENERAL:  42 y.o.-year-old patient no acute distress.  HEENT: Head atraumatic, normocephalic. Oropharynx and nasopharynx clear. MP 1, TM distance >3 cm, normal mouth opening. LUNGS: No use of accessory muscles of respiration.   EXTREMITIES: No pedal edema, cyanosis, or clubbing.  NEUROLOGIC: normal gait PSYCHIATRIC: The patient is alert and oriented x 3.  SKIN: No obvious rash, lesion, or ulcer.    IMPRESSION AND PLAN:   Patricia Mcbride  is a 42 y.o. female presenting with obesity during pregnancy. BMI is currently 51.46 at almost [redacted] weeks gestation.   We discussed analgesic options during labor including epidural analgesia. Discussed that in obesity there can be increased difficulty with epidural placement or even failure of successful epidural. We also discussed that even after successful epidural placement there is increased risk of catheter migration out of the epidural space that would require catheter replacement. Discussed use of epidural vs spinal vs GA if cesarean delivery is required. Discussed increased risk of difficult intubation during pregnancy should an emergency cesarean delivery be required.  Given patient's airway evaluation we feel that [facility] is an appropriate location for her to deliver and do not recommend a higher level of care at this time.  In discussing with Dr. Valentino Saxon it sounds like the patient will be staying in house to monitor her pre eclampsia until she delivers.  In discussion with the patient, she may choose an elective Caesarean delivery if  it is an option.  We are happy to help and take care of the patient in any way we are needed.  Thank you for this consult.  Consuella Lose, MD Dept of Anesthesiology

## 2022-03-31 NOTE — Progress Notes (Signed)
Obstetrics and Gynecology Attending Note   Notified by nurse that patient is complaining of SON. Recently restarted on Magnesium Sulfate due to severe range BPs approximately 4 hours ago. Magnesium infusion held, vitals stable with normal pulse ox values. Stat CXR and repeat [place] labs ordered. Patient without complaints of headache or blurred vision, but is noting some nausea and vomiting.   Reconsulted Dr. Carlis Abbott (MFM), up dated on patient's condition. Still recommends delivery for worsening pre-eclampsia (progression to HELLP) severe symptoms (such as CNS or pulmonary). Or persistent severe range BPs despite maximum doses of 2 oral agents. Otherwise, will attempt to maintain pregnancy until 32 weeks.    Rubie Maid, MD [facility] at [facility]

## 2022-03-31 NOTE — Progress Notes (Signed)
Antenatal Progress Note  Subjective:     Patient ID: Patricia Mcbride is a 42 y.o. G3P0020 female [redacted]w[redacted]d, Estimated Date of Delivery: 05/29/22 dated by 1st trimester sono ([redacted]w[redacted]d) who was admitted for pre-eclampsia with severe features, Type II DM.  Is s/p full course of betamethasone. Also with a h/o anxiety/depression, currently on Celexa.H/o HSV.  HD# 3.   Subjective:  Patient reports feeling more tired today.  Feels wiped out.     Review of Systems Denies contractions, leakage of fluids, vaginal bleeding, and reports good fetal movement.     Objective:   Vitals:   03/31/22 0108 03/31/22 0138 03/31/22 0308 03/31/22 0339  BP: (!) 152/73 (!) 154/69 (!) 150/72 (!) 159/78  Pulse: 84  85   Temp:      Resp:      Height:      Weight:      SpO2:      TempSrc:      BMI (Calculated):        General appearance: alert and no distress Lungs: clear to auscultation bilaterally Heart: regular rate and rhythm, S1, S2 normal, no murmur, click, rub or gallop Abdomen: soft, non-tender; bowel sounds normal; no masses,  no organomegaly, gravid.  Pelvic: deferred Extremities: edema +2 pitting bilaterally.  No redness or tenderness in the calves or thighs Neuro: reflexes intact, no clonus.    Fetal Tracing Interpretation for 03/31/22  Indication: Diabetes, Advanced Maternal Age >40 years, and Pre-eclampsia  Fetal Heart Rate A Mode: External Baseline Rate (A): 140 bpm Variability: Moderate Accelerations: 15 x 15 Decelerations: None  Uterine Activity Mode: Toco Contraction Frequency (min): none Contraction Duration (sec): 80 Contraction Quality: Mild Resting Tone Palpated: Relaxed Resting Time: Adequate   Labs:     Latest Ref Rng & Units 03/30/2022    5:40 AM 03/28/2022   10:24 AM 02/28/2022   11:31 AM  CBC  WBC 4.0 - 10.5 K/uL 15.1  12.1  13.9   Hemoglobin 12.0 - 15.0 g/dL 96.0  45.4  09.8   Hematocrit 36.0 - 46.0 % 32.8  31.7  33.2   Platelets 150 - 400 K/uL 353  328  336        Latest Ref Rng & Units 03/30/2022    5:40 AM 03/29/2022    6:01 AM 03/28/2022   10:24 AM  CMP  Glucose 70 - 99 mg/dL 119  147  829   BUN 6 - 20 mg/dL 8   9   Creatinine 5.62 - 1.00 mg/dL 1.30   8.65   Sodium 784 - 145 mmol/L 135   136   Potassium 3.5 - 5.1 mmol/L 4.2   4.0   Chloride 98 - 111 mmol/L 108   108   CO2 22 - 32 mmol/L 18   22   Calcium 8.9 - 10.3 mg/dL 8.0   8.6   Total Protein 6.5 - 8.1 g/dL 5.9   5.7   Total Bilirubin 0.3 - 1.2 mg/dL 0.7   0.4   Alkaline Phos 38 - 126 U/L 83   87   AST 15 - 41 U/L 11   14   ALT 0 - 44 U/L 11   11      Latest Reference Range & Units 02/28/22 11:19 03/28/22 10:08  Protein Creatinine Ratio 0.00 - 0.15 mg/mgCre 0.09 0.31 (H)  (H): Data is abnormally high  Lab Results  Component Value Date   PROTEIN24HR 1,088 (H) 03/29/2022    Imaging:  [place]  MFM OB FOLLOW UP ----------------------------------------------------------------------  OBSTETRICS REPORT                       (Signed Final 02/28/2022 10:55 am) ---------------------------------------------------------------------- Patient Info  ID #:       [id number]                          D.O.B.:  03/13/1980 (41 yrs)  Name:       Patricia Mcbride            Visit Date: 02/28/2022 09:37 am ---------------------------------------------------------------------- Performed By  Attending:        Lin Landsman      Ref. Address:     Salomon Mast                    MD                                                             [address]                                                             [place]                                                             [id number]  Performed By:     Eden Lathe BS      [facility]                    RD[facility] RVT                                 [facility] at                                                             [facility]  Referred By:       Courtney Heys                    [facility] ---------------------------------------------------------------------- Orders  #  Description  Code        Ordered By  1  [place] MFM OB FOLLOW UP                   [id number]    Lin Landsman ----------------------------------------------------------------------  #  Order #                     Accession #                Episode #  1  [id number]                   [id number]                 [id number] ---------------------------------------------------------------------- Indications  Pre-existing diabetes, type 2, in pregnancy,   O24.112  second trimester (insulin,metformin)  Advanced maternal age multigravida 50+,        O56.522  second trimester  Obesity complicating pregnancy, second         O87.212  trimester  NIPS-LR female  [redacted] weeks gestation of pregnancy                Z3A.27  Antenatal follow-up for nonvisualized fetal    Z36.2  anatomy ---------------------------------------------------------------------- Vital Signs                                                 Height:        5'2" ---------------------------------------------------------------------- Fetal Evaluation  Num Of Fetuses:         1  Fetal Heart Rate(bpm):  162  Cardiac Activity:       Observed  Presentation:           Cephalic  Placenta:               Posterior  P. Cord Insertion:      Marginal insertion  Amniotic Fluid  AFI FV:      Within normal limits  AFI Sum(cm)     %Tile       Largest Pocket(cm)  21.22           87          6.16  RUQ(cm)       RLQ(cm)       LUQ(cm)        LLQ(cm)  6.03          5.47          3.56           6.16 ---------------------------------------------------------------------- Biometry  BPD:     70.13  mm     G. Age:  28w 1d         73  %    CI:        77.44   %    70 - 86  FL/HC:      19.3   %    18.6 -  20.4  HC:    252.29   mm     G. Age:  27w 3d         29  %    HC/AC:      1.11        1.05 - 1.21  AC:    227.07   mm     G. Age:  27w 1d         40  %    FL/BPD:     69.4   %    71 - 87  FL:       48.7  mm     G. Age:  26w 3d         15  %    FL/AC:      21.4   %    20 - 24  LV:        4.7  mm  Est. FW:    1003  gm      2 lb 3 oz     30  % ---------------------------------------------------------------------- OB History  Gravidity:    3          SAB:   2  Living:       0 ---------------------------------------------------------------------- Gestational Age  LMP:           28w 2d        Date:  08/14/21                  EDD:   05/21/22  U/S Today:     27w 2d                                        EDD:   05/28/22  Best:          27w 1d     Det. By:  Marcella Dubs         EDD:   05/29/22                                      (11/19/21) ---------------------------------------------------------------------- Anatomy  Cranium:               Appears normal         Aortic Arch:            Previously seen  Cavum:                 Appears normal         Ductal Arch:            Appears normal  Ventricles:            Appears normal         Diaphragm:              Appears normal  Choroid Plexus:        Previously seen        Stomach:                Appears normal, left  sided  Cerebellum:            Previously seen        Abdomen:                Appears normal  Posterior Fossa:       Previously seen        Abdominal Wall:         Previously seen  Nuchal Fold:           Previously seen        Cord Vessels:           Previously seen  Face:                  Appears normal         Kidneys:                Appear normal                         (orbits and profile)  Lips:                  Previously seen        Bladder:                Appears normal  Thoracic:              Appears normal         Spine:                  Previously seen  Heart:                  Appears normal         Upper Extremities:      Previously seen                         (4CH, axis, and                         situs)  RVOT:                  Appears normal         Lower Extremities:      Previously seen  LVOT:                  Appears normal  Other:  Heels/feet and open hands/5th digits prev visualized. VC, nasal bone,          lenses, maxilla, mandible and falx visualized VC, 3VV and 3VTV          visualized. Technically difficult due to maternal habitus. ---------------------------------------------------------------------- Cervix Uterus Adnexa  Cervix  Not visualized (advanced GA >24wks)  Uterus  No abnormality visualized.  Right Ovary  Not visualized.  Left Ovary  Not visualized.  Cul De Sac  No free fluid seen.  Adnexa  No abnormality visualized ---------------------------------------------------------------------- Impression  Follow up growth to complete the fetaal anatomy and also  given T2DM, BMI and AMA  Normal interval growth with measurements consistent with  dates  Good fetal movement and amniotic fluid volume  Suboptimal views of the fetal anatomy in was again seen  secondary to fetal position and maternal habitus.  We observed a marginal cord insertion. We will continue  serial growth as consistent with T2DM.  She is scheduled for a fetal echocardiogram to complete the  fetal  heart.  Today we observed her blood pressure elevated: 139/90,  144/83 and 152/84 mmHg. She is asymptomatic.  I discussed today's visit she denies s/sx of preeclampisa,  Nevertheless given her risk factors we have sent her to L&D  for [facility] labs.  She will continue serial growth in 4-5 weeks. We will continue  weekly testing at 32 weeks.  I discussed this plan of care with Dr. Valentino Saxon who will follow up  on her labs and she will consider making an earlier  appointment in the week instead of the 2/15 to assess her  blood  pressure. ---------------------------------------------------------------------- Recommendations  Follow up growth in 4-5 weeks.  Initiate weekly testing at 32 weeks. ----------------------------------------------------------------------              Lin Landsman, MD Electronically Signed Final Report   02/28/2022 10:55 am ----------------------------------------------------------------------    Assessment:  42 y.o.  G3P0020 female [redacted]w[redacted]d, with:   Pre-eclampsia with severe features in third trimester (previously gestational HTN) Type II DM in pregnancy Advanced maternal age in third trimester Anxiety and depression Obesity in pregnancy (BMI 52) History of HSV   Plan:   Pre-eclampsia with severe features in third trimester (previously gestational HTN) - Currently on Procardia 60 mg BID but changed this morning to 120 mg daily. Also will add Labetalol 200 mg today at recommendations of MFM. Has not had any treatable severe range pressures since yesterday.  - S/p therapy with Magnesium sulfate. - Is now s/p full course of antenatal steroids.  - Discussed with MFM yesterday (Dr. Bryn Gulling) regarding patient delivery plans.  Would consider delivery only if patient continues to be symptomatic (pulmonary edema, unresolved headache), blood pressures uncontrolled on 2 agents, worsening lab abnormalities (progression to HELLP). Otherwise plan for delivery at 34 weeks. Advised patient that goal was at least to get to 32 weeks and will reassess.   - Continuous fetal monitoring - Ted Hose - 24 hr urine collection noting worsening proteinuria.  - Lovenox daily.  - MFM consult placed for tomorrow. Also will schedule growth scan for tomorrow.  - Neonatology consult completed.  - Will transfer to antepartum floor as she is no longer requiring magnesium and has not had any treatable BPs. Will decrease to NSTs q shift.   Type II DM in pregnancy - Currently on home insulin regimen (24 units  lantus AM, 34 units PM).  Need to restart Metformin.  Increased evening dose to 38 units added coverage as slightly uncontrolled likely due to receiving steroids.   Advanced maternal age in third trimester - Patient has had genetic screening in current pregnancy, normal.   Anxiety and depression - Continue Celexa daily.   Obesity in pregnancy (BMI 52) - For repeat Anesthesia consult at 34 weeks unless imminent delivery  6.  H/o HSV - Currently on no HSV prophylaxis.  However based on fetal presentation or severity of pre-eclampsia at time of indicated delivery, may require C-section. Can start on prophylaxis tomorrow if fetus noted to be cephalic.     A total of 50 minutes were spent during this encounter, including review of previous progress notes, recent imaging and labs, face-to-face with time with patient involving counseling and coordination of care, as well as documentation for current visit.    Hildred Laser, MD [facility] at [facility]

## 2022-04-01 DIAGNOSIS — O09523 Supervision of elderly multigravida, third trimester: Secondary | ICD-10-CM | POA: Diagnosis not present

## 2022-04-01 DIAGNOSIS — O1413 Severe pre-eclampsia, third trimester: Secondary | ICD-10-CM

## 2022-04-01 DIAGNOSIS — Z3A31 31 weeks gestation of pregnancy: Secondary | ICD-10-CM

## 2022-04-01 DIAGNOSIS — E119 Type 2 diabetes mellitus without complications: Secondary | ICD-10-CM | POA: Diagnosis not present

## 2022-04-01 DIAGNOSIS — O24113 Pre-existing diabetes mellitus, type 2, in pregnancy, third trimester: Secondary | ICD-10-CM | POA: Diagnosis not present

## 2022-04-01 DIAGNOSIS — O99343 Other mental disorders complicating pregnancy, third trimester: Secondary | ICD-10-CM | POA: Diagnosis not present

## 2022-04-01 LAB — GLUCOSE, CAPILLARY
Glucose-Capillary: 99 mg/dL (ref 70–99)
Glucose-Capillary: 119 mg/dL — ABNORMAL HIGH (ref 70–99)
Glucose-Capillary: 130 mg/dL — ABNORMAL HIGH (ref 70–99)

## 2022-04-01 LAB — CBC
HCT: 34.9 % — ABNORMAL LOW (ref 36.0–46.0)
Hemoglobin: 11.3 g/dL — ABNORMAL LOW (ref 12.0–15.0)
MCH: 28.5 pg (ref 26.0–34.0)
MCHC: 32.4 g/dL (ref 30.0–36.0)
MCV: 87.9 fL (ref 80.0–100.0)
Platelets: 366 10*3/uL (ref 150–400)
RBC: 3.97 MIL/uL (ref 3.87–5.11)
RDW: 13.4 % (ref 11.5–15.5)
WBC: 15.1 10*3/uL — ABNORMAL HIGH (ref 4.0–10.5)
nRBC: 0 % (ref 0.0–0.2)

## 2022-04-01 LAB — COMPREHENSIVE METABOLIC PANEL
ALT: 11 U/L (ref 0–44)
AST: 13 U/L — ABNORMAL LOW (ref 15–41)
Albumin: 2.5 g/dL — ABNORMAL LOW (ref 3.5–5.0)
Alkaline Phosphatase: 98 U/L (ref 38–126)
Anion gap: 11 (ref 5–15)
BUN: 9 mg/dL (ref 6–20)
CO2: 17 mmol/L — ABNORMAL LOW (ref 22–32)
Calcium: 8.1 mg/dL — ABNORMAL LOW (ref 8.9–10.3)
Chloride: 106 mmol/L (ref 98–111)
Creatinine, Ser: 0.68 mg/dL (ref 0.44–1.00)
GFR, Estimated: >60 mL/min (ref >60)
Glucose, Bld: 139 mg/dL — ABNORMAL HIGH (ref 70–99)
Potassium: 3.9 mmol/L (ref 3.5–5.1)
Sodium: 134 mmol/L — ABNORMAL LOW (ref 135–145)
Total Bilirubin: 0.7 mg/dL (ref 0.3–1.2)
Total Protein: 6.2 g/dL — ABNORMAL LOW (ref 6.5–8.1)

## 2022-04-01 LAB — MAGNESIUM: Magnesium: 4.4 mg/dL — ABNORMAL HIGH (ref 1.7–2.4)

## 2022-04-01 MED ORDER — LABETALOL HCL 100 MG PO TABS
400.0000 mg | ORAL_TABLET | Freq: Three times a day (TID) | ORAL | Status: DC
  Administered 2022-04-01: 400 mg via ORAL
  Filled 2022-04-01: qty 4

## 2022-04-01 MED ORDER — LABETALOL HCL 100 MG PO TABS
600.0000 mg | ORAL_TABLET | Freq: Three times a day (TID) | ORAL | Status: DC
  Administered 2022-04-01 – 2022-04-02 (×3): 600 mg via ORAL
  Filled 2022-04-01 (×4): qty 6

## 2022-04-01 MED ORDER — VALACYCLOVIR HCL 500 MG PO TABS
500.0000 mg | ORAL_TABLET | Freq: Two times a day (BID) | ORAL | Status: DC
  Administered 2022-04-01 (×2): 500 mg via ORAL
  Filled 2022-04-01 (×3): qty 1

## 2022-04-01 NOTE — Progress Notes (Signed)
Patient sitting up eating breakfast and ambulating to bathroom.

## 2022-04-01 NOTE — Progress Notes (Signed)
Postprandial CBG of 130 at 20:28. Pt declined bedtime CBG d/t close proximity to prior assessment.

## 2022-04-01 NOTE — Progress Notes (Signed)
[id number] 3 RNs at bedside with difficulty maintaining continuous monitoring d/t vigorous fetal movement. FHT remained in 140s throughout.

## 2022-04-01 NOTE — Progress Notes (Signed)
Antenatal Progress Note  Subjective:     Patient ID: Patricia Mcbride is a 42 y.o. G3P0020 female [redacted]w[redacted]d Estimated Date of Delivery: 05/29/22 dated by 1st trimester sono (186w5dwho was admitted for pre-eclampsia with severe features, Type II DM.  Is s/p full course of betamethasone. Also with a h/o anxiety/depression, currently on Celexa.H/o HSV.  HD# 4.   Subjective:  Patient reports feeling tired today.  Notes having a rough night overnight. Was experiencing some shortness of breath after restarting magnesium sulfate. Also felt like it was causing her anxiousness. Was having some abdominal pain last night, states she was told by the nurse it was contractions.   Review of Systems Denies contractions, leakage of fluids, vaginal bleeding, and reports good fetal movement.     Objective:   Vitals:   04/01/22 1050 04/01/22 1110 04/01/22 1125 04/01/22 1140  BP:  (!) 154/79 (!) 161/77 (!) 154/76  Pulse:  84 84 84  Resp:      Temp:      TempSrc:      SpO2: 95% 95% 95% 91%  Weight:      Height:        General appearance: alert and no distress Lungs: clear to auscultation bilaterally Heart: regular rate and rhythm, S1, S2 normal, no murmur, click, rub or gallop Abdomen: soft, non-tender; bowel sounds normal; no masses,  no organomegaly, gravid.  Pelvic: deferred Extremities: edema +2 pitting bilaterally.  Non-pitting edema in hands and face. No redness or tenderness in the calves or thighs Neuro: reflexes intact, no clonus.    Fetal Tracing Interpretation for 04/01/22  Indication: Diabetes, Advanced Maternal Age >40 years, and Pre-eclampsia  Fetal Heart Rate A Mode: External Baseline Rate (A): 140 bpm Variability: Moderate Accelerations: 15 x 15 Decelerations: Variable Multiple birth?: No  Uterine Activity Mode: Toco Contraction Frequency (min): 3-6 Contraction Duration (sec): 50-70 Contraction Quality: Mild, Moderate Resting Tone Palpated: Relaxed Resting Time:  Adequate   Labs:     Latest Ref Rng & Units 03/30/2022    5:40 AM 03/28/2022   10:24 AM 02/28/2022   11:31 AM  CBC  WBC 4.0 - 10.5 K/uL 15.1  12.1  13.9   Hemoglobin 12.0 - 15.0 g/dL 10.7  10.6  11.0   Hematocrit 36.0 - 46.0 % 32.8  31.7  33.2   Platelets 150 - 400 K/uL 353  328  336       Latest Ref Rng & Units 03/30/2022    5:40 AM 03/29/2022    6:01 AM 03/28/2022   10:24 AM  CMP  Glucose 70 - 99 mg/dL 150  159  138   BUN 6 - 20 mg/dL 8   9   Creatinine 0.44 - 1.00 mg/dL 0.56   0.67   Sodium 135 - 145 mmol/L 135   136   Potassium 3.5 - 5.1 mmol/L 4.2   4.0   Chloride 98 - 111 mmol/L 108   108   CO2 22 - 32 mmol/L 18   22   Calcium 8.9 - 10.3 mg/dL 8.0   8.6   Total Protein 6.5 - 8.1 g/dL 5.9   5.7   Total Bilirubin 0.3 - 1.2 mg/dL 0.7   0.4   Alkaline Phos 38 - 126 U/L 83   87   AST 15 - 41 U/L 11   14   ALT 0 - 44 U/L 11   11      Latest Reference Range & Units 02/28/22 11:19 03/28/22  10:08  Protein Creatinine Ratio 0.00 - 0.15 mg/mgCre 0.09 0.31 (H)  (H): Data is abnormally high  Lab Results  Component Value Date   PROTEIN24HR 1,088 (H) 03/29/2022    Imaging:  US OB Comp + 14 Wk CLINICAL DATA:  [place]:[id number], [id number], [id number]. Hypertension in pregnancy, preeclampsia, severe. Type 2 diabetes.  EXAM: OBSTETRICAL ULTRASOUND >14 WKS  COMPARE: Detailed anatomy scan at 21 weeks 5 days dated 01/24/2022, and the first trimester OB ultrasound at 12 weeks and 5 days dated 11/19/2021.  FINDINGS: Number of Fetuses: 1  Heart Rate:  152 bpm  Movement: Yes.  Presentation: Cephalic.  Previa: No previa.  Placental Location: Posterior.  Amniotic Fluid (Subjective): Normal.  Amniotic Fluid (Objective):  Vertical pocket = 5.9cm  AFI = 17.0 cm (5%ile= 8.6 cm, 95%= 24.2 cm for 32 wks)  FETAL BIOMETRY  BPD: 8.19cm 32w 6d, [place] 79%  HC:   29.86cm 33w 1d, hadlock 54.3%  AC:   28.98cm 33w 0d, hadlock 85.2%  FL:   6.24cm 32w 2d, hadlock 57.7%  Current Mean GA: 32w 6d  [place] EDC: 05/20/2022  Assigned GA:  32w 6d Assigned EDC: 05/29/2022  Estimated Fetal Weight:  2051+/- 304g 78.3%ile  FETAL ANATOMY  Lateral Ventricles: Appears normal  Thalami/CSP: Not visualized  Posterior Fossa:  Previously seen and appeared normal.  Upper Lip: Previously seen  Spine: Appears normal  4 Chamber Heart on Left: Appears normal  LVOT: Not seen currently or previously.  RVOT: Not seen currently or previously.  Stomach on Left: Appears normal  3 Vessel Cord: Appears normal  Cord Insertion site: Appears normal  Kidneys: Appears normal  Bladder: Appears normal  Extremities: Appears normal  Sex: Again appears to be female.  Technically difficult due to: Maternal body habitus and fetal age and lie.  Maternal Findings:  Cervix:  Closed without funneling.  Measures 3.7 cm length.  IMPRESSION: 1. Living single IUP in cephalic presentation, estimated age 37 weeks 6 days. 2. The current measurements are 9 days large for the original ultrasound dates. 3. Previously unseen anatomy is still unseen. 4. Anatomy scan otherwise without significant findings. 5. No oligohydramnios, cervical shortening or funneling. 6. Posterior placenta without previa or fluid collection.  Electronically Signed   By: Telford Nab M.D.   On: 03/31/2022 23:01 DG Chest 1 View CLINICAL DATA:  [id number]. Shortness of breath. Patient shielded due to pregnancy.  EXAM: CHEST  1 VIEW  COMPARISON:  None Available.  FINDINGS: The heart is borderline prominent. There is slight fullness of the central vasculature and evidence of minimal interstitial edema in the base of the lungs with scattered Kerley's B lines.  There is no substantial pleural effusion. The lungs are clear of infiltrates. The mediastinum is normally outlined.  There is a chronic comminuted fracture of the distal right clavicle with several well corticated ununited bone fragments.  IMPRESSION: Borderline  prominent heart with slight fullness of the central vasculature and minimal interstitial edema in the base of the lungs. No substantial pleural effusion. No focal infiltrates.  Electronically Signed   By: Telford Nab M.D.   On: 03/31/2022 22:37    Assessment:  42 y.o.  G3P0020 female [redacted]w[redacted]d with:   Pre-eclampsia with severe features in third trimester (previously gestational HTN) Type II DM in pregnancy Advanced maternal age in third trimester Anxiety and depression Obesity in pregnancy (BMI 52) History of HSV   Plan:   Pre-eclampsia with severe features in third trimester (previously gestational HTN) - Currently on Procardia  129 mg daily Also on Labetalol 300 mg TID.  Occasional treatable range BPs noted. Will increase to 400 mg TID.  Continue to follow recommendations of MFM -  Would consider delivery only if patient continues to be symptomatic (pulmonary edema, unresolved headache), blood pressures uncontrolled on max dose of 2 agents, worsening lab abnormalities (progression to HELLP). Otherwise plan for delivery at 32 weeks. Advised patient that goal was at least to get to 32 weeks and will reassess.  .  - Now again on therapy with Magnesium sulfate. - Is s/p full course of antenatal steroids.  - Ted Hose - Lovenox daily.  - MFM consult placed today.  - Growth scan noting good fetal growth.  - Neonatology consult completed.  - Will transfer to antepartum floor as she is no longer requiring magnesium and has not had any treatable BPs. Will decrease to NSTs q shift.   Type II DM in pregnancy - Currently on home insulin regimen (24 units lantus AM, now 38 units PM (was 34)).  Reumed Metformin.   Advanced maternal age in third trimester - Patient has had genetic screening in current pregnancy, normal.   Anxiety and depression - Continue Celexa daily.   Obesity in pregnancy (BMI 52) - S/p Anesthesia consult yesterday due to BMI. Airway clearance granted.   6.  H/o  HSV - Currently on no HSV prophylaxis. Starting prophylaxis today as fetus is cephalic, in case of spontaneous or induced labor.    A total of 40 minutes were spent during this encounter, including review of previous progress notes, recent imaging and labs, face-to-face with time with patient involving counseling and coordination of care, as well as documentation for current visit.    Rubie Maid, MD [facility] at [facility]

## 2022-04-01 NOTE — Progress Notes (Signed)
After administering labetalol for 2 severe range blood pressures, Patricia Mcbride stated she needed to immediately use the bathroom. RN educated her on the importance of reevaluating BP 10 minutes after IV medication administration. Pt stated understanding but could not wait to use the bathroom.

## 2022-04-01 NOTE — Consult Note (Addendum)
Maternal-Fetal Medicine   Name: Patricia Mcbride DOB: 1980-10-06 MRN: [id number] Referring Provider: Hildred Laser, MD  I had the pleasure of seeing Patricia Mcbride today and she is admitted at [facility]6. Her mother was present during my consultation.  She is G3 P0020 at 31w 5d gestation and was admitted on 03/28/22 with increased blood pressure (175/90 mm Hg).   Subsequently, preeclampsia with severe features was diagnosed.  Patient does not have symptoms of severe headache or visual disturbances or right upper quadrant pain or vaginal bleeding. She is currently on labetalol 400 mg 3 times daily and nifedipine XL 120 mg XL daily.  She has been getting intermittent acute hypertension treatment with IV labetalol.  Most recently, she received IV labetalol today at 10:12 AM. Magnesium sulfate was restarted yesterday evening for seizure prophylaxis.  Patient received a course of antenatal corticosteroids.  Patient is on Lovenox prophylaxis and received her last dose today at 12:38 PM. Labs including liver enzymes, creatinine and platelets were within normal range.  Protein/creatinine ratio was increased, and total urine protein (24 hours) was increased at 1,088 grams.  Past medical history significant for type 2 diabetes diagnosed about a year ago.  Patient is on glargine insulin 28 units and NovoLog with meals.  Diabetes is better controlled. Past surgical history: Endoscopy. Allergies: Morphine (nausea).  Prenatal course: On cell-free fetal DNA screen, the risks of fetal aneuploidies are not increased.  Fetal anatomical survey was normal and she had normal fetal echocardiography. Ultrasound performed yesterday showed an appropriately grown fetus with the estimated fetal weight at the 78th percentile.  P/E: Patient is comfortably lying bed; not in distress. Vitals: Blood pressure (24 hours) ranges 148-182/76-93 mmHg.  Pulse 87/minute, RR 20/minute.  O2 sat 95%. HEENT: Normal Abdomen: Soft, gravid uterus;  no tenderness.  No flank tenderness. Pedal edema present. NST is reactive for this gestational age.  Preeclampsia with severe features -I explained the diagnosis of preeclampsia and the criteria for labelling it as severe (SBP greater than 160 mm Hg).  I discussed the possible complications of preeclampsia including maternal stroke, eclampsia, endorgan damage, renal failure, coagulation failure and placental abruption. -I discussed antihypertensive treatment and the safety profiles.  Discussed the role of acute hypertension treatment with IV antihypertensives.  -I counseled her on magnesium sulfate seizure prophylaxis.  Magnesium sulfate also reduces the likelihood of moderate to severe cerebral palsy in the newborns.  -Patient is aware of the benefit of antenatal corticosteroids for fetal lung maturity.  -We discussed indications for delivery.  In patients with preeclampsia with severe features, we would recommend delivery at [redacted] weeks gestation provided no maternal or fetal complications are present. -Patient is on maximum doses of nifedipine and is taking labetalol thousand 200 mg daily.  She required IV antihypertensive treatment intermittently for severe systolic blood pressure readings.  I recommend increasing labetalol to 600 mg tid. If blood pressures are not well controlled after increasing the dosage and if she requires frequent IV antihypertensive treatment, I recommend delivery.  It is preferable to delay delivery for 24 hours for the patient to have an opportunity to get epidural analgesia. Delivery should not be delayed if severe hypertension recurs despite antihypertensive treatment.  Patient agreed with my recommendations.   Recommendations-Discontinue lovenox and substitute with unfractionated heparin 10,000 units twice daily from tomorrow if the patient is still undelivered.  -Increase labetalol to 600 mg tid now. -Consider delivery if no response to increased labetalol  dosage and if the patient requires IV frequent antihypertensive  treatment despite increase in labetalol. -Magnesium sulfate infusion to continue.  Delivery is indicated (not limited to) for following reasons:  -Persistent and uncontrolled hypertension not responding to antihypertensives  -Abnormal labs including increasing liver enzymes or thrombocytopenia (<100,000).  -Non-reassuring fetal heart trace.  -Oligohydramnios or abnormal antenatal testings.  -Oliguria (hourly output 20 mL to 30 mL).  -Increasing creatinine (?1.1) Severe symptoms of headache or visual spots or persistent right upper quadrant pain.  -Placental abruption. -HSV prophylaxis (valacyclovir) during labor.  Discussed with Dr. Valentino Saxon.  Thank you for consultation.  If you have any questions or concerns, please contact me the [facility].  Consultation including face-to-face counseling (more than 50% of time spent) is 45 minutes.

## 2022-04-01 NOTE — Progress Notes (Signed)
Per Dr. Marcelline Mates, patient okay to shower.

## 2022-04-02 ENCOUNTER — Encounter: Payer: Self-pay | Admitting: Obstetrics and Gynecology

## 2022-04-02 ENCOUNTER — Inpatient Hospital Stay: Payer: Managed Care, Other (non HMO) | Admitting: Registered Nurse

## 2022-04-02 ENCOUNTER — Other Ambulatory Visit: Payer: Self-pay

## 2022-04-02 ENCOUNTER — Encounter
Admission: EM | Disposition: A | Discharge status: Home / Self Care | Payer: Self-pay | Source: Home / Self Care | Attending: Certified Nurse Midwife

## 2022-04-02 DIAGNOSIS — O99344 Other mental disorders complicating childbirth: Secondary | ICD-10-CM

## 2022-04-02 DIAGNOSIS — O09293 Supervision of pregnancy with other poor reproductive or obstetric history, third trimester: Secondary | ICD-10-CM

## 2022-04-02 DIAGNOSIS — O1414 Severe pre-eclampsia complicating childbirth: Secondary | ICD-10-CM

## 2022-04-02 LAB — GLUCOSE, CAPILLARY
Glucose-Capillary: 75 mg/dL (ref 70–99)
Glucose-Capillary: 92 mg/dL (ref 70–99)
Glucose-Capillary: 65 mg/dL — ABNORMAL LOW (ref 70–99)

## 2022-04-02 LAB — CBC
HCT: 32.5 % — ABNORMAL LOW (ref 36.0–46.0)
Hemoglobin: 10.7 g/dL — ABNORMAL LOW (ref 12.0–15.0)
MCH: 28.6 pg (ref 26.0–34.0)
MCHC: 32.9 g/dL (ref 30.0–36.0)
MCV: 86.9 fL (ref 80.0–100.0)
Platelets: 335 10*3/uL (ref 150–400)
RBC: 3.74 MIL/uL — ABNORMAL LOW (ref 3.87–5.11)
RDW: 13.4 % (ref 11.5–15.5)
WBC: 13 10*3/uL — ABNORMAL HIGH (ref 4.0–10.5)
nRBC: 0 % (ref 0.0–0.2)

## 2022-04-02 LAB — RPR: RPR Ser Ql: NONREACTIVE

## 2022-04-02 LAB — ABO/RH: ABO/RH(D): B POS

## 2022-04-02 LAB — MAGNESIUM: Magnesium: 4.9 mg/dL — ABNORMAL HIGH (ref 1.7–2.4)

## 2022-04-02 SURGERY — Surgical Case
Anesthesia: Spinal

## 2022-04-02 MED ORDER — FENTANYL CITRATE (PF) 100 MCG/2ML IJ SOLN
INTRAMUSCULAR | Status: AC
  Filled 2022-04-02: qty 2

## 2022-04-02 MED ORDER — KETOROLAC TROMETHAMINE 30 MG/ML IJ SOLN: 30.0000 mg | Freq: Four times a day (QID) | INTRAMUSCULAR | Status: AC | PRN

## 2022-04-02 MED ORDER — MENTHOL 3 MG MT LOZG: 1.0000 | LOZENGE | OROMUCOSAL | Status: DC | PRN

## 2022-04-02 MED ORDER — BUPIVACAINE IN DEXTROSE 0.75-8.25 % IT SOLN
INTRATHECAL | Status: DC | PRN
  Administered 2022-04-02: 1.5 mL via INTRATHECAL

## 2022-04-02 MED ORDER — DIPHENHYDRAMINE HCL 25 MG PO CAPS: 25.0000 mg | ORAL_CAPSULE | Freq: Four times a day (QID) | ORAL | Status: DC | PRN

## 2022-04-02 MED ORDER — LIDOCAINE 5 % EX PTCH
MEDICATED_PATCH | CUTANEOUS | Status: AC
  Filled 2022-04-02: qty 1

## 2022-04-02 MED ORDER — LACTATED RINGERS IV SOLN: INTRAVENOUS | Status: DC

## 2022-04-02 MED ORDER — PHENYLEPHRINE HCL-NACL 20-0.9 MG/250ML-% IV SOLN
INTRAVENOUS | Status: AC
  Filled 2022-04-02: qty 250

## 2022-04-02 MED ORDER — CARBOPROST TROMETHAMINE 250 MCG/ML IM SOLN
INTRAMUSCULAR | Status: AC
  Filled 2022-04-02: qty 1

## 2022-04-02 MED ORDER — KETOROLAC TROMETHAMINE 30 MG/ML IJ SOLN
30.0000 mg | Freq: Four times a day (QID) | INTRAMUSCULAR | Status: AC | PRN
  Administered 2022-04-02 – 2022-04-03 (×4): 30 mg via INTRAVENOUS
  Filled 2022-04-02 (×4): qty 1

## 2022-04-02 MED ORDER — DIPHENHYDRAMINE HCL 50 MG/ML IJ SOLN: 12.5000 mg | INTRAMUSCULAR | Status: DC | PRN

## 2022-04-02 MED ORDER — SCOPOLAMINE 1 MG/3DAYS TD PT72: 1.0000 | MEDICATED_PATCH | Freq: Once | TRANSDERMAL | Status: DC

## 2022-04-02 MED ORDER — DIPHENHYDRAMINE HCL 25 MG PO CAPS: 25.0000 mg | ORAL_CAPSULE | ORAL | Status: DC | PRN

## 2022-04-02 MED ORDER — NALOXONE HCL 4 MG/10ML IJ SOLN: 1.0000 ug/kg/h | INTRAVENOUS | Status: DC | PRN

## 2022-04-02 MED ORDER — ZOLPIDEM TARTRATE 5 MG PO TABS: 5.0000 mg | ORAL_TABLET | Freq: Every evening | ORAL | Status: DC | PRN

## 2022-04-02 MED ORDER — ONDANSETRON HCL 4 MG/2ML IJ SOLN
INTRAMUSCULAR | Status: AC
  Filled 2022-04-02: qty 2

## 2022-04-02 MED ORDER — TRANEXAMIC ACID-NACL 1000-0.7 MG/100ML-% IV SOLN
INTRAVENOUS | Status: AC
  Filled 2022-04-02: qty 100

## 2022-04-02 MED ORDER — LIDOCAINE 5 % EX PTCH
MEDICATED_PATCH | CUTANEOUS | Status: DC | PRN
  Administered 2022-04-02: 1 via TRANSDERMAL

## 2022-04-02 MED ORDER — MORPHINE SULFATE (PF) 0.5 MG/ML IJ SOLN
INTRAMUSCULAR | Status: AC
  Filled 2022-04-02: qty 10

## 2022-04-02 MED ORDER — FENTANYL CITRATE (PF) 100 MCG/2ML IJ SOLN
INTRAMUSCULAR | Status: DC | PRN
  Administered 2022-04-02: 15 ug via INTRATHECAL

## 2022-04-02 MED ORDER — HEPARIN SODIUM (PORCINE) 5000 UNIT/ML IJ SOLN
5000.0000 [IU] | Freq: Three times a day (TID) | INTRAMUSCULAR | Status: DC
  Administered 2022-04-02 – 2022-04-06 (×12): 5000 [IU] via SUBCUTANEOUS
  Filled 2022-04-02 (×12): qty 1

## 2022-04-02 MED ORDER — POVIDONE-IODINE 10 % EX SWAB
2.0000 | Freq: Once | CUTANEOUS | Status: AC
  Administered 2022-04-02: 2 via TOPICAL

## 2022-04-02 MED ORDER — NIFEDIPINE ER OSMOTIC RELEASE 30 MG PO TB24
60.0000 mg | ORAL_TABLET | Freq: Two times a day (BID) | ORAL | Status: DC
  Administered 2022-04-02 – 2022-04-06 (×8): 60 mg via ORAL
  Filled 2022-04-02 (×9): qty 2

## 2022-04-02 MED ORDER — SIMETHICONE 80 MG PO CHEW
80.0000 mg | CHEWABLE_TABLET | Freq: Four times a day (QID) | ORAL | Status: DC
  Administered 2022-04-02 – 2022-04-06 (×16): 80 mg via ORAL
  Filled 2022-04-02 (×16): qty 1

## 2022-04-02 MED ORDER — PRENATAL MULTIVITAMIN CH
1.0000 | ORAL_TABLET | Freq: Every day | ORAL | Status: DC
  Administered 2022-04-03 – 2022-04-06 (×4): 1 via ORAL
  Filled 2022-04-02 (×5): qty 1

## 2022-04-02 MED ORDER — PHENYLEPHRINE HCL-NACL 20-0.9 MG/250ML-% IV SOLN
INTRAVENOUS | Status: DC | PRN
  Administered 2022-04-02: 50 ug/min via INTRAVENOUS

## 2022-04-02 MED ORDER — OXYCODONE-ACETAMINOPHEN 5-325 MG PO TABS
1.0000 | ORAL_TABLET | ORAL | Status: DC | PRN
  Filled 2022-04-02: qty 2

## 2022-04-02 MED ORDER — SOD CITRATE-CITRIC ACID 500-334 MG/5ML PO SOLN
ORAL | Status: AC
  Administered 2022-04-02: 30 mL
  Filled 2022-04-02: qty 15

## 2022-04-02 MED ORDER — NALOXONE HCL 0.4 MG/ML IJ SOLN: 0.4000 mg | INTRAMUSCULAR | Status: DC | PRN

## 2022-04-02 MED ORDER — OXYTOCIN-SODIUM CHLORIDE 30-0.9 UT/500ML-% IV SOLN
INTRAVENOUS | Status: AC
  Filled 2022-04-02: qty 500

## 2022-04-02 MED ORDER — ONDANSETRON HCL 4 MG/2ML IJ SOLN: 4.0000 mg | Freq: Three times a day (TID) | INTRAMUSCULAR | Status: DC | PRN

## 2022-04-02 MED ORDER — OXYTOCIN-SODIUM CHLORIDE 30-0.9 UT/500ML-% IV SOLN: 2.5000 [IU]/h | INTRAVENOUS | Status: AC

## 2022-04-02 MED ORDER — MORPHINE SULFATE (PF) 0.5 MG/ML IJ SOLN
INTRAMUSCULAR | Status: DC | PRN
  Administered 2022-04-02: 100 ug via INTRATHECAL

## 2022-04-02 MED ORDER — OXYTOCIN-SODIUM CHLORIDE 30-0.9 UT/500ML-% IV SOLN
INTRAVENOUS | Status: DC | PRN
  Administered 2022-04-02: 30 [IU] via INTRAVENOUS

## 2022-04-02 MED ORDER — IBUPROFEN 600 MG PO TABS
600.0000 mg | ORAL_TABLET | Freq: Four times a day (QID) | ORAL | Status: AC
  Administered 2022-04-03 – 2022-04-05 (×7): 600 mg via ORAL
  Filled 2022-04-02 (×7): qty 1

## 2022-04-02 MED ORDER — MEPERIDINE HCL 25 MG/ML IJ SOLN: 6.2500 mg | INTRAMUSCULAR | Status: DC | PRN

## 2022-04-02 MED ORDER — ONDANSETRON HCL 4 MG/2ML IJ SOLN
INTRAMUSCULAR | Status: DC | PRN
  Administered 2022-04-02 (×2): 4 mg via INTRAVENOUS

## 2022-04-02 MED ORDER — LABETALOL HCL 200 MG PO TABS
300.0000 mg | ORAL_TABLET | Freq: Two times a day (BID) | ORAL | Status: DC
  Administered 2022-04-02 – 2022-04-03 (×3): 300 mg via ORAL
  Filled 2022-04-02: qty 3
  Filled 2022-04-02: qty 1
  Filled 2022-04-02: qty 3

## 2022-04-02 MED ORDER — SENNOSIDES-DOCUSATE SODIUM 8.6-50 MG PO TABS
2.0000 | ORAL_TABLET | ORAL | Status: DC
  Administered 2022-04-02 – 2022-04-05 (×2): 2 via ORAL
  Filled 2022-04-02 (×3): qty 2

## 2022-04-02 MED ORDER — SODIUM CHLORIDE 0.9% FLUSH: 3.0000 mL | INTRAVENOUS | Status: DC | PRN

## 2022-04-02 MED ORDER — CEFAZOLIN IN SODIUM CHLORIDE 3-0.9 GM/100ML-% IV SOLN
3.0000 g | INTRAVENOUS | Status: AC
  Administered 2022-04-02: 3 g via INTRAVENOUS
  Filled 2022-04-02: qty 100

## 2022-04-02 MED ORDER — EPHEDRINE SULFATE (PRESSORS) 50 MG/ML IJ SOLN
INTRAMUSCULAR | Status: DC | PRN
  Administered 2022-04-02: 10 mg via INTRAVENOUS

## 2022-04-02 SURGICAL SUPPLY — 25 items
ADHESIVE MASTISOL STRL (MISCELLANEOUS) ×1 IMPLANT
BAG COUNTER SPONGE SURGICOUNT (BAG) ×1 IMPLANT
CHLORAPREP W/TINT 26 (MISCELLANEOUS) ×2 IMPLANT
DRSG TELFA 3X8 NADH STRL (GAUZE/BANDAGES/DRESSINGS) ×1 IMPLANT
GAUZE SPONGE 4X4 12PLY STRL (GAUZE/BANDAGES/DRESSINGS) ×1 IMPLANT
GLOVE PI ORTHO PRO STRL 7.5 (GLOVE) ×1 IMPLANT
GOWN STRL REUS W/ TWL LRG LVL3 (GOWN DISPOSABLE) ×2 IMPLANT
GOWN STRL REUS W/TWL LRG LVL3 (GOWN DISPOSABLE) ×2
KIT TURNOVER KIT A (KITS) ×1 IMPLANT
MANIFOLD NEPTUNE II (INSTRUMENTS) ×1 IMPLANT
MAT PREVALON FULL STRYKER (MISCELLANEOUS) ×1 IMPLANT
NS IRRIG 1000ML POUR BTL (IV SOLUTION) ×1 IMPLANT
PACK C SECTION AR (MISCELLANEOUS) ×1 IMPLANT
PAD OB MATERNITY 4.3X12.25 (PERSONAL CARE ITEMS) ×1 IMPLANT
PAD PREP 24X41 OB/GYN DISP (PERSONAL CARE ITEMS) ×1 IMPLANT
RETRACTOR WND ALEXIS-O 25 LRG (MISCELLANEOUS) ×1 IMPLANT
RTRCTR WOUND ALEXIS O 25CM LRG (MISCELLANEOUS) ×1
SCRUB CHG 4% DYNA-HEX 4OZ (MISCELLANEOUS) ×1 IMPLANT
SPONGE T-LAP 18X18 ~~LOC~~+RFID (SPONGE) ×1 IMPLANT
SUT VIC AB 0 CTX 36 (SUTURE) ×2
SUT VIC AB 0 CTX36XBRD ANBCTRL (SUTURE) ×2 IMPLANT
SUT VIC AB 1 CT1 36 (SUTURE) ×2 IMPLANT
SUT VICRYL+ 3-0 36IN CT-1 (SUTURE) ×2 IMPLANT
TRAP FLUID SMOKE EVACUATOR (MISCELLANEOUS) ×1 IMPLANT
WATER STERILE IRR 500ML POUR (IV SOLUTION) ×1 IMPLANT

## 2022-04-02 NOTE — Progress Notes (Signed)
**  note from 04-01-2022 @ 3PM**  Sat with the patient and her husband and discussed her current condition of preeclampsia with severe features and how this would affect her pregnancy.  We discussed her increasing need for antihypertensives.  We have discussed her diabetes. She is aware that delivery is often rapidly curative-improving preeclampsia.  Need for delivery preterm has been discussed.  The timing of this delivery has also been discussed. The benefits of vaginal birth over cesarean were reviewed including decreased hospital stay, decreased morbidity for the mother, increased recovery time and the effect that cesarean delivery has on future pregnancies.  All of her questions were answered. The use of magnesium for neuroprotection and steroids for lung maturation was discussed. The salient points of the MFM consult were also discussed.  After reviewing all of these topics and the systematic manner and answering all of her questions she and her husband have decided that they would like a cesarean delivery.  I think they are making a well-informed decision.  We will plan for cesarean delivery after the Lovenox has been 24 hours.  This will make delivery approximately noon.

## 2022-04-02 NOTE — Anesthesia Preprocedure Evaluation (Signed)
Anesthesia Evaluation  Patient identified by MRN, date of birth, ID band Patient awake    Reviewed: Allergy & Precautions, NPO status , Patient's Chart, lab work & pertinent test results  Airway Mallampati: III  TM Distance: >3 FB Neck ROM: full    Dental  (+) Chipped   Pulmonary neg pulmonary ROS, former smoker   Pulmonary exam normal        Cardiovascular Exercise Tolerance: Good hypertension, negative cardio ROS Normal cardiovascular exam     Neuro/Psych  PSYCHIATRIC DISORDERS Anxiety Depression       GI/Hepatic negative GI ROS,GERD  Controlled,,  Endo/Other  diabetes  Morbid obesity  Renal/GU   negative genitourinary   Musculoskeletal   Abdominal   Peds  Hematology negative hematology ROS (+)   Anesthesia Other Findings Past Medical History: No date: Depression No date: Diabetes ([place])     Comment:  01/2021 No date: GERD (gastroesophageal reflux disease) No date: Jaundice of newborn  Past Surgical History: 01/19/2019: ESOPHAGOGASTRODUODENOSCOPY (EGD) WITH PROPOFOL; N/A     Comment:  Procedure: ESOPHAGOGASTRODUODENOSCOPY (EGD) WITH               PROPOFOL;  Surgeon: Lucilla Lame, MD;  Location: [facility]               ENDOSCOPY;  Service: Endoscopy;  Laterality: N/A; No date: WISDOM TOOTH EXTRACTION     Comment:  3 at diff times; early 35s  BMI    Body Mass Index: 51.46 kg/m      Reproductive/Obstetrics (+) Pregnancy                             Anesthesia Physical Anesthesia Plan  ASA: 3  Anesthesia Plan: Spinal   Post-op Pain Management:    Induction:   PONV Risk Score and Plan:   Airway Management Planned: Natural Airway and Nasal Cannula  Additional Equipment:   Intra-op Plan:   Post-operative Plan:   Informed Consent: I have reviewed the patients History and Physical, chart, labs and discussed the procedure including the risks, benefits and alternatives for the  proposed anesthesia with the patient or authorized representative who has indicated his/her understanding and acceptance.     Dental Advisory Given  Plan Discussed with: Anesthesiologist, CRNA and Surgeon  Anesthesia Plan Comments: (Patient reports no bleeding problems and last anti-coagulant (lovenox) 24 hrs ago. Pt on mg for pre-eclampsia  Plan for spinal with backup GA  Patient consented for risks of anesthesia including but not limited to:  - adverse reactions to medications - damage to eyes, teeth, lips or other oral mucosa - nerve damage due to positioning  - risk of bleeding, infection and or nerve damage from spinal that could lead to paralysis - risk of headache or failed spinal - damage to teeth, lips or other oral mucosa - sore throat or hoarseness - damage to heart, brain, nerves, lungs, other parts of body or loss of life  Patient voiced understanding.)        Anesthesia Quick Evaluation

## 2022-04-02 NOTE — Op Note (Signed)
     OP NOTE  Date: 04/02/2022   1:33 PM Name Patricia Mcbride MR# [id number]  Preoperative Diagnosis: 1. Intrauterine pregnancy at [redacted]w[redacted]d Principal Problem:   Pre-eclampsia during pregnancy in third trimester, antepartum Active Problems:   Pre-existing diabetes mellitus during pregnancy   Obesity affecting pregnancy   Anxiety and depression   Advanced maternal age in multigravida, third trimester   Gestational hypertension w/o significant proteinuria in 2nd trimester   [redacted] weeks gestation of pregnancy  2.  severe preeclampsia remote from delivery  Postoperative Diagnosis: 1. Intrauterine pregnancy at [redacted]w[redacted]d, delivered 2. Viable infant 3. Remainder same as pre-op   Procedure: 1. Primary Low-Transverse Cesarean Section  Surgeon: Finis Bud, MD  Assistant:  Cecilie Kicks   No other capable assistant was available for this surgery which requires an experienced, high level assistant.   Anesthesia: Spinal    EBL: 320  ml     Findings: 1) female infant, Apgar scores of    at 1 minute and    at 5 minutes and a birthweight of 73.37  ounces.    2) Normal uterus, tubes and ovaries.    Procedure:  The patient was prepped and draped in the supine position and placed under spinal anesthesia.  A transverse incision was made across the abdomen in a Pfannenstiel manner. If indicated the old scar was systematically removed with sharp dissection.  We carried the dissection down to the level of the fascia.  The fascia was incised in a curvilinear manner.  The fascia was then elevated from the rectus muscles with blunt and sharp dissection.  The rectus muscles were separated laterally exposing the peritoneum.  The peritoneum was carefully entered with care being taken to avoid bowel and bladder.  A self-retaining retractor was placed.  The visceral peritoneum was incised in a curvilinear fashion across the lower uterine segment creating a bladder flap. A transverse incision was made  across the lower uterine segment and extended laterally and superiorly using the bandage scissors.  Artificial rupture membranes was performed and Clear fluid was noted.  The infant was delivered from the cephalic position.  A nuchal cord was not present. After an appropriate time interval, the cord was doubly clamped and cut. Cord blood was obtained if required.  The infant was handed to the pediatric personnel  who then placed the infant under heat lamps where it was cleaned dried and suctioned as needed. The placenta was delivered. The hysterotomy incision was then identified on ring forceps.  The uterine cavity was cleaned with a moist lap sponge.  The hysterotomy incision was closed with a running interlocking suture of Vicryl. A second layer was placed with imbrication. Hemostasis was excellent.  Pitocin was run in the IV and the uterus was found to be firm. The posterior cul-de-sac and gutters were cleaned and inspected.  Hemostasis was noted.  The fascia was then closed with a running suture of #1 Vicryl.  Hemostasis of the subcutaneous tissues was obtained using the Bovie.  The subcutaneous tissues were closed with a running suture of 000 Vicryl.  A subcuticular suture was placed.  Steri-strips were applied in the usual manner.  A Lidoderm patch was applied.  A pressure dressing was placed.  The patient went to the recovery room in stable condition. [place] CNM provided exposure, dissection, suctioning, retraction, and general support and assistance during the procedure.    Finis Bud, M.D. 04/02/2022 1:33 PM

## 2022-04-02 NOTE — Anesthesia Procedure Notes (Signed)
Spinal  Patient location during procedure: OR Start time: 04/02/2022 12:12 PM End time: 04/02/2022 12:15 PM Reason for block: surgical anesthesia Staffing Performed: resident/CRNA  Anesthesiologist: Alphonsus Sias, MD Resident/CRNA: Hedda Slade, CRNA Performed by: Hedda Slade, CRNA Authorized by: Alphonsus Sias, MD   Preanesthetic Checklist Completed: patient identified, IV checked, site marked, risks and benefits discussed, surgical consent, monitors and equipment checked, pre-op evaluation and timeout performed Spinal Block Patient position: sitting Prep: ChloraPrep Patient monitoring: heart rate, continuous pulse ox, blood pressure and cardiac monitor Approach: midline Location: L3-4 Injection technique: single-shot Needle Needle type: Whitacre and Introducer  Needle gauge: 24 G Needle length: 9 cm Assessment Events: CSF return Additional Notes Sterile aseptic technique used throughout the procedure.  Negative paresthesia. Negative blood return. Positive free-flowing CSF. Expiration date of kit checked and confirmed. Patient tolerated procedure well, without complications.

## 2022-04-02 NOTE — Transfer of Care (Signed)
Immediate Anesthesia Transfer of Care Note  Patient: Patricia Mcbride  Procedure(s) Performed: CESAREAN SECTION  Patient Location: Mother/Baby  Anesthesia Type:Spinal  Level of Consciousness: awake and patient cooperative  Airway & Oxygen Therapy: Patient Spontanous Breathing and Patient connected to nasal cannula oxygen  Post-op Assessment: Report given to RN and Post -op Vital signs reviewed and stable  Post vital signs: Reviewed and stable  Last Vitals:  Vitals Value Taken Time  BP 136/70 04/02/22 1339  Temp    Pulse 85 04/02/22 1339  Resp 16 04/02/22 1339  SpO2 92 % 04/02/22 1339    Last Pain:  Vitals:   04/02/22 0818  TempSrc: Oral  PainSc:       Patients Stated Pain Goal: 0 ([id number])  Complications: No notable events documented.

## 2022-04-02 NOTE — Progress Notes (Signed)
RN at bedside continuously adjusting EFM. Difficulty maintaining continuous tracing d/t vigorous fetal movement.

## 2022-04-03 ENCOUNTER — Other Ambulatory Visit: Payer: 59

## 2022-04-03 ENCOUNTER — Encounter: Payer: Self-pay | Admitting: Obstetrics and Gynecology

## 2022-04-03 LAB — GLUCOSE, CAPILLARY
Glucose-Capillary: 54 mg/dL — ABNORMAL LOW (ref 70–99)
Glucose-Capillary: 62 mg/dL — ABNORMAL LOW (ref 70–99)
Glucose-Capillary: 67 mg/dL — ABNORMAL LOW (ref 70–99)
Glucose-Capillary: 82 mg/dL (ref 70–99)
Glucose-Capillary: 62 mg/dL — ABNORMAL LOW (ref 70–99)
Glucose-Capillary: 88 mg/dL (ref 70–99)
Glucose-Capillary: 71 mg/dL (ref 70–99)

## 2022-04-03 LAB — CBC
HCT: 28.5 % — ABNORMAL LOW (ref 36.0–46.0)
Hemoglobin: 9 g/dL — ABNORMAL LOW (ref 12.0–15.0)
MCH: 28.5 pg (ref 26.0–34.0)
MCHC: 31.6 g/dL (ref 30.0–36.0)
MCV: 90.2 fL (ref 80.0–100.0)
Platelets: 316 10*3/uL (ref 150–400)
RBC: 3.16 MIL/uL — ABNORMAL LOW (ref 3.87–5.11)
RDW: 13.7 % (ref 11.5–15.5)
WBC: 13.3 10*3/uL — ABNORMAL HIGH (ref 4.0–10.5)
nRBC: 0 % (ref 0.0–0.2)

## 2022-04-03 MED ORDER — INSULIN GLARGINE-YFGN 100 UNIT/ML ~~LOC~~ SOLN
16.0000 [IU] | Freq: Every day | SUBCUTANEOUS | Status: DC
  Filled 2022-04-03: qty 0.16

## 2022-04-03 MED ORDER — INSULIN GLARGINE-YFGN 100 UNIT/ML ~~LOC~~ SOLN
14.0000 [IU] | Freq: Every morning | SUBCUTANEOUS | Status: DC
  Administered 2022-04-03: 14 [IU] via SUBCUTANEOUS
  Filled 2022-04-03: qty 0.14

## 2022-04-03 MED ORDER — INSULIN ASPART 100 UNIT/ML IJ SOLN
0.0000 [IU] | Freq: Three times a day (TID) | INTRAMUSCULAR | Status: DC
  Administered 2022-04-06: 1 [IU] via SUBCUTANEOUS
  Filled 2022-04-03: qty 1

## 2022-04-03 MED ORDER — ASPIRIN 81 MG PO TBEC
81.0000 mg | DELAYED_RELEASE_TABLET | Freq: Every day | ORAL | Status: DC
  Administered 2022-04-03 – 2022-04-04 (×2): 81 mg via ORAL
  Filled 2022-04-03 (×2): qty 1

## 2022-04-03 NOTE — Anesthesia Post-op Follow-up Note (Signed)
  Anesthesia Pain Follow-up Note  Patient: Patricia Mcbride  Day #: 1  Date of Follow-up: 04/03/2022 Time: 8:28 AM  Last Vitals:  Vitals:   04/03/22 0734 04/03/22 0735  BP: (!) 150/79   Pulse: 73   Resp: 18   Temp: 36.7 C   SpO2:  91%    Level of Consciousness: alert  Pain: mild   Side Effects:None  Catheter Site Exam:clean, dry, no drainage  Anti-Coag Meds (From admission, onward)    Start     Dose/Rate Route Frequency Ordered Stop   04/02/22 1730  heparin injection 5,000 Units        5,000 Units Subcutaneous Every 8 hours 04/02/22 1639          Plan: D/C from anesthesia care at surgeon's request  Geralene Afshar,  Clearnce Sorrel

## 2022-04-03 NOTE — Inpatient Diabetes Management (Addendum)
Inpatient Diabetes Program Recommendations  AACE/ADA: New Consensus Statement on Inpatient Glycemic Control (2015)  Target Ranges:  Prepandial:   less than 140 mg/dL      Peak postprandial:   less than 180 mg/dL (1-2 hours)      Critically ill patients:  140 - 180 mg/dL   Lab Results  Component Value Date   GLUCAP 82 04/03/2022   HGBA1C 6.9 (H) 11/02/2021    Review of Glycemic Control  Latest Reference Range & Units 04/03/22 08:37 04/03/22 08:59 04/03/22 09:15 04/03/22 09:31  Glucose-Capillary 70 - 99 mg/dL 54 (L) 62 (L) 67 (L) 82  (L): Data is abnormally low  Diabetes history: DM Outpatient Diabetes medications: No insulin prior to pregnancy was taking Ozempic weekly and Metformin 500 QHS  Prior to delivery- Toujeo 24 units QAM & 34 QPM Metformin 500 mg QD  Current orders for Inpatient glycemic control:  Semglee 14 units QAM, 16 units QPM (was decreased today for hypoglycemia), Novolog 0-20 units TID Metformin 500 mg BID  Inpatient Diabetes Program Recommendations:    Please discontinue Semglee Decrease Novolog correction to 0-9 units TID  Will continue to follow while inpatient.  Thank you, Reche Dixon, MSN, [place] Diabetes Coordinator Inpatient Diabetes Program [phone] (team pager from 8a-5p)

## 2022-04-03 NOTE — Progress Notes (Signed)
CBG 54 @ 0837. RN gave apple juice. Recheck @ 0859 CBG 62. RN gave apple juice and graham crackers with peanut butter.  Recheck @ 0915 CBG 67. Patient eating breakfast.  Recheck @ 0931 CBG 82.  Dr. Amalia Hailey and Dr. Marcelline Mates notified and updated on hypoglycemic episode.

## 2022-04-03 NOTE — Lactation Note (Signed)
This note was copied from a baby's chart. Lactation Consultation Note  Patient Name: Patricia Mcbride Today's Date: 04/03/2022 Age:40 hours Reason for consult: Initial assessment;Primapara;NICU baby;Preterm <34wks;Infant < 6lbs   Maternal Data Has patient been taught Hand Expression?: Yes Does the patient have breastfeeding experience prior to this delivery?: No  P1, c-section 28 hours ago. Mom admitted early due to severe Pre-E, multiple medications (high doses) used for control; opted for c-section as soon as able. Hx of depression, GERD, diabetes. Mom remained on Mag2+ for 24 hours post delivery in L&D. Due to medications, and time spent in hospital, mom was in poor condition to begin pumping immediately. Pump initiation started with this LC's visit into room.  Feeding Mother's Current Feeding Choice: Breast Milk and Formula  Baby currently receiving donor breast milk.   Lactation Tools Discussed/Used Tools: Pump Breast pump type: Double-Electric Breast Pump Pump Education: Setup, frequency, and cleaning;Milk Storage Reason for Pumping: SCN; 31 weeks Pumping frequency: q 3 hours  Pump set-up at bedside. Mom and support person [facility]) at bedside were educated on pump parts/pieces, set-up of the pump, cleaning, and recommended frequency. LC at bedside during first phase of pumping (through stimulation and expression phase), assisted with adjustment of suction level. Guidance given on optimizing output, difficulty extraction of colostrum, importance of breast stimulation, hand expression before and after pumping.  Interventions Interventions: DEBP;Education  Briefly discussed feeding goals, early feeding behaviors of pre-term babies, how to initiate breastfeeding with lick and learn when baby is able and ready, and pumping while at home post discharge.  Discharge Pump: Personal  Consult Status Consult Status: NICU follow-up    Lavonia Drafts 04/03/2022, 4:59 PM

## 2022-04-03 NOTE — Anesthesia Postprocedure Evaluation (Signed)
Anesthesia Post Note  Patient: Patricia Mcbride  Procedure(s) Performed: CESAREAN SECTION  Patient location during evaluation: L&D Anesthesia Type: Spinal Level of consciousness: oriented and awake and alert Pain management: pain level controlled Vital Signs Assessment: post-procedure vital signs reviewed and stable Respiratory status: spontaneous breathing and respiratory function stable Cardiovascular status: blood pressure returned to baseline and stable Postop Assessment: no headache, no backache, no apparent nausea or vomiting and able to ambulate Anesthetic complications: no   No notable events documented.   Last Vitals:  Vitals:   04/03/22 0734 04/03/22 0735  BP: (!) 150/79   Pulse: 73   Resp: 18   Temp: 36.7 C   SpO2:  91%    Last Pain:  Vitals:   04/03/22 0734  TempSrc: Oral  PainSc: 0-No pain                 Saanvika Vazques,  Clearnce Sorrel

## 2022-04-03 NOTE — Progress Notes (Signed)
Progress Note - Cesarean Delivery  Patricia Mcbride is a 42 y.o. [phone] now PP day 1 s/p C-Section, Low Transverse .   Subjective:  Patient reports no problems with eating, bowel movements, voiding, or their wound  Says she feels much better.  She denies pain.  Objective:  Vital signs in last 24 hours: Temp:  [98 F (36.7 C)-98.9 F (37.2 C)] 98 F (36.7 C) (03/13 0734) Pulse Rate:  [62-91] 79 (03/13 0845) Resp:  [10-29] 18 (03/13 0830) BP: (103-175)/(68-90) 146/73 (03/13 0845) SpO2:  [85 %-97 %] 95 % (03/13 0905)  Physical Exam:  General: alert, cooperative, and no distress Lochia: appropriate Uterine Fundus: firm Incision: Dressing intact    Data Review Recent Labs    04/02/22 0535 04/03/22 0721  HGB 10.7* 9.0*  HCT 32.5* 28.5*    Assessment:  Principal Problem:   Pre-eclampsia during pregnancy in third trimester, antepartum Active Problems:   Pre-existing diabetes mellitus during pregnancy   Obesity affecting pregnancy   Anxiety and depression   Advanced maternal age in multigravida, third trimester   Gestational hypertension w/o significant proteinuria in 2nd trimester   [redacted] weeks gestation of pregnancy   Status post Cesarean section. Doing well postoperatively.   Blood pressure is now controlled after increasing antihypertensive medications  Remains on magnesium  Short hypoglycemic episode this morning but now resolved.  Urine output greatly increased-patient now diuresing  Plan:       Continue current care.  Discontinue magnesium approximately noon  Continue antihypertensive medications  Probable transfer to mother-baby  Finis Bud, M.D. 04/03/2022 9:35 AM

## 2022-04-04 ENCOUNTER — Other Ambulatory Visit: Payer: Managed Care, Other (non HMO)

## 2022-04-04 ENCOUNTER — Other Ambulatory Visit: Payer: 59

## 2022-04-04 ENCOUNTER — Encounter: Payer: Managed Care, Other (non HMO) | Admitting: Obstetrics and Gynecology

## 2022-04-04 LAB — TYPE AND SCREEN
ABO/RH(D): B POS
ABO/RH(D): B POS
Antibody Screen: NEGATIVE
Antibody Screen: NEGATIVE

## 2022-04-04 LAB — GLUCOSE, CAPILLARY
Glucose-Capillary: 61 mg/dL — ABNORMAL LOW (ref 70–99)
Glucose-Capillary: 79 mg/dL (ref 70–99)
Glucose-Capillary: 96 mg/dL (ref 70–99)
Glucose-Capillary: 92 mg/dL (ref 70–99)
Glucose-Capillary: 105 mg/dL — ABNORMAL HIGH (ref 70–99)

## 2022-04-04 MED ORDER — FUROSEMIDE 20 MG PO TABS
20.0000 mg | ORAL_TABLET | Freq: Every day | ORAL | Status: DC
  Filled 2022-04-04: qty 1

## 2022-04-04 MED ORDER — FUROSEMIDE 20 MG PO TABS
20.0000 mg | ORAL_TABLET | Freq: Every day | ORAL | Status: DC
  Administered 2022-04-05 – 2022-04-06 (×2): 20 mg via ORAL
  Filled 2022-04-04 (×2): qty 1

## 2022-04-04 MED ORDER — METOPROLOL SUCCINATE ER 25 MG PO TB24
50.0000 mg | ORAL_TABLET | Freq: Every day | ORAL | Status: DC
  Administered 2022-04-04: 50 mg via ORAL
  Filled 2022-04-04 (×2): qty 2

## 2022-04-04 NOTE — Progress Notes (Signed)
Postpartum Day # 2: Cesarean Delivery (primary) of preterm 31 week infant, gestational HTN with superimposed pre-eclampsia with severe features s/p magnesium sulfate, Type II DM  Subjective: Patient reports tolerating PO, + flatus, and no problems voiding.  Ambulating without difficulty.  Bleeding is light to moderate.  Attempting to pump as infant in NICU  Objective: Vital signs in last 24 hours: Temp:  [97.7 F (36.5 C)-98.7 F (37.1 C)] 98 F (36.7 C) (03/14 0333) Pulse Rate:  [68-90] 84 (03/14 0333) Resp:  [16-18] 18 (03/14 0333) BP: (146-164)/(70-90) 149/81 (03/14 0415) SpO2:  [89 %-96 %] 92 % (03/14 0333)  Physical Exam:  General: alert and no distress Lungs: clear to auscultation bilaterally Breasts: normal appearance, no masses or tenderness Heart: regular rate and rhythm, S1, S2 normal, no murmur, click, rub or gallop Abdomen: soft, non-tender; bowel sounds normal; no masses,  no organomegaly Pelvis: Lochia appropriate, Uterine Fundus firm, Incision: healing well, no significant drainage, no dehiscence, no significant erythema Extremities: DVT Evaluation: No evidence of DVT seen on physical exam. Negative Homan's  sign. No cords or calf tenderness. No significant calf/ankle edema.   Recent Labs    04/02/22 0535 04/03/22 0721  HGB 10.7* 9.0*  HCT 32.5* 28.5*     Latest Reference Range & Units 04/03/22 08:59 04/03/22 09:15 04/03/22 09:31 04/03/22 18:27 04/03/22 18:52 04/03/22 23:13  Glucose-Capillary 70 - 99 mg/dL 62 (L) 67 (L) 82 62 (L) 88 71  (L): Data is abnormally low   Assessment/Plan: - Status post Cesarean section. Doing well postoperatively.  - Carb modified diet as tolerated - Continue PO pain management - Will hold Metformin as patient with low blood sugars throughout the day yesterday. Held usual insulin regimen yesterday, can use sliding scale for any elevated blood sugars today.  - Mild anemia postpartum (iron deficiency). Asymptoomatic. Can treat with  PO iron - Superimposed pre-eclampsia on GHTN, BPs still very labile with majority moderately elevated BPs. Will change from Labetalol to Metoprolol. May  also consider adding Lasix for the next several days, however patient currently diuresing well.  Continue current care. Discharge home later today if BPs better controlled (patient's request) or d/c home in a.m.   Infant to remain in NICU for prematurity.    Rubie Maid, MD [place] [facility] at [facility]

## 2022-04-05 ENCOUNTER — Encounter: Payer: Managed Care, Other (non HMO) | Admitting: Obstetrics & Gynecology

## 2022-04-05 ENCOUNTER — Other Ambulatory Visit: Payer: Managed Care, Other (non HMO)

## 2022-04-05 LAB — GLUCOSE, CAPILLARY
Glucose-Capillary: 98 mg/dL (ref 70–99)
Glucose-Capillary: 112 mg/dL — ABNORMAL HIGH (ref 70–99)
Glucose-Capillary: 100 mg/dL — ABNORMAL HIGH (ref 70–99)
Glucose-Capillary: 138 mg/dL — ABNORMAL HIGH (ref 70–99)

## 2022-04-05 MED ORDER — HYDRALAZINE HCL 20 MG/ML IJ SOLN
10.0000 mg | Freq: Once | INTRAMUSCULAR | Status: AC
  Administered 2022-04-05: 10 mg via INTRAMUSCULAR
  Filled 2022-04-05: qty 1

## 2022-04-05 MED ORDER — METOPROLOL SUCCINATE ER 100 MG PO TB24
100.0000 mg | ORAL_TABLET | Freq: Every day | ORAL | Status: DC
  Administered 2022-04-05: 100 mg via ORAL
  Filled 2022-04-05: qty 1

## 2022-04-05 NOTE — Progress Notes (Signed)
Progress Note - Cesarean Delivery  Patricia Mcbride is a 42 y.o. [phone] now PP day 3 s/p C-Section, Low Transverse .   Subjective:  Patient reports no problems with eating, bowel movements, voiding, or their wound  She reports her pain is controlled.  She is somewhat tearful today after our discussion because she was considering going home and I have asked her to stay in at least until tomorrow.  She is currently holding her baby in the NICU.  Objective:  Vital signs in last 24 hours: Temp:  [98.4 F (36.9 C)-98.9 F (37.2 C)] 98.7 F (37.1 C) (03/15 1206) Pulse Rate:  [59-88] 83 (03/15 1206) Resp:  [14-20] 18 (03/15 1206) BP: (137-170)/(70-90) 137/84 (03/15 1206) SpO2:  [96 %-99 %] 99 % (03/15 1206)  Physical Exam:  General: alert, cooperative, and no distress Lochia: appropriate Uterine Fundus: firm    Data Review Recent Labs    04/03/22 0721  HGB 9.0*  HCT 28.5*    Assessment:  Principal Problem:   Pre-eclampsia during pregnancy in third trimester, antepartum Active Problems:   Pre-existing diabetes mellitus during pregnancy   Obesity affecting pregnancy   Anxiety and depression   Advanced maternal age in multigravida, third trimester   Gestational hypertension w/o significant proteinuria in 2nd trimester   [redacted] weeks gestation of pregnancy   Status post Cesarean section. Doing well postoperatively.   Hypertension has not been well-controlled since delivery.  Blood sugars controlled     Plan:       Continue current care.  Current plan is to continue Procardia and increase metoprolol today.  Hopefully will not have to further supplement treatments.  If blood pressure still not controlled consider hospitalist consult.   I have discussed all of this with the patient in some detail.  Finis Bud, M.D. 04/05/2022 12:09 PM

## 2022-04-05 NOTE — Progress Notes (Signed)
Postpartum Day # 3: Cesarean Delivery (primary) of preterm 31 week infant, gestational HTN with superimposed pre-eclampsia with severe features s/p magnesium sulfate, Type II DM  Subjective: Patient in room crying, new IV being attempted due to severe range blood pressures.   Objective: Vitals:   04/04/22 2230 04/04/22 2246 04/05/22 0350 04/05/22 0805  BP: (!) 170/81 (!) 158/90 (!) 158/81 (!) 161/83  Pulse: 82 (!) 59 68 88  Resp: 18  20 14   Temp: 98.4 F (36.9 C)  98.9 F (37.2 C) 98.4 F (36.9 C)  TempSrc: Oral  Oral Oral  SpO2: 97% 97% 96% 98%  Weight:      Height:        Physical Exam:  General: alert and moderate distress Lungs: clear to auscultation bilaterally Breasts: normal appearance, no masses or tenderness Heart: regular rate and rhythm, S1, S2 normal, no murmur, click, rub or gallop Abdomen: soft, non-tender; bowel sounds normal; no masses,  no organomegaly Pelvis: Lochia appropriate, Uterine Fundus firm, Incision: healing well, no significant drainage, no dehiscence, no significant erythema Extremities: DVT Evaluation: No evidence of DVT seen on physical exam. Negative Homan's  sign. No cords or calf tenderness. Plus 1 to +2 edema up to calves (slightly improved since yesterday.)   Recent Labs    04/03/22 0721  HGB 9.0*  HCT 28.5*     Latest Reference Range & Units 04/04/22 08:19 04/04/22 09:09 04/04/22 12:10 04/04/22 18:05 04/04/22 22:37 04/05/22 08:07  Glucose-Capillary 70 - 99 mg/dL 61 (L) 79 96 92 105 (H) 98  (L): Data is abnormally low (H): Data is abnormally high   Assessment/Plan: - Status post Cesarean section.  - Carb modified  - Continue PO pain management - Holding all hyperglycemic agents at this time. Blood sugars wnl.  - Mild anemia postpartum (iron deficiency). Asymptomatic. Can treat with PO iron - Superimposed pre-eclampsia on GHTN, BPs still labile  with occasional severe range.  On Metoprolol 50 mg, increased to 100 mg. Added Lasix to  start this a.m.  Continue Procardia 60 mg BID.  If still no improvement today, may need to consider hospitalist. IV currently not in place, patient a difficult stick and has significant anxiety currently, will order a dose of IM hydralazine to manage until PO meds administered this morning.  - Continue current routine postpartum/post-op care. - Discharge home later today if BPs better controlled (patient's request) or d/c home in a.m.   - Infant to remain in NICU for prematurity.    Rubie Maid, MD [place] [facility] at [facility]

## 2022-04-06 DIAGNOSIS — O165 Unspecified maternal hypertension, complicating the puerperium: Secondary | ICD-10-CM | POA: Insufficient documentation

## 2022-04-06 DIAGNOSIS — R030 Elevated blood-pressure reading, without diagnosis of hypertension: Secondary | ICD-10-CM

## 2022-04-06 LAB — GLUCOSE, CAPILLARY
Glucose-Capillary: 113 mg/dL — ABNORMAL HIGH (ref 70–99)
Glucose-Capillary: 133 mg/dL — ABNORMAL HIGH (ref 70–99)

## 2022-04-06 MED ORDER — ACETAMINOPHEN 500 MG PO TABS: 1000.0000 mg | ORAL_TABLET | Freq: Four times a day (QID) | ORAL | 0 refills | Status: DC | PRN

## 2022-04-06 MED ORDER — IBUPROFEN 600 MG PO TABS: 600.0000 mg | ORAL_TABLET | Freq: Four times a day (QID) | ORAL | 0 refills | Status: DC | PRN

## 2022-04-06 MED ORDER — OXYCODONE-ACETAMINOPHEN 5-325 MG PO TABS: 1.0000 | ORAL_TABLET | ORAL | 0 refills | Status: DC | PRN

## 2022-04-06 MED ORDER — NIFEDIPINE ER 60 MG PO TB24: 60.0000 mg | ORAL_TABLET | Freq: Two times a day (BID) | ORAL | 2 refills | Status: DC

## 2022-04-06 MED ORDER — METOPROLOL SUCCINATE ER 50 MG PO TB24: 150.0000 mg | ORAL_TABLET | Freq: Every day | ORAL | 2 refills | Status: DC

## 2022-04-06 MED ORDER — METOPROLOL SUCCINATE ER 25 MG PO TB24
150.0000 mg | ORAL_TABLET | Freq: Every day | ORAL | Status: DC
  Administered 2022-04-06: 150 mg via ORAL
  Filled 2022-04-06: qty 6

## 2022-04-06 MED ORDER — FUROSEMIDE 20 MG PO TABS: 20.0000 mg | ORAL_TABLET | Freq: Every day | ORAL | 0 refills | Status: DC

## 2022-04-06 MED ORDER — IBUPROFEN 600 MG PO TABS
600.0000 mg | ORAL_TABLET | Freq: Four times a day (QID) | ORAL | Status: DC | PRN
  Administered 2022-04-06: 600 mg via ORAL
  Filled 2022-04-06: qty 1

## 2022-04-06 NOTE — Lactation Note (Signed)
This note was copied from a baby's chart. Lactation Consultation Note  Patient Name: Patricia Mcbride Today's Date: 04/06/2022 Age:40 days Reason for consult: Follow-up assessment;Primapara;NICU baby;Preterm <34wks   Maternal Data Does the patient have breastfeeding experience prior to this delivery?: No Mom has decided to discontinue pumping her breasts, she is overwhelmed and tired and feels this is best for her and family at this time, she is being d/c'd today without her baby.   Feeding Mother's Current Feeding Choice:  (donor breast milk)  LATCH Score                    Lactation Tools Discussed/Used    Interventions Interventions:  (mom requested to discontinue pumping, engorgement and breast care discussed if milk increases after d/c) If she feels that she wants to try pumping again after she rests and feels less stress, and if her breasts start filling post d/c,  she may try pumping again and LC will be available to assist with any questions or concerns.  She does not want to take any breast pump parts home with her.  Discharge Discharge Education: Engorgement and breast care [place] Program: No  Consult Status Consult Status: PRN    Ferol Luz 04/06/2022, 4:02 PM

## 2022-04-06 NOTE — Consult Note (Addendum)
Initial Consultation Note   Patient: Patricia Mcbride DOB: [birth year] PCP: Berniece Salines, FNP DOA: 03/28/2022 DOS: the patient was seen and examined on 04/06/2022 Primary service: Doreene Burke, CNM  Referring physician: Carie Caddy CMN  Reason for consult: HTN   Assessment/Plan: Assessment and Plan: Postpartum hypertension Patient noted to be in the postpartum period 3 days after C-section with noted preeclampsia during her third trimester pregnancy Systolic blood pressures have been in the 140s to 160s Patient is currently on metoprolol 100 mg and Procardia 60 mg twice daily Initial plan is to increase metoprolol to 150 mg daily as well as continue Procardia 60 mg twice daily Will also continue Lasix 20 mg daily pending follow-up in the next 2 days Next step would be to add on low-dose hydralazine 25 mg twice daily Will otherwise continue to monitor alongside primary team pending discharge.        TR[facility] will continue to follow the patient.  HPI: Patricia Mcbride is a 42 y.o. female with past medical history of obesity, depression, diabetes who is day 3 postpartum status post C-section with noted preeclampsia during her third trimester pregnancy.  We were consulted to help with blood pressure management.  Patient noted to be approximately 3 days postpartum.  Was started on Procardia as well as metoprolol to help with blood pressure in the postpartum period.  Patient currently denies any chest pain or shortness of breath.  Was given Lasix postoperatively.  Patient states that she is now back to a fairly euvolemic state.  No headaches.  No vision changes.  Systolic blood pressures have been in the 140s to 150s.  Was taking metoprolol 100 mg daily as well as Procardia 60 mg twice daily.  Per the midwife Isabelle Course, patient has potential close follow-up to be seen within the next 24 to 48 hours in outpatient clinic. Currently patient is afebrile, blood pressures are 130s  to 160s over 60s to 80s.  Satting well on room air.  Glucose in the 100s.   Review of Systems: As mentioned in the history of present illness. All other systems reviewed and are negative. Past Medical History:  Diagnosis Date   Depression    Diabetes (HCC)    01/2021   GERD (gastroesophageal reflux disease)    Jaundice of newborn    Past Surgical History:  Procedure Laterality Date   CESAREAN SECTION N/A 04/02/2022   Procedure: CESAREAN SECTION;  Surgeon: Linzie Collin, MD;  Location: [facility] ORS;  Service: Obstetrics;  Laterality: N/A;   ESOPHAGOGASTRODUODENOSCOPY (EGD) WITH PROPOFOL N/A 01/19/2019   Procedure: ESOPHAGOGASTRODUODENOSCOPY (EGD) WITH PROPOFOL;  Surgeon: Midge Minium, MD;  Location: [facility] ENDOSCOPY;  Service: Endoscopy;  Laterality: N/A;   WISDOM TOOTH EXTRACTION     3 at diff times; early 36s   Social History:  reports that she has quit smoking. She has never used smokeless tobacco. She reports that she does not currently use alcohol. She reports that she does not currently use drugs.  Allergies  Allergen Reactions   Morphine Nausea Only    Family History  Problem Relation Age of Onset   Depression Mother    Asthma Mother    Depression Father    Hypertension Father    Cancer Father 44       skin   Diabetes Father    Hyperlipidemia Father    Depression Sister    Hearing loss Sister    Miscarriages / [place] Sister    Depression Sister  Hypertension Sister    Asthma Maternal Grandmother    Diabetes Maternal Grandmother    Hearing loss Maternal Grandmother    Heart disease Maternal Grandmother    Hyperlipidemia Maternal Grandmother    Hypertension Maternal Grandmother    Kidney disease Maternal Grandmother    Dementia Paternal Grandmother    Kidney disease Paternal Grandmother    Hypertension Paternal Grandmother    Hyperlipidemia Paternal Grandmother    Heart disease Paternal Grandmother    Alzheimer's disease Paternal Grandmother     Alzheimer's disease Paternal Grandfather    Heart disease Paternal Grandfather    Hyperlipidemia Paternal Grandfather    Hypertension Paternal Grandfather    Kidney disease Paternal Grandfather    Dementia Paternal Grandfather     Prior to Admission medications   Medication Sig Start Date End Date Taking? Authorizing Provider  aspirin EC 81 MG tablet Take 1 tablet (81 mg total) by mouth daily. Take after 12 weeks for prevention of preeclampssia later in pregnancy 12/07/21  Yes Hildred Laser, MD  citalopram (CELEXA) 20 MG tablet Take 2 tablets (40 mg total) by mouth daily. 11/16/21  Yes Mirna Mires, CNM  famotidine (PEPCID) 20 MG tablet Take 20 mg by mouth daily.   Yes [provider]  Glucagon (GVOKE HYPOPEN 1-PACK) 1 MG/0.2ML SOAJ Inject 1 mg into the skin as needed (for hypoglycemin). 10/03/21  Yes Berniece Salines, FNP  insulin glargine, 1 Unit Dial, (TOUJEO) 300 UNIT/ML Solostar Pen Inject 18 units in AM with breakfast, and 24 units in PM with dinner Patient taking differently: 46 Units daily. Inject 24 units in AM with breakfast, and 34 units in PM with dinner 02/25/22  Yes Hildred Laser, MD  metFORMIN (GLUCOPHAGE) 500 MG tablet Take 1 tablet (500 mg total) by mouth at bedtime. 03/28/22  Yes Dove, Myra C, MD  Prenatal Vit-Fe Fumarate-FA (MULTIVITAMIN-PRENATAL) 27-0.8 MG TABS tablet Take 1 tablet by mouth daily at 12 noon.   Yes [provider]  Insulin Pen Needle (NOVOFINE PEN NEEDLE) 32G X 6 MM MISC 1 each by Does not apply route once a week. 06/29/21   Berniece Salines, FNP    Physical Exam: Vitals:   04/05/22 2031 04/05/22 2251 04/06/22 0439 04/06/22 0832  BP: 133/70 (!) 146/64 137/82 (!) 146/78  Pulse: 75 71 73 78  Resp: 18 18 18 20   Temp: 97.9 F (36.6 C) 98.7 F (37.1 C) 98.7 F (37.1 C) 99 F (37.2 C)  TempSrc: Oral Oral Oral Oral  SpO2: 100%  98% 100%  Weight:      Height:       Physical Exam Constitutional:      General: She is not in acute  distress.    Appearance: She is obese.  HENT:     Head: Normocephalic and atraumatic.     Mouth/Throat:     Mouth: Mucous membranes are moist.  Eyes:     Pupils: Pupils are equal, round, and reactive to light.  Cardiovascular:     Rate and Rhythm: Normal rate and regular rhythm.  Pulmonary:     Effort: Pulmonary effort is normal.  Abdominal:     General: Abdomen is flat. Bowel sounds are normal.  Musculoskeletal:        General: Normal range of motion.     Comments: Trace edema bilaterally  Skin:    General: Skin is warm.  Neurological:     General: No focal deficit present.  Psychiatric:  Mood and Affect: Mood normal.     Data Reviewed:   There are no new results to review at this time.    Family Communication: Discussed w/ patient in the nursery  Primary team communication: Yes  Thank you very much for involving [place] in the care of your patient.   Greater than 60 minutes spent in care, management and evaluation of this patient Author: Floydene Flock, MD 04/06/2022 11:42 AM  For on [place] review [email or web address].

## 2022-04-06 NOTE — Discharge Instructions (Signed)
Discharge instructions:   [place] office if you have any of the following: headache, visual changes, fever >101 F, chills, breast concerns, excessive vaginal bleeding, leg pain or redness, depression or any other concerns.   Activity: Do not lift > 20 lbs for 6 weeks.  No intercourse or tampons for 6 weeks.  No driving for 1-2 weeks.   [place] your doctor for increased pain or vaginal bleeding, temperature above 101.0, depression, or concerns.  No strenuous activity or heavy lifting for 6 weeks.  No intercourse, tampons, douching, or enemas for 6 weeks.  Tub baths and showers are ok.  No driving for 2 weeks or while taking pain medications.  Continue prenatal vitamin and iron.  Increase calories and fluids while breastfeeding.  For concerns about your baby please [place] the pediatrician For breastfeeding issues please remember to [place] our lactation consultants. 

## 2022-04-06 NOTE — Discharge Summary (Cosign Needed Addendum)
Obstetrical Discharge Summary  Date of Admission: 03/28/2022 Date of Discharge: 04/06/2022  Primary OB: [facility]   Gestational Age at Delivery: [redacted]w[redacted]d   Antepartum complications: [id number], preeclampsia with severe features Reason for Admission: Preeclampsia with severe features  Date of Delivery: 04/02/2022 Delivered By: Dr Waynetta Pean  Delivery Type: primary cesarean section, low vertical incision Intrapartum complications/course: elevated Blood pressures requiring IV medication Anesthesia: spinal Placenta: Delivered and expressed via active management. Intact: yes. To pathology: no.  Laceration: none Episiotomy: LCT uterus EBL: 370ml Baby: Liveborn female, APGARs 7/8, weight 2080 g.    Discharge Diagnosis: Delivered. See op note  Postpartum course: PP course complicated by elevated blood pressures. Her metoprolol was increased to 150mg  today. Her blood pressure at 1244 was 138/83.   Pt doing well. She is ambulating to the SCN without difficulty. Voiding and stooling without difficulty. Pain is managed with Motrin and Tylenol-still with some discomfort, pt resistant to Narcotics so has not tried Percocet. Appetite is back to her normal. Bleeding WNL. Her mother is at her side. Her partner is at home getting the house ready. She has decided to not breastfeed as pumping was causing "one more thing" to deal with and feel anxious about.  She admits to a hx of anxiety, feels her mood is where she thinks she should be- having episodes of tearfulness but overall feels "fine". Feels ready to go home.   Discharge Vital Signs:  Current Vital Signs 24h Vital Sign Ranges  T 98.1 F (36.7 C) Temp  Avg: 98.5 F (36.9 C)  Min: 97.9 F (36.6 C)  Max: 99 F (37.2 C)  BP 138/83 BP  Min: 133/70  Max: 151/80  HR 79 Pulse  Avg: 75.8  Min: 71  Max: 79  RR 18 Resp  Avg: 18.7  Min: 18  Max: 20  SaO2 100 % Room Air SpO2  Avg: 99 %  Min: 98 %  Max: 100 %       24 Hour I/O Current Shift I/O  Time Ins Outs  No intake/output data recorded. 03/16 0701 - 03/16 1900 In: 240 [P.O.:240] Out: -     Discharge Exam:  NAD Perineum: NA Abdomen: firm fundus below the umbilicus, NTTP, non distended, +bowel sounds.  Incision c/d/i with honeycomb dressing RRR no MRGs CTAB Ext: trace to +1 BLE, negative homan's sign.   Recent Labs  Lab 04/01/22 1004 04/02/22 0535 04/03/22 0721  WBC 15.1* 13.0* 13.3*  HGB 11.3* 10.7* 9.0*  HCT 34.9* 32.5* 28.5*  PLT 366 335 316    Disposition: Home  Rh Immune globulin given: no Rubella vaccine given: no Tdap vaccine given in AP or PP setting: yes Flu vaccine given in AP or PP setting: no  Contraception: bilateral tubal ligation  Prenatal/Postnatal Panel: B POS//Rubella Immune//Varicella Immune//RPR negative//HIV negative/HepB Surface Ag negative//pap no abnormalities (date: 08/10/2021)//plans to bottle feed  Plan:  Lysle Morales Willmott was discharged to home in good condition. Warning signs/when to [place] given.  Follow-up appointment with any provider  in 2 days for a BP check, in 1-2 wks with DJE for incision check then in 6 wks for PP visit   Pt will [place] first thing Monday morning to arrange appointments   Preeclampsia/elevated blood pressures: pt will remain on Procardia XL 60mg  BID, Metoprolol 150mg  daily and Lasix 20mg  daily x 3 days (total of 5 days of lasix traetment)   Pt instructed to obtain BP cuff and check BP twice a day, bring her BP  readings and cuff to next appointment.   T2DM: pt will start Metformin 500mg  BID and Haser Serafina Royals first thing  Monday morning to arrange follow up.   No future appointments.  Discharge Medications: Allergies as of 04/06/2022       Reactions   Morphine Nausea Only        Medication List     STOP taking these medications    aspirin EC 81 MG tablet   insulin glargine (1 Unit Dial) 300 UNIT/ML Solostar Pen Commonly known as: TOUJEO       TAKE these medications    acetaminophen 500 MG  tablet Commonly known as: TYLENOL Take 2 tablets (1,000 mg total) by mouth every 6 (six) hours as needed for mild pain.   citalopram 20 MG tablet Commonly known as: CeleXA Take 2 tablets (40 mg total) by mouth daily.   famotidine 20 MG tablet Commonly known as: PEPCID Take 20 mg by mouth daily.   furosemide 20 MG tablet Commonly known as: LASIX Take 1 tablet (20 mg total) by mouth daily for 3 days. Start taking on: April 07, 2022   Gvoke HypoPen 1-Pack 1 MG/0.2ML Soaj Generic drug: Glucagon Inject 1 mg into the skin as needed (for hypoglycemin).   ibuprofen 600 MG tablet Commonly known as: ADVIL Take 1 tablet (600 mg total) by mouth every 6 (six) hours as needed for fever or headache.   metFORMIN 500 MG tablet Commonly known as: GLUCOPHAGE Take 1 tablet (500 mg total) by mouth at bedtime.   metoprolol succinate 50 MG 24 hr tablet Commonly known as: TOPROL-XL Take 3 tablets (150 mg total) by mouth daily. Start taking on: April 07, 2022   multivitamin-prenatal 27-0.8 MG Tabs tablet Take 1 tablet by mouth daily at 12 noon.   NIFEdipine 60 MG 24 hr tablet Commonly known as: ADALAT CC Take 1 tablet (60 mg total) by mouth 2 (two) times daily.   Novofine Pen Needle 32G X 6 MM Misc Generic drug: Insulin Pen Needle 1 each by Does not apply route once a week.   oxyCODONE-acetaminophen 5-325 MG tablet Commonly known as: PERCOCET/ROXICET Take 1-2 tablets by mouth every 4 (four) hours as needed for moderate pain.               Discharge Care Instructions  (From admission, onward)           Start     Ordered   04/06/22 0000  Discharge wound care:       Comments: SHOWER DAILY Wash incision gently with soap and water.  [place] office with any drainage, redness, or firmness of the incision.   04/06/22 [facility]  04/06/2022 2:01 PM

## 2022-04-06 NOTE — Assessment & Plan Note (Signed)
Patient noted to be in the postpartum period 3 days after C-section with noted preeclampsia during her third trimester pregnancy Systolic blood pressures have been in the 140s to 160s Patient is currently on metoprolol 100 mg and Procardia 60 mg twice daily Initial plan is to increase metoprolol to 150 mg daily as well as continue Procardia 60 mg twice daily Will also continue Lasix 20 mg daily pending follow-up in the next 2 days Next step would be to add on low-dose hydralazine 25 mg twice daily Will otherwise continue to monitor alongside primary team pending discharge.

## 2022-04-06 NOTE — Progress Notes (Signed)
Pt discharged with infant.  Discharge instructions, prescriptions and follow up appointment given to and reviewed with pt. Pt verbalized understanding. Escorted out by staff. 

## 2022-04-06 NOTE — Progress Notes (Signed)
Progress Note - Cesarean Delivery  Patricia Mcbride is a 42 y.o. [phone] now PP day 4 s/p C-Section, Low Transverse .   Subjective:  Visit short as pt currently in SCN with her infant   Pt reports that she is tired. Patient reports Her pain is not well managed, is only taking Tylenol and Motrin (last given 3/15 at 1158). She is resistant to narcotics d/t fear of "being out of it:. Otherwise reports sh eis well. Reports she swelling is much improved now only notices swelling in  her feet.     Objective:  Vital signs in last 24 hours: Temp:  [97.9 F (36.6 C)-99 F (37.2 C)] 99 F (37.2 C) (03/16 0832) Pulse Rate:  [71-83] 78 (03/16 0832) Resp:  [18-20] 20 (03/16 0832) BP: (133-151)/(64-84) 146/78 (03/16 0832) SpO2:  [98 %-100 %] 100 % (03/16 [id number])  Physical Exam:  General: alert and cooperative Lochia: appropriate Uterine Fundus: not assessed Incision: not assessed Lungs: some advantageous breath sounds in left middle lobe otherwise clear Extremities: trace to +1 BLE.      Data Review No results for input(s): "HGB", "HCT" in the last 72 hours.  Assessment:  Principal Problem:   Pre-eclampsia during pregnancy in third trimester, antepartum Active Problems:   Pre-existing diabetes mellitus during pregnancy   Obesity affecting pregnancy   Anxiety and depression   Advanced maternal age in multigravida, third trimester   Gestational hypertension w/o significant proteinuria in 2nd trimester   [redacted] weeks gestation of pregnancy   Status post Cesarean section. Doing well postoperatively.   Hypertension better controlled but continuous to be mildly elevated  Plan:       This CNM spoke with the Hospitalist, Dr Romona Curls.  Metoprolol increased to 150mg  daily, will give now. If blood pressure WNL around noon ok to send home with close follow up. IF BP elevated, consult Dr Ernestina Patches. May consider echo if any concerns for cardiac involvement. May discontinue lasix if pt does not  require it based on clinical exam.   Reviewed pain management with pt, Motrin ordered. Encouraged use of narcotic if her pain is not well managed.   Will reassess around noon.   Patricia Mcbride, [facility]  04/06/2022 10:04 AM

## 2022-04-06 NOTE — Progress Notes (Signed)
I spoke with Dr Ernestina Patches at 1145, he recommended keeping pt on Lasix for a total of 5 days. If her BP at noon is WNL ok to send pt home on current medication with follow up in the office on Monday or Tuesday.   Pt's Blood pressure at 1244 138/83. Pt feels ready to go home. See discharge summary [place], [facility]  04/06/2022 2:04 PM

## 2022-04-08 ENCOUNTER — Ambulatory Visit (INDEPENDENT_AMBULATORY_CARE_PROVIDER_SITE_OTHER): Payer: Managed Care, Other (non HMO)

## 2022-04-08 ENCOUNTER — Other Ambulatory Visit: Payer: Self-pay | Admitting: Licensed Practical Nurse

## 2022-04-08 VITALS — BP 128/77 | HR 80 | Ht 62.0 in | Wt 245.0 lb

## 2022-04-08 DIAGNOSIS — Z0130 Encounter for examination of blood pressure without abnormal findings: Secondary | ICD-10-CM

## 2022-04-08 NOTE — Progress Notes (Signed)
NURSE VISIT NOTE  Subjective:    Patient ID: Patricia Mcbride, female    DOB: 1980/04/26, 42 y.o.   MRN: [id number]  HPI  Patient is a 42 y.o. [id number] female who presents for BP check per order from Hildred Laser, MD.   Patient reports compliance with prescribed BP medications: Last dose of BP medication:  this morning.   BP Readings from Last 3 Encounters:  04/08/22 128/77  04/06/22 138/83  03/28/22 (!) 175/90   Pulse Readings from Last 3 Encounters:  04/08/22 80  04/06/22 79  03/28/22 96    Objective:    BP 128/77   Pulse 80   Ht 5\' 2"  (1.575 m)   Wt 245 lb (111.1 kg)   BMI 44.81 kg/m   Assessment:   1. Blood pressure check      Plan:  Routing note to provider.   Loney Laurence, CMA

## 2022-04-10 ENCOUNTER — Other Ambulatory Visit: Payer: 59

## 2022-04-11 ENCOUNTER — Other Ambulatory Visit: Payer: 59

## 2022-04-12 ENCOUNTER — Telehealth: Payer: Self-pay

## 2022-04-12 NOTE — Telephone Encounter (Signed)
I called Patricia Mcbride and left voicemail asking when does she plan to return to work.

## 2022-04-17 ENCOUNTER — Encounter: Payer: Managed Care, Other (non HMO) | Admitting: Obstetrics and Gynecology

## 2022-04-17 ENCOUNTER — Other Ambulatory Visit: Payer: 59

## 2022-04-18 ENCOUNTER — Telehealth: Payer: Self-pay

## 2022-04-18 ENCOUNTER — Other Ambulatory Visit: Payer: 59

## 2022-04-18 NOTE — Telephone Encounter (Signed)
I called Patricia Mcbride and left voicemail to return [name], as I have a few questions to ask her.

## 2022-04-25 NOTE — Progress Notes (Signed)
BP 128/76   Pulse 98   Temp 98.3 F (36.8 C) (Other (Comment))   Resp 16   Ht 5\' 2"  (1.575 m)   Wt 246 lb 3.2 oz (111.7 kg)   SpO2 98%   BMI 45.03 kg/m    Subjective:    Patient ID: Patricia Mcbride, female    DOB: [birth year], 42 y.o.   MRN: [id number]  HPI: Patricia Mcbride is a 43 y.o. female  Chief Complaint  Patient presents with   Diabetes   Depression   Anxiety   Diabetes: her last A1C was 6.9 on 11/02/2021. She was on metformin and ozempic but became pregnant and had to stop the ozempic. During pregnancy she was started on toujeo she was taking in am 28 units, 34 units in pm.   She is currently taking metformin 500 mg in am and 500 mg pm. She says her blood sugar has been running 153.  She denies any polyuria, polydipsia or polyphagia. She has not been using the insulin because she was not sure what to take.  She would like to go back on the ozempic. Her A1C today is 6.6.   Depression:she is currently taking celexa 20 mg in the am and 20 mg in the pm.  She does have a daughter in the NICU and has a lot of stress.  But overall doing well.     04/26/2022    8:10 AM 03/07/2022    8:47 AM 01/30/2022   10:05 AM 01/24/2022    8:01 AM 12/07/2021    8:38 AM  Depression screen PHQ 2/9  Decreased Interest 0 0 0 0 0  Down, Depressed, Hopeless 0 0 0 0 0  PHQ - 2 Score 0 0 0 0 0  Altered sleeping 0 0 0 0 0  Tired, decreased energy 1 1 0 0 1  Change in appetite 0 0 0 0 0  Feeling bad or failure about yourself  0 0 0 0 0  Trouble concentrating 0 0 0 0 0  Moving slowly or fidgety/restless 0  0 0 0  Suicidal thoughts 0 0 0 0 0  PHQ-9 Score 1 1 0 0 1  Difficult doing work/chores  Not difficult at all   Not difficult at all       04/26/2022    8:11 AM 03/07/2022    8:48 AM 01/30/2022   10:05 AM 12/07/2021    8:38 AM  GAD 7 : Generalized Anxiety Score  Nervous, Anxious, on Edge 1 0 0 1  Control/stop worrying 1 0 0 0  Worry too much - different things 1 0 0 0  Trouble  relaxing 0 0 0 0  Restless 0 0 0 0  Easily annoyed or irritable 0 0 0 0  Afraid - awful might happen 0 0 0 0  Total GAD 7 Score 3 0 0 1  Anxiety Difficulty Not difficult at all Not difficult at all  Not difficult at all     Gerd: she is currently taking pepcid 20 mg daily. She is doing well.   Obesity:her weight today is 246 lbs with a BMI of 45.03. she is working on getting physical activity.  She is getting in some walks.  And she is restarting ozempic.   Hypertension during pregnancy:  she is currently on nifedipine 60 mg two times a day. She has follow up appointment with GYN 05/02/2022 and will discuss how long she will be on this and a tubal  ligation. Blood pressure today is 128/76. She says she has not had anymore lower extremity edema.   Relevant past medical, surgical, family and social history reviewed and updated as indicated. Interim medical history since our last visit reviewed. Allergies and medications reviewed and updated.  Review of Systems  Constitutional: Negative for fever or weight change.  Respiratory: Negative for cough and shortness of breath.   Cardiovascular: Negative for chest pain or palpitations.  Gastrointestinal: Negative for abdominal pain, no bowel changes.  Musculoskeletal: Negative for gait problem or joint swelling.  Skin: Negative for rash.  Neurological: Negative for dizziness or headache.  No other specific complaints in a complete review of systems (except as listed in HPI above).      Objective:    BP 128/76   Pulse 98   Temp 98.3 F (36.8 C) (Other (Comment))   Resp 16   Ht 5\' 2"  (1.575 m)   Wt 246 lb 3.2 oz (111.7 kg)   SpO2 98%   BMI 45.03 kg/m   Wt Readings from Last 3 Encounters:  04/26/22 246 lb 3.2 oz (111.7 kg)  04/08/22 245 lb (111.1 kg)  03/31/22 290 lb 6.4 oz (131.7 kg)    Physical Exam  Constitutional: Patient appears well-developed and well-nourished. Obese  No distress.  HEENT: head atraumatic, normocephalic,  pupils equal and reactive to light, neck supple Cardiovascular: Normal rate, regular rhythm and normal heart sounds.  No murmur heard. No BLE edema. Pulmonary/Chest: Effort normal and breath sounds normal. No respiratory distress. Abdominal: Soft.  There is no tenderness. Psychiatric: Patient has a normal mood and affect. behavior is normal. Judgment and thought content normal.  Results for orders placed or performed in visit on 04/26/22  POCT HgB A1C  Result Value Ref Range   Hemoglobin A1C 6.6 (A) 4.0 - 5.6 %   HbA1c POC (<> result, manual entry)     HbA1c, POC (prediabetic range)     HbA1c, POC (controlled diabetic range)        Assessment & Plan:   Problem List Items Addressed This Visit       Cardiovascular and Mediastinum   Postpartum hypertension    She is currently on nifedipine. She has follow up appointment with GYN 05/02/2022 and will discuss how long she will be on this and a tubal ligation. Blood pressure today is 128/76. She says she has not had anymore lower extremity edema.         Digestive   Gastroesophageal reflux disease without esophagitis     Endocrine   Type 2 diabetes mellitus with hyperglycemia, without long-term current use of insulin - Primary    Restart ozempic 0.25 mg weekly, stop toujeo continue metformin 500 mg BID.       Relevant Medications   metFORMIN (GLUCOPHAGE) 500 MG tablet   Semaglutide,0.25 or 0.5MG /DOS, (OZEMPIC, 0.25 OR 0.5 MG/DOSE,) 2 MG/3ML SOPN   Other Relevant Orders   POCT HgB A1C (Completed)     Other   Major depressive disorder, recurrent episode with anxious distress    Continue celexa 20 mg two times a day      Morbid obesity    Restart ozempic      Relevant Medications   metFORMIN (GLUCOPHAGE) 500 MG tablet   Semaglutide,0.25 or 0.5MG /DOS, (OZEMPIC, 0.25 OR 0.5 MG/DOSE,) 2 MG/3ML SOPN     Follow up plan: Return in about 4 months (around 08/26/2022) for follow up.

## 2022-04-26 ENCOUNTER — Other Ambulatory Visit: Payer: Self-pay

## 2022-04-26 ENCOUNTER — Ambulatory Visit (INDEPENDENT_AMBULATORY_CARE_PROVIDER_SITE_OTHER): Payer: 59 | Admitting: Nurse Practitioner

## 2022-04-26 ENCOUNTER — Encounter: Payer: Self-pay | Admitting: Nurse Practitioner

## 2022-04-26 VITALS — BP 128/76 | HR 98 | Temp 98.3°F | Resp 16 | Ht 62.0 in | Wt 246.2 lb

## 2022-04-26 DIAGNOSIS — E1165 Type 2 diabetes mellitus with hyperglycemia: Secondary | ICD-10-CM

## 2022-04-26 DIAGNOSIS — K219 Gastro-esophageal reflux disease without esophagitis: Secondary | ICD-10-CM | POA: Diagnosis not present

## 2022-04-26 DIAGNOSIS — O165 Unspecified maternal hypertension, complicating the puerperium: Secondary | ICD-10-CM

## 2022-04-26 DIAGNOSIS — F339 Major depressive disorder, recurrent, unspecified: Secondary | ICD-10-CM | POA: Diagnosis not present

## 2022-04-26 LAB — POCT GLYCOSYLATED HEMOGLOBIN (HGB A1C): Hemoglobin A1C: 6.6 % — AB (ref 4.0–5.6)

## 2022-04-26 MED ORDER — OZEMPIC (0.25 OR 0.5 MG/DOSE) 2 MG/3ML ~~LOC~~ SOPN: 0.2500 mg | PEN_INJECTOR | SUBCUTANEOUS | 0 refills | Status: DC

## 2022-04-26 MED ORDER — METFORMIN HCL 500 MG PO TABS: 500.0000 mg | ORAL_TABLET | Freq: Two times a day (BID) | ORAL | 3 refills | Status: DC

## 2022-04-26 NOTE — Assessment & Plan Note (Signed)
Restart ozempic 0.25 mg weekly, stop toujeo continue metformin 500 mg BID.

## 2022-04-26 NOTE — Assessment & Plan Note (Signed)
She is currently on nifedipine. She has follow up appointment with GYN 05/02/2022 and will discuss how long she will be on this and a tubal ligation. Blood pressure today is 128/76. She says she has not had anymore lower extremity edema.

## 2022-04-26 NOTE — Assessment & Plan Note (Signed)
Continue celexa 20 mg two times a day

## 2022-04-26 NOTE — Assessment & Plan Note (Signed)
Restart ozempic

## 2022-05-02 ENCOUNTER — Ambulatory Visit (INDEPENDENT_AMBULATORY_CARE_PROVIDER_SITE_OTHER): Payer: Managed Care, Other (non HMO) | Admitting: Obstetrics and Gynecology

## 2022-05-02 ENCOUNTER — Encounter: Payer: Self-pay | Admitting: Obstetrics and Gynecology

## 2022-05-02 VITALS — BP 119/79 | HR 96 | Ht 62.0 in | Wt 249.2 lb

## 2022-05-02 DIAGNOSIS — E1165 Type 2 diabetes mellitus with hyperglycemia: Secondary | ICD-10-CM

## 2022-05-02 DIAGNOSIS — Z9889 Other specified postprocedural states: Secondary | ICD-10-CM

## 2022-05-02 NOTE — Progress Notes (Signed)
HPI:      Patricia Mcbride is a 42 y.o. [phone] who LMP was No LMP recorded.  Subjective:   Patricia Mcbride presents today 4 weeks from cesarean delivery for severe preeclampsia at 31 weeks.  Her baby remains in the NICU but is doing well.  Patricia Mcbride is bottlefeeding. Patient reports a small knot at the edge of her incision but reports little pain and seems to be doing well postop. Patricia Mcbride is considering methods of birth control.    Hx: The following portions of the patient's history were reviewed and updated as appropriate:             Patricia Mcbride  has a past medical history of Depression, Diabetes, GERD (gastroesophageal reflux disease), and Jaundice of newborn. Patricia Mcbride does not have any pertinent problems on file. Patricia Mcbride  has a past surgical history that includes Esophagogastroduodenoscopy (egd) with propofol (N/A, 01/19/2019); Wisdom tooth extraction; and Cesarean section (N/A, 04/02/2022). Her family history includes Alzheimer's disease in her paternal grandfather and paternal grandmother; Asthma in her maternal grandmother and mother; Cancer (age of onset: 96) in her father; Dementia in her paternal grandfather and paternal grandmother; Depression in her father, mother, sister, and sister; Diabetes in her father and maternal grandmother; Hearing loss in her maternal grandmother and sister; Heart disease in her maternal grandmother, paternal grandfather, and paternal grandmother; Hyperlipidemia in her father, maternal grandmother, paternal grandfather, and paternal grandmother; Hypertension in her father, maternal grandmother, paternal grandfather, paternal grandmother, and sister; Kidney disease in her maternal grandmother, paternal grandfather, and paternal grandmother; Miscarriages / [place] in her sister. Patricia Mcbride  reports that Patricia Mcbride has quit smoking. Patricia Mcbride has never used smokeless tobacco. Patricia Mcbride reports that Patricia Mcbride does not currently use alcohol. Patricia Mcbride reports that Patricia Mcbride does not currently use drugs. Patricia Mcbride has a current medication list  which includes the following prescription(s): acetaminophen, citalopram, famotidine, gvoke hypopen 1-pack, metformin, nifedipine, multivitamin-prenatal, ozempic (0.25 or 0.5 mg/dose), and furosemide. Patricia Mcbride is allergic to morphine.       Review of Systems:  Review of Systems  Constitutional: Denied constitutional symptoms, night sweats, recent illness, fatigue, fever, insomnia and weight loss.  Eyes: Denied eye symptoms, eye pain, photophobia, vision change and visual disturbance.  Ears/Nose/Throat/Neck: Denied ear, nose, throat or neck symptoms, hearing loss, nasal discharge, sinus congestion and sore throat.  Cardiovascular: Denied cardiovascular symptoms, arrhythmia, chest pain/pressure, edema, exercise intolerance, orthopnea and palpitations.  Respiratory: Denied pulmonary symptoms, asthma, pleuritic pain, productive sputum, cough, dyspnea and wheezing.  Gastrointestinal: Denied, gastro-esophageal reflux, melena, nausea and vomiting.  Genitourinary: Denied genitourinary symptoms including symptomatic vaginal discharge, pelvic relaxation issues, and urinary complaints.  Musculoskeletal: Denied musculoskeletal symptoms, stiffness, swelling, muscle weakness and myalgia.  Dermatologic: Denied dermatology symptoms, rash and scar.  Neurologic: Denied neurology symptoms, dizziness, headache, neck pain and syncope.  Psychiatric: Denied psychiatric symptoms, anxiety and depression.  Endocrine: Denied endocrine symptoms including hot flashes and night sweats.   Meds:   Current Outpatient Medications on File Prior to Visit  Medication Sig Dispense Refill   acetaminophen (TYLENOL) 500 MG tablet Take 2 tablets (1,000 mg total) by mouth every 6 (six) hours as needed for mild pain. 30 tablet 0   citalopram (CELEXA) 20 MG tablet Take 2 tablets (40 mg total) by mouth daily. 30 tablet 12   famotidine (PEPCID) 20 MG tablet Take 20 mg by mouth daily.     Glucagon (GVOKE HYPOPEN 1-PACK) 1 MG/0.2ML SOAJ Inject  1 mg into the skin as needed (for hypoglycemin). 0.2 mL 3  metFORMIN (GLUCOPHAGE) 500 MG tablet Take 1 tablet (500 mg total) by mouth 2 (two) times daily with a meal. 180 tablet 3   NIFEdipine (ADALAT CC) 60 MG 24 hr tablet Take 1 tablet (60 mg total) by mouth 2 (two) times daily. 60 tablet 2   Prenatal Vit-Fe Fumarate-FA (MULTIVITAMIN-PRENATAL) 27-0.8 MG TABS tablet Take 1 tablet by mouth daily at 12 noon.     Semaglutide,0.25 or 0.5MG /DOS, (OZEMPIC, 0.25 OR 0.5 MG/DOSE,) 2 MG/3ML SOPN Inject 0.25 mg into the skin once a week. 3 mL 0   furosemide (LASIX) 20 MG tablet Take 1 tablet (20 mg total) by mouth daily for 3 days. 3 tablet 0   No current facility-administered medications on file prior to visit.      Objective:     Vitals:   05/02/22 0906  BP: 119/79  Pulse: 96   Filed Weights   05/02/22 0906  Weight: 249 lb 3.2 oz (113 kg)               Abdomen: Soft.  Non-tender.  Small firm area left side of the incision likely consistent with fluid collection.  No HSM.  Incision/s: Intact.  Healing well.  No erythema.  No drainage.             Assessment:    [id number] Patient Active Problem List   Diagnosis Date Noted   Postpartum hypertension 04/06/2022   [redacted] weeks gestation of pregnancy 04/01/2022   Pre-eclampsia during pregnancy in third trimester, antepartum 03/30/2022   Labor and delivery, indication for care 03/29/2022   Advanced maternal age in multigravida, third trimester 03/28/2022   Hypertension in pregnancy, antepartum 03/28/2022   Gestational hypertension, third trimester 03/07/2022   Hypertension affecting pregnancy 02/28/2022   Gestational hypertension w/o significant proteinuria in 2nd trimester 01/30/2022   Pre-existing diabetes mellitus during pregnancy 12/02/2021   Obesity affecting pregnancy 12/02/2021   Anxiety and depression 12/02/2021   Pregnancy, supervision, high-risk 10/24/2021   Gastroesophageal reflux disease without esophagitis 10/03/2021   Type  2 diabetes mellitus with hyperglycemia, without long-term current use of insulin 05/28/2021   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 Postoperative state   2. Postpartum care following cesarean delivery   3. Type 2 diabetes mellitus with hyperglycemia, without long-term current use of insulin     Patient doing well.  Baby doing well but still in NICU.   Plan:            1.  May resume normal activities with exception of heavy lifting.  2.  Discussed multiple forms of birth control including permanent sterilization.  Patient is strongly considering IUD for birth control and would like to schedule this with her menses. Orders No orders of the defined types were placed in this encounter.   No orders of the defined types were placed in this encounter.     F/U  Return for Patricia Mcbride is to [place] at the start of next menses.  Elonda Husky, M.D. 05/02/2022 9:33 AM

## 2022-05-02 NOTE — Progress Notes (Signed)
Patient presents today for 4 week postpartum follow-up. Patient had a cesarean delivery on 04/02/22. She states baby Asher Muir is still in the NICU but hopes to have her come home soon once she masters using a bottle.  She is using formula for bottle feeding. She states she would like BTL for birth control. EPDS score of 4. She states continuing to check her sugars, reports highest last week was 132, recently has restarted ozempic. She states no other questions or concerns at this time.

## 2022-05-06 ENCOUNTER — Telehealth: Payer: Self-pay

## 2022-05-06 NOTE — Telephone Encounter (Signed)
[facility]- Discharge Bahena Backs-Left pt a VM about the following below. 1-Do you have any questions or concerns about yourself as you heal? 2-Any concerns or questions about your baby? 3-How was your stay at the hospital? 4-How did our team work together to care for you? You should be receiving a survey in the mail soon.   We would really appreciate it if you could fill that out for us and return it in the mail.  We value the feedback to make improvements and continue the great work we do.   If you have any questions please feel free to Fallen me back at [phone]  

## 2022-05-28 ENCOUNTER — Other Ambulatory Visit: Payer: Self-pay | Admitting: Nurse Practitioner

## 2022-05-28 DIAGNOSIS — E1165 Type 2 diabetes mellitus with hyperglycemia: Secondary | ICD-10-CM

## 2022-05-28 NOTE — Telephone Encounter (Signed)
Requested Prescriptions  Pending Prescriptions Disp Refills   OZEMPIC, 0.25 OR 0.5 MG/DOSE, 2 MG/3ML SOPN [Pharmacy Med Name: OZEMPIC 0.25 OR 0.5MG /DOS1X2MG  3ML] 3 mL 0    Sig: INJECT 0.25 MG UNDER THE SKIN ONCE A WEEK     Endocrinology:  Diabetes - GLP-1 Receptor Agonists - semaglutide Failed - 05/28/2022  9:06 AM      Failed - HBA1C in normal range and within 180 days    Hemoglobin A1C  Date Value Ref Range Status  04/26/2022 6.6 (A) 4.0 - 5.6 % Final   Hgb A1c MFr Bld  Date Value Ref Range Status  11/02/2021 6.9 (H) 4.8 - 5.6 % Final    Comment:             Prediabetes: 5.7 - 6.4          Diabetes: >6.4          Glycemic control for adults with diabetes: <7.0          Passed - Cr in normal range and within 360 days    Creat  Date Value Ref Range Status  04/12/2021 0.64 0.50 - 0.97 mg/dL Final   Creatinine, Ser  Date Value Ref Range Status  04/01/2022 0.68 0.44 - 1.00 mg/dL Final   Creatinine, Urine  Date Value Ref Range Status  03/28/2022 229 mg/dL Final         Passed - Valid encounter within last 6 months    Recent Outpatient Visits           1 month ago Type 2 diabetes mellitus with hyperglycemia, without long-term current use of insulin [facility])   [place] [facility] Berniece Salines, FNP   7 months ago Positive pregnancy test   [facility] Berniece Salines, FNP   9 months ago Major depressive disorder, recurrent episode with anxious distress [facility])   [facility] Berniece Salines, FNP   11 months ago uncontrolled Type 2 diabetes mellitus with hyperglycemia, without long-term current use of insulin [facility])   [place] [facility] Berniece Salines, FNP   1 year ago uncontrolled Type 2 diabetes mellitus with hyperglycemia, without long-term current use of insulin [facility])    [facility] Berniece Salines, FNP       Future Appointments             In 3 months  Zane Herald, Rudolpho Sevin, FNP [facility], [facility]

## 2022-07-16 ENCOUNTER — Other Ambulatory Visit: Payer: Self-pay | Admitting: Licensed Practical Nurse

## 2022-07-16 ENCOUNTER — Encounter: Payer: Self-pay | Admitting: Nurse Practitioner

## 2022-07-16 ENCOUNTER — Other Ambulatory Visit: Payer: Self-pay | Admitting: Nurse Practitioner

## 2022-07-16 DIAGNOSIS — E1165 Type 2 diabetes mellitus with hyperglycemia: Secondary | ICD-10-CM

## 2022-07-17 MED ORDER — FUROSEMIDE 20 MG PO TABS: 20.0000 mg | ORAL_TABLET | Freq: Every day | ORAL | 0 refills | Status: DC

## 2022-07-25 ENCOUNTER — Other Ambulatory Visit: Payer: Self-pay | Admitting: Nurse Practitioner

## 2022-07-26 ENCOUNTER — Other Ambulatory Visit: Payer: Self-pay | Admitting: Nurse Practitioner

## 2022-07-26 NOTE — Telephone Encounter (Signed)
Requested by interface surescripts. Medication expired. Future visit in 1 month . Requested Prescriptions  Refused Prescriptions Disp Refills   furosemide (LASIX) 20 MG tablet [Pharmacy Med Name: FUROSEMIDE 20MG  TABLETS] 3 tablet 0    Sig: TAKE 1 TABLET(20 MG) BY MOUTH DAILY FOR 3 DAYS     Cardiovascular:  Diuretics - Loop Failed - 07/26/2022  3:39 AM      Failed - Ca in normal range and within 180 days    Calcium  Date Value Ref Range Status  04/01/2022 8.1 (L) 8.9 - 10.3 mg/dL Final         Failed - Na in normal range and within 180 days    Sodium  Date Value Ref Range Status  04/01/2022 134 (L) 135 - 145 mmol/L Final         Failed - Mg Level in normal range and within 180 days    Magnesium  Date Value Ref Range Status  04/02/2022 4.9 (H) 1.7 - 2.4 mg/dL Final    Comment:    Performed at [facility], [address] [place], [place] [address]         Passed - K in normal range and within 180 days    Potassium  Date Value Ref Range Status  04/01/2022 3.9 3.5 - 5.1 mmol/L Final         Passed - Cr in normal range and within 180 days    Creat  Date Value Ref Range Status  04/12/2021 0.64 0.50 - 0.97 mg/dL Final   Creatinine, Ser  Date Value Ref Range Status  04/01/2022 0.68 0.44 - 1.00 mg/dL Final   Creatinine, Urine  Date Value Ref Range Status  03/28/2022 229 mg/dL Final         Passed - Cl in normal range and within 180 days    Chloride  Date Value Ref Range Status  04/01/2022 106 98 - 111 mmol/L Final         Passed - Last BP in normal range    BP Readings from Last 1 Encounters:  05/02/22 119/79         Passed - Valid encounter within last 6 months    Recent Outpatient Visits           3 months ago Type 2 diabetes mellitus with hyperglycemia, without long-term current use of insulin [facility])   [facility] Berniece Salines, FNP   9 months ago Positive pregnancy test   [facility] Berniece Salines, FNP   11 months ago Major depressive disorder, recurrent episode with anxious distress [facility])   [place] [facility] Berniece Salines, FNP   1 year ago uncontrolled Type 2 diabetes mellitus with hyperglycemia, without long-term current use of insulin [facility])    [facility] [name] F, FNP   1 year ago uncontrolled Type 2 diabetes mellitus with hyperglycemia, without long-term current use of insulin [facility])   [facility] Berniece Salines, FNP       Future Appointments             In 1 month Zane Herald, Rudolpho Sevin, FNP [facility], [facility]

## 2022-07-26 NOTE — Telephone Encounter (Signed)
Pt stated she did not need it, she had already picked up her 3 day supply

## 2022-07-26 NOTE — Telephone Encounter (Signed)
Requested medication (s) are due for refill today:- Requested medication (s) are on the active medication list: (expired)  Last refill:  07/17/22 #3  Future visit scheduled: yes  Notes to clinic:  this med ended 07/20/22   Requested Prescriptions  Pending Prescriptions Disp Refills   furosemide (LASIX) 20 MG tablet [Pharmacy Med Name: FUROSEMIDE 20MG  TABLETS] 3 tablet 0    Sig: TAKE 1 TABLET(20 MG) BY MOUTH DAILY FOR 3 DAYS     Cardiovascular:  Diuretics - Loop Failed - 07/25/2022  3:39 AM      Failed - Ca in normal range and within 180 days    Calcium  Date Value Ref Range Status  04/01/2022 8.1 (L) 8.9 - 10.3 mg/dL Final         Failed - Na in normal range and within 180 days    Sodium  Date Value Ref Range Status  04/01/2022 134 (L) 135 - 145 mmol/L Final         Failed - Mg Level in normal range and within 180 days    Magnesium  Date Value Ref Range Status  04/02/2022 4.9 (H) 1.7 - 2.4 mg/dL Final    Comment:    Performed at [facility], [address] [place], [place] [address]         Passed - K in normal range and within 180 days    Potassium  Date Value Ref Range Status  04/01/2022 3.9 3.5 - 5.1 mmol/L Final         Passed - Cr in normal range and within 180 days    Creat  Date Value Ref Range Status  04/12/2021 0.64 0.50 - 0.97 mg/dL Final   Creatinine, Ser  Date Value Ref Range Status  04/01/2022 0.68 0.44 - 1.00 mg/dL Final   Creatinine, Urine  Date Value Ref Range Status  03/28/2022 229 mg/dL Final         Passed - Cl in normal range and within 180 days    Chloride  Date Value Ref Range Status  04/01/2022 106 98 - 111 mmol/L Final         Passed - Last BP in normal range    BP Readings from Last 1 Encounters:  05/02/22 119/79         Passed - Valid encounter within last 6 months    Recent Outpatient Visits           3 months ago Type 2 diabetes mellitus with hyperglycemia, without long-term current use of insulin [facility])    [facility] Berniece Salines, FNP   9 months ago Positive pregnancy test   [facility] Berniece Salines, FNP   11 months ago Major depressive disorder, recurrent episode with anxious distress [facility])   [facility] Berniece Salines, FNP   1 year ago uncontrolled Type 2 diabetes mellitus with hyperglycemia, without long-term current use of insulin [facility])   [facility] Berniece Salines, FNP   1 year ago uncontrolled Type 2 diabetes mellitus with hyperglycemia, without long-term current use of insulin [facility])    [facility] Berniece Salines, FNP       Future Appointments             In 1 month Zane Herald, Rudolpho Sevin, FNP [facility], [facility]

## 2022-08-23 ENCOUNTER — Telehealth: Payer: Self-pay

## 2022-08-25 NOTE — Progress Notes (Unsigned)
There were no vitals taken for this visit.   Subjective:    Patient ID: Patricia Mcbride, female    DOB: 09-Jan-1981, 42 y.o.   MRN: [id number]  HPI: Patricia Mcbride is a 42 y.o. female  No chief complaint on file.  Diabetes, Type 2:  -Last A1c 6.6 -Medications: metformin 500mg  BID, ozempic -Patient is compliant with the above medications and reports no side effects. *** -Checking BG at home: *** -Fasting home BG: *** -Post-prandial home BG: *** -Highest home BG since last visit: *** -Lowest home BG since last visit: *** -Diet: *** -Exercise: *** -Eye exam: due -Foot exam: due -Microalbumin: utd -Statin: no -PNA vaccine: none -Denies symptoms of hypoglycemia, polyuria, polydipsia, numbness extremities, foot ulcers/trauma. ***   Depression: last appointment patient reported that her daughter was in the NICU and that was causing some stress but overall she was doing well.  Patient reports today *** Medication celexa 20 mg in am and 20 mg in pm.  Compliant *** Side effects *** PHQ9 *** GAD ***      04/26/2022    8:10 AM 03/07/2022    8:47 AM 01/30/2022   10:05 AM 01/24/2022    8:01 AM 12/07/2021    8:38 AM  Depression screen PHQ 2/9  Decreased Interest 0 0 0 0 0  Down, Depressed, Hopeless 0 0 0 0 0  PHQ - 2 Score 0 0 0 0 0  Altered sleeping 0 0 0 0 0  Tired, decreased energy 1 1 0 0 1  Change in appetite 0 0 0 0 0  Feeling bad or failure about yourself  0 0 0 0 0  Trouble concentrating 0 0 0 0 0  Moving slowly or fidgety/restless 0  0 0 0  Suicidal thoughts 0 0 0 0 0  PHQ-9 Score 1 1 0 0 1  Difficult doing work/chores  Not difficult at all   Not difficult at all       04/26/2022    8:11 AM 03/07/2022    8:48 AM 01/30/2022   10:05 AM 12/07/2021    8:38 AM  GAD 7 : Generalized Anxiety Score  Nervous, Anxious, on Edge 1 0 0 1  Control/stop worrying 1 0 0 0  Worry too much - different things 1 0 0 0  Trouble relaxing 0 0 0 0  Restless 0 0 0 0  Easily  annoyed or irritable 0 0 0 0  Afraid - awful might happen 0 0 0 0  Total GAD 7 Score 3 0 0 1  Anxiety Difficulty Not difficult at all Not difficult at all  Not difficult at all    GERD GERD control status: {Blank single:19197::"controlled","uncontrolled","better","worse","exacerbated","stable"}Satisfied with current treatment? {Blank single:19197::"yes","no"} Heartburn frequency:  Medication side effects: {Blank single:19197::"yes","no"}  Medication compliance: {Blank multiple:19196::"better","worse","stable","fluctuating"}  GERD medications:pepcid 20 mg daily Antacid use frequency:   Duration:  Nature:  Location:  Heartburn duration:  Alleviatiating factors:   Aggravating factors:  Dysphagia: {Blank single:19197::"yes","no"} Odynophagia:  {Blank single:19197::"yes","no"} Hematemesis: {Blank single:19197::"yes","no"} Blood in stool: {Blank single:19197::"yes","no"} EGD: {Blank single:19197::"yes","no"}    Obesity:  Current weight : *** BMI: *** previous weight:246 lbs Treatment Tried: lifestyle modification, currently on ozempic Comorbidities: DM, depression, GERD   Hypertension: she had HTN during pregnancy  -Medications: nifedipine 60mg  two times a day -Patient is compliant with above medications and reports no side effects. -Checking BP at home (average): *** -Highest BP at home: *** -Lowest BP at home: *** -Denies any SOB, CP,  vision changes, LE edema or symptoms of hypotension -Diet: *** -Exercise: ***    Relevant past medical, surgical, family and social history reviewed and updated as indicated. Interim medical history since our last visit reviewed. Allergies and medications reviewed and updated.  Review of Systems  Constitutional: Negative for fever or weight change.  Respiratory: Negative for cough and shortness of breath.   Cardiovascular: Negative for chest pain or palpitations.  Gastrointestinal: Negative for abdominal pain, no bowel changes.   Musculoskeletal: Negative for gait problem or joint swelling.  Skin: Negative for rash.  Neurological: Negative for dizziness or headache.  No other specific complaints in a complete review of systems (except as listed in HPI above).      Objective:    There were no vitals taken for this visit.  Wt Readings from Last 3 Encounters:  05/02/22 249 lb 3.2 oz (113 kg)  04/26/22 246 lb 3.2 oz (111.7 kg)  04/08/22 245 lb (111.1 kg)    Physical Exam  Constitutional: Patient appears well-developed and well-nourished. Obese  No distress.  HEENT: head atraumatic, normocephalic, pupils equal and reactive to light, neck supple Cardiovascular: Normal rate, regular rhythm and normal heart sounds.  No murmur heard. No BLE edema. Pulmonary/Chest: Effort normal and breath sounds normal. No respiratory distress. Abdominal: Soft.  There is no tenderness. Psychiatric: Patient has a normal mood and affect. behavior is normal. Judgment and thought content normal.  Results for orders placed or performed in visit on 04/26/22  POCT HgB A1C  Result Value Ref Range   Hemoglobin A1C 6.6 (A) 4.0 - 5.6 %   HbA1c POC (<> result, manual entry)     HbA1c, POC (prediabetic range)     HbA1c, POC (controlled diabetic range)        Assessment & Plan:   Problem List Items Addressed This Visit   None     Follow up plan: No follow-ups on file.

## 2022-08-26 ENCOUNTER — Ambulatory Visit: Payer: Medicaid Other | Admitting: Nurse Practitioner

## 2022-08-26 ENCOUNTER — Encounter: Payer: Self-pay | Admitting: Nurse Practitioner

## 2022-08-26 ENCOUNTER — Other Ambulatory Visit: Payer: Self-pay

## 2022-08-26 VITALS — BP 122/74 | HR 98 | Temp 98.2°F | Resp 16 | Ht 62.0 in | Wt 250.7 lb

## 2022-08-26 DIAGNOSIS — Z7984 Long term (current) use of oral hypoglycemic drugs: Secondary | ICD-10-CM

## 2022-08-26 DIAGNOSIS — Z130 Encounter for screening for diseases of the blood and blood-forming organs and certain disorders involving the immune mechanism: Secondary | ICD-10-CM | POA: Diagnosis not present

## 2022-08-26 DIAGNOSIS — Z13220 Encounter for screening for lipoid disorders: Secondary | ICD-10-CM | POA: Diagnosis not present

## 2022-08-26 DIAGNOSIS — F330 Major depressive disorder, recurrent, mild: Secondary | ICD-10-CM

## 2022-08-26 DIAGNOSIS — Z3009 Encounter for other general counseling and advice on contraception: Secondary | ICD-10-CM

## 2022-08-26 DIAGNOSIS — E1165 Type 2 diabetes mellitus with hyperglycemia: Secondary | ICD-10-CM

## 2022-08-26 DIAGNOSIS — K219 Gastro-esophageal reflux disease without esophagitis: Secondary | ICD-10-CM | POA: Diagnosis not present

## 2022-08-26 DIAGNOSIS — E782 Mixed hyperlipidemia: Secondary | ICD-10-CM | POA: Diagnosis not present

## 2022-08-26 DIAGNOSIS — Z7985 Long-term (current) use of injectable non-insulin antidiabetic drugs: Secondary | ICD-10-CM | POA: Diagnosis not present

## 2022-08-26 LAB — CBC WITH DIFFERENTIAL/PLATELET
Absolute Monocytes: 647 cells/uL (ref 200–950)
Basophils Absolute: 74 cells/uL (ref 0–200)
Basophils Relative: 0.7 %
Eosinophils Absolute: 339 cells/uL (ref 15–500)
Eosinophils Relative: 3.2 %
HCT: 38.5 % (ref 35.0–45.0)
Hemoglobin: 12.5 g/dL (ref 11.7–15.5)
Lymphs Abs: 3721 cells/uL (ref 850–3900)
MCH: 26.5 pg — ABNORMAL LOW (ref 27.0–33.0)
MCHC: 32.5 g/dL (ref 32.0–36.0)
MCV: 81.6 fL (ref 80.0–100.0)
MPV: 10.5 fL (ref 7.5–12.5)
Monocytes Relative: 6.1 %
Neutro Abs: 5819 cells/uL (ref 1500–7800)
Neutrophils Relative %: 54.9 %
Platelets: 397 10*3/uL (ref 140–400)
RBC: 4.72 10*6/uL (ref 3.80–5.10)
RDW: 13.7 % (ref 11.0–15.0)
Total Lymphocyte: 35.1 %
WBC: 10.6 10*3/uL (ref 3.8–10.8)

## 2022-08-26 MED ORDER — CITALOPRAM HYDROBROMIDE 20 MG PO TABS: 40.0000 mg | ORAL_TABLET | Freq: Two times a day (BID) | ORAL | 1 refills | Status: DC

## 2022-08-26 MED ORDER — SEMAGLUTIDE (1 MG/DOSE) 4 MG/3ML ~~LOC~~ SOPN: 1.0000 mg | PEN_INJECTOR | SUBCUTANEOUS | 0 refills | Status: DC

## 2022-08-26 NOTE — Telephone Encounter (Signed)
Called pt, no answer, LVMTRC. 

## 2022-08-26 NOTE — Assessment & Plan Note (Signed)
Continue pepcid 20 mg daily.

## 2022-08-26 NOTE — Assessment & Plan Note (Signed)
Working on lifestyle modification on ozempic

## 2022-08-26 NOTE — Assessment & Plan Note (Signed)
Continue metfromin 500 mg BID and increase ozempic to 1 mg weekly

## 2022-08-26 NOTE — Assessment & Plan Note (Signed)
Continue celexa 20 mg BID

## 2022-08-26 NOTE — Assessment & Plan Note (Signed)
Getting labs 

## 2022-08-27 ENCOUNTER — Other Ambulatory Visit: Payer: Self-pay | Admitting: Nurse Practitioner

## 2022-08-27 DIAGNOSIS — F330 Major depressive disorder, recurrent, mild: Secondary | ICD-10-CM

## 2022-08-27 MED ORDER — CITALOPRAM HYDROBROMIDE 20 MG PO TABS: 20.0000 mg | ORAL_TABLET | Freq: Two times a day (BID) | ORAL | 1 refills | Status: DC

## 2022-08-27 NOTE — Telephone Encounter (Signed)
The patient to returning missed [place]. Please advise?

## 2022-08-27 NOTE — Telephone Encounter (Signed)
Pt called back and said her PCP is managing this Rx for her. She now has medicaid and she is using Psych[facility], [facility]. Pls disregard this request.

## 2022-08-28 ENCOUNTER — Encounter: Payer: Self-pay | Admitting: Nurse Practitioner

## 2022-09-02 ENCOUNTER — Other Ambulatory Visit: Payer: Self-pay | Admitting: Nurse Practitioner

## 2022-09-02 DIAGNOSIS — E1165 Type 2 diabetes mellitus with hyperglycemia: Secondary | ICD-10-CM

## 2022-09-02 MED ORDER — SEMAGLUTIDE (1 MG/DOSE) 4 MG/3ML ~~LOC~~ SOPN: 1.0000 mg | PEN_INJECTOR | SUBCUTANEOUS | 0 refills | Status: DC

## 2022-09-19 ENCOUNTER — Other Ambulatory Visit: Payer: Self-pay | Admitting: Obstetrics

## 2022-09-19 ENCOUNTER — Telehealth: Payer: Self-pay | Admitting: Nurse Practitioner

## 2022-09-19 DIAGNOSIS — F330 Major depressive disorder, recurrent, mild: Secondary | ICD-10-CM

## 2022-09-19 NOTE — Telephone Encounter (Signed)
Darl Pikes called from [facility] and need to clarify the orders onthe script for citalopram (CELEXA) 20 MG tablet. Please f/u with pharmacy

## 2022-09-19 NOTE — Telephone Encounter (Signed)
Pharmacy notified.

## 2022-09-19 NOTE — Telephone Encounter (Signed)
Please clarify

## 2022-09-19 NOTE — Telephone Encounter (Signed)
[employer], spoke with Susan,RPh. She states that she needs clarification on the dosage on Celexa 20 mg. Current dose is 20 mg twice a day, Darl Pikes states that is more that patient has taken. Routing for review.

## 2022-09-20 ENCOUNTER — Other Ambulatory Visit: Payer: Self-pay | Admitting: Nurse Practitioner

## 2022-09-20 DIAGNOSIS — F330 Major depressive disorder, recurrent, mild: Secondary | ICD-10-CM

## 2022-09-20 MED ORDER — CITALOPRAM HYDROBROMIDE 20 MG PO TABS: 20.0000 mg | ORAL_TABLET | Freq: Two times a day (BID) | ORAL | 3 refills | Status: DC

## 2022-09-20 NOTE — Telephone Encounter (Signed)
Please send script to [facility]

## 2022-09-20 NOTE — Telephone Encounter (Signed)
Pt is calling Myriam Jacobson back Please advise CB- [phone]

## 2022-10-01 NOTE — Progress Notes (Unsigned)
GYNECOLOGY PROGRESS NOTE  Subjective:    Patient ID: Patricia Mcbride, female    DOB: 04-Oct-1980, 42 y.o.   MRN: [id number]  HPI  Patient is a 42 y.o. [id number] female who presents for consultation for tubal ligation. Notes that after having a very difficult pregnancy and her age, she and her partner have decided that no longer desire future fertility.   The following portions of the patient's history were reviewed and updated as appropriate: allergies, current medications, past family history, past medical history, past social history, past surgical history, and problem list.  Review of Systems Pertinent items noted in HPI and remainder of comprehensive ROS otherwise negative.   Objective:   Blood pressure 128/80, pulse 84, resp. rate 16, height 5\' 2"  (1.575 m), weight 251 lb 8 oz (114.1 kg), not currently breastfeeding. There is no height or weight on file to calculate BMI. General appearance: alert and no distress See H&P for remainder of exam.    Assessment:     1. Unwanted fertility   2. Increased BMI   3. Type 2 diabetes mellitus with hyperglycemia, without long-term current use of insulin (HCC)    Plan:   1. Unwanted fertility - Patient desires permanent sterilization.  Other reversible forms of contraception were discussed with patient; she declines all other modalities. Risks of procedure discussed with patient including but not limited to: risk of regret, permanence of method, bleeding, infection, injury to surrounding organs and need for additional procedures.  Failure risk of about 1% with increased risk of ectopic gestation if pregnancy occurs was also discussed with patient.  Also discussed possibility of post-tubal pain syndrome. Patient verbalized understanding of these risks and wants to proceed with sterilization.  Medicaid tubal sterilization forms completed.  Will need to have schedule surgery 30 days from this date.  Patient would like to have surgery on  11/04/2022.  Will schedule.  2. Increased BMI -BMI currently 46.  Plan to perform surgery laparoscopically.   3. Type 2 diabetes mellitus with hyperglycemia, without long-term current use of insulin (HCC) -Currently controlled on current medications metformin 500 mg twice daily, Ozempic 0.5 mg weekly.  Last A1c was 6.6.  No increased risk with minor surgery.   A total of 20 minutes were spent face-to-face with the patient during this encounter and over half of that time involved counseling and coordination of care.    Hildred Laser, MD [facility]

## 2022-10-02 ENCOUNTER — Encounter: Payer: Self-pay | Admitting: Obstetrics and Gynecology

## 2022-10-02 ENCOUNTER — Ambulatory Visit (INDEPENDENT_AMBULATORY_CARE_PROVIDER_SITE_OTHER): Payer: Medicaid Other | Admitting: Obstetrics and Gynecology

## 2022-10-02 VITALS — BP 128/80 | HR 84 | Resp 16 | Ht 62.0 in | Wt 251.5 lb

## 2022-10-02 DIAGNOSIS — Z3009 Encounter for other general counseling and advice on contraception: Secondary | ICD-10-CM

## 2022-10-02 DIAGNOSIS — Z7984 Long term (current) use of oral hypoglycemic drugs: Secondary | ICD-10-CM | POA: Diagnosis not present

## 2022-10-02 DIAGNOSIS — E1165 Type 2 diabetes mellitus with hyperglycemia: Secondary | ICD-10-CM | POA: Diagnosis not present

## 2022-10-02 DIAGNOSIS — R638 Other symptoms and signs concerning food and fluid intake: Secondary | ICD-10-CM | POA: Diagnosis not present

## 2022-10-02 NOTE — Patient Instructions (Signed)
GYNECOLOGY PRE-OPERATIVE INSTRUCTIONS  You are scheduled for surgery on 11/04/2022.  The name of your procedure is: Laparoscopic Bilateral Tubal Ligation.   Please read through these instructions carefully regarding preparation for your surgery: Nothing to eat after midnight on the day prior to surgery.  Do not take any medications unless recommended by your provider on day prior to surgery.  Do not take NSAIDs (Motrin, Aleve) or aspirin 7 days prior to surgery.  You may take Tylenol products for minor aches and pains.  You will receive a prescription for pain medications post-operatively.  You will be contacted by phone approximately 1-2 weeks prior to surgery to schedule your pre-operative appointment.  You will need someone to bring you to and from your surgery.  You will not be allowed to drive on the day of your surgery.   Please [place] the office if you have any questions regarding your upcoming surgery.    Thank you for choosing [facility] at [facility].      Laparoscopic Tubal Ligation Laparoscopic tubal ligation is a procedure to close the fallopian tubes. This is done to prevent pregnancy. When the fallopian tubes are closed, the eggs that your ovaries release cannot enter the uterus, and sperm cannot reach the released eggs. You should not have this procedure if you want to get pregnant someday or if you are unsure about having more children. Tell a health care provider about: Any allergies you have. All medicines you are taking, including vitamins, herbs, eye drops, creams, and over-the-counter medicines. Any problems you or family members have had with anesthetic medicines. Any blood disorders you have. Any surgeries you have had. Any medical conditions you have. Whether you are pregnant or may be pregnant. Any past pregnancies. What are the risks? Generally, this is a safe procedure. However, problems may occur, including: Infection. Bleeding. Injury to other  organs in the abdomen. Side effects from anesthetic medicines. Failure of the procedure. This procedure can increase your risk of an ectopic pregnancy. This is a pregnancy in which a fertilized egg attaches to the outside of the uterus. What happens before the procedure? Staying hydrated Follow instructions from your health care provider about hydration, which may include: Up to 2 hours before the procedure - you may continue to drink clear liquids, such as water, clear fruit juice, black coffee, and plain tea. Eating and drinking restrictions Follow instructions from your health care provider about eating and drinking, which may include: 8 hours before the procedure - stop eating heavy meals or foods, such as meat, fried foods, or fatty foods. 6 hours before the procedure - stop eating light meals or foods, such as toast or cereal. 6 hours before the procedure - stop drinking milk or drinks that contain milk. 2 hours before the procedure - stop drinking clear liquids. Medicines Ask your health care provider about: Changing or stopping your regular medicines. This is especially important if you are taking diabetes medicines or blood thinners. Taking medicines such as aspirin and ibuprofen. These medicines can thin your blood. Do not take these medicines unless your health care provider tells you to take them. Taking over-the-counter medicines, vitamins, herbs, and supplements. Surgery safety Ask your health care provider: How your surgery site will be marked. What steps will be taken to help prevent infection. These steps may include: Removing hair at the surgery site. Washing skin with a germ-killing soap. Taking antibiotic medicine. General instructions Do not use any products that contain nicotine or tobacco for at  least 4 weeks before the procedure. These products include cigarettes, chewing tobacco, and vaping devices, such as e-cigarettes. If you need help quitting, ask your health  care provider. Plan to have someone take you home from the hospital. If you will be going home right after the procedure, plan to have a responsible adult care for you for the time you are told. This is important. What happens during the procedure?     An IV will be inserted into one of your veins. You will be given one or more of the following: A medicine to help you relax (sedative). A medicine to numb the area (local anesthetic). A medicine to make you fall asleep (general anesthetic). A medicine that is injected into an area of your body to numb everything below the injection site (regional anesthetic). Your bladder may be emptied with a small tube (catheter). If you have been given a general anesthetic, a tube will be put down your throat to help you breathe. Two small incisions will be made in your lower abdomen and near your belly button. Your abdomen will be inflated with a gas. This will let the surgeon see better and will give the surgeon room to work. A lighted tube with camera (laparoscope) will be inserted into your abdomen through one of the incisions. Small instruments will be inserted through the other incision. The fallopian tubes will be tied off, burned (cauterized), or blocked with a clip, ring, or clamp. A small portion in the center of each fallopian tube may be removed. The gas will be released from the abdomen. The incisions will be closed with stitches (sutures). A bandage (dressing) will be placed over the incisions. The procedure may vary among health care providers and hospitals. What happens after the procedure? Your blood pressure, heart rate, breathing rate, and blood oxygen level will be monitored until you leave the hospital. You will be given medicine to help with pain, nausea, and vomiting as needed. You may have vaginal discharge after the procedure. You may need to wear a sanitary napkin. If you were given a sedative during the procedure, it can affect  you for several hours. Do not drive or operate machinery until your health care provider says that it is safe. Summary Laparoscopic tubal ligation is a procedure that is done to prevent pregnancy. You should not have this procedure if you want to get pregnant someday or if you are unsure about having more children. The procedure is done using a thin, lighted tube (laparoscope) with a camera attached that will be inserted into your abdomen through an incision. After the procedure you will be given medicine to help with pain, nausea, and vomiting as needed. Plan to have someone take you home from the hospital. This information is not intended to replace advice given to you by your health care provider. Make sure you discuss any questions you have with your health care provider. Document Revised: 09/24/2019 Document Reviewed: 09/24/2019 Elsevier Patient Education  2024 [employer].

## 2022-10-02 NOTE — H&P (Signed)
GYNECOLOGY PREOPERATIVE HISTORY AND PHYSICAL   Subjective:  Patricia Mcbride is a 42 y.o. [phone] here for surgical management of undesired fertility.  Desires permanent sterilization.  No significant preoperative concerns.  Proposed surgery: Laparoscopic Bilateral Tubal Ligation    Past Medical History:  Diagnosis Date   Depression    GERD (gastroesophageal reflux disease)    History of pre-eclampsia    Jaundice of newborn    Low grade squamous intraepithelial lesion (LGSIL) on Papanicolaou smear of cervix 11/15/2013   Formatting of this note might be different from the original.  LSIL pap, + HPV 10/2013.     Type 2 diabetes mellitus with hyperglycemia, without long-term current use of insulin (HCC) 05/28/2021    Past Surgical History:  Procedure Laterality Date   CESAREAN SECTION N/A 04/02/2022   Procedure: CESAREAN SECTION;  Surgeon: Linzie Collin, MD;  Location: [facility] ORS;  Service: Obstetrics;  Laterality: N/A;   ESOPHAGOGASTRODUODENOSCOPY (EGD) WITH PROPOFOL N/A 01/19/2019   Procedure: ESOPHAGOGASTRODUODENOSCOPY (EGD) WITH PROPOFOL;  Surgeon: Midge Minium, MD;  Location: [facility] ENDOSCOPY;  Service: Endoscopy;  Laterality: N/A;   WISDOM TOOTH EXTRACTION     3 at diff times; early 65s    OB History  Gravida Para Term Preterm AB Living  3 1 0 1 2 1   SAB IAB Ectopic Multiple Live Births    1 1 0 1    # Outcome Date GA Lbr Len/2nd Weight Sex Type Anes PTL Lv  3 Preterm 04/02/22 [redacted]w[redacted]d  4 lb 9.4 oz (2.08 kg) F CS-LTranv Spinal  LIV  2 Ectopic 2021          1 IAB 2021            Family History  Problem Relation Age of Onset   Depression Mother    Asthma Mother    Depression Father    Hypertension Father    Cancer Father 33       skin   Diabetes Father    Hyperlipidemia Father    Depression Sister    Hearing loss Sister    Miscarriages / Stillbirths Sister    Depression Sister    Hypertension Sister    Asthma Maternal Grandmother    Diabetes  Maternal Grandmother    Hearing loss Maternal Grandmother    Heart disease Maternal Grandmother    Hyperlipidemia Maternal Grandmother    Hypertension Maternal Grandmother    Kidney disease Maternal Grandmother    Dementia Paternal Grandmother    Kidney disease Paternal Grandmother    Hypertension Paternal Grandmother    Hyperlipidemia Paternal Grandmother    Heart disease Paternal Grandmother    Alzheimer's disease Paternal Grandmother    Alzheimer's disease Paternal Grandfather    Heart disease Paternal Grandfather    Hyperlipidemia Paternal Grandfather    Hypertension Paternal Grandfather    Kidney disease Paternal Grandfather    Dementia Paternal Grandfather     Social History   Socioeconomic History   Marital status: Married    Spouse name: Alecia Lemming   Number of children: 0   Years of education: 12   Highest education level: Not on file  Occupational History   Occupation: Printmaker for [employer]  Tobacco Use   Smoking status: Former   Smokeless tobacco: Never  Vaping Use   Vaping status: Former   Quit date: 09/19/2021  Substance and Sexual Activity   Alcohol use: Not Currently    Comment: socially   Drug use: Not Currently  Sexual activity: Not Currently    Partners: Male    Birth control/protection: None  Other Topics Concern   Not on file  Social History Narrative   Not on file   Social Determinants of Health   Financial Resource Strain: Low Risk  (10/24/2021)   Overall Financial Resource Strain (CARDIA)    Difficulty of Paying Living Expenses: Not hard at all  Food Insecurity: No Food Insecurity (04/01/2022)   Hunger Vital Sign    Worried About Running Out of Food in the Last Year: Never true    Ran Out of Food in the Last Year: Never true  Transportation Needs: No Transportation Needs (04/01/2022)   PRAPARE - Administrator, Civil Service (Medical): No    Lack of Transportation (Non-Medical): No  Physical Activity:  Insufficiently Active (10/24/2021)   Exercise Vital Sign    Days of Exercise per Week: 2 days    Minutes of Exercise per Session: 40 min  Stress: No Stress Concern Present (10/24/2021)   [employer] of Occupational Health - Occupational Stress Questionnaire    Feeling of Stress : Not at all  Social Connections: Unknown (10/24/2021)   Social Connection and Isolation Panel [NHANES]    Frequency of Communication with Friends and Family: More than three times a week    Frequency of Social Gatherings with Friends and Family: Once a week    Attends Religious Services: Not on Insurance claims handler of Clubs or Organizations: No    Attends Banker Meetings: Never    Marital Status: Married  Catering manager Violence: Not At Risk (04/01/2022)   Humiliation, Afraid, Rape, and Kick questionnaire    Fear of Current or Ex-Partner: No    Emotionally Abused: No    Physically Abused: No    Sexually Abused: No    Current Outpatient Medications on File Prior to Visit  Medication Sig Dispense Refill   citalopram (CELEXA) 20 MG tablet Take 1 tablet (20 mg total) by mouth 2 (two) times daily. 60 tablet 3   famotidine (PEPCID) 20 MG tablet Take 20 mg by mouth daily.     metFORMIN (GLUCOPHAGE) 500 MG tablet Take 1 tablet (500 mg total) by mouth 2 (two) times daily with a meal. 180 tablet 3   Semaglutide, 1 MG/DOSE, 4 MG/3ML SOPN Inject 1 mg as directed once a week. 3 mL 0   No current facility-administered medications on file prior to visit.    Allergies  Allergen Reactions   Morphine Nausea Only     Review of Systems Constitutional: No recent fever/chills/sweats Respiratory: No recent cough/bronchitis Cardiovascular: No chest pain Gastrointestinal: No recent nausea/vomiting/diarrhea Genitourinary: No UTI symptoms Hematologic/lymphatic:No history of coagulopathy or recent blood thinner use    Objective:   Blood pressure 128/80, pulse 84, resp. rate 16, height 5\' 2"  (1.575 m),  weight 251 lb 8 oz (114.1 kg), not currently breastfeeding. Body mass index is 46 kg/m.  CONSTITUTIONAL: Well-developed, well-nourished female in no acute distress.  HENT:  Normocephalic, atraumatic, External right and left ear normal. Oropharynx is clear and moist EYES: Conjunctivae and EOM are normal. Pupils are equal, round, and reactive to light. No scleral icterus.  NECK: Normal range of motion, supple, no masses SKIN: Skin is warm and dry. No rash noted. Not diaphoretic. No erythema. No pallor. NEUROLOGIC: Alert and oriented to person, place, and time. Normal reflexes, muscle tone coordination. No cranial nerve deficit noted. PSYCHIATRIC: Normal mood and affect. Normal behavior. Normal  judgment and thought content. CARDIOVASCULAR: Normal heart rate noted, regular rhythm RESPIRATORY: Effort and breath sounds normal, no problems with respiration noted ABDOMEN: Soft, nontender, nondistended. PELVIC: Deferred MUSCULOSKELETAL: Normal range of motion. No edema and no tenderness. 2+ distal pulses.    Labs: No results found for this or any previous visit (from the past 336 hour(s)).   Imaging Studies: No results found.  Assessment:    1. Unwanted fertility   2. Increased BMI   3. Type 2 diabetes mellitus with hyperglycemia, without long-term current use of insulin (HCC)      Plan:   - 1. Unwanted fertility - Patient desires permanent sterilization.  Other reversible forms of contraception were discussed with patient; she declines all other modalities. Risks of procedure discussed with patient including but not limited to: risk of regret, permanence of method, bleeding, infection, injury to surrounding organs and need for additional procedures.  Failure risk of about 1% with increased risk of ectopic gestation if pregnancy occurs was also discussed with patient.  Also discussed possibility of post-tubal pain syndrome. Patient verbalized understanding of these risks and wants to  proceed with sterilization.  Medicaid sterilization forms completed.  Will need to have schedule surgery 30 days from this date.  Patient would like to have surgery on 11/04/2022.  Will schedule. - Preop testing ordered. - Instructions reviewed, including NPO after midnight.  2. Increased BMI -BMI currently 46.  Plan to perform surgery laparoscopically.    3. Type 2 diabetes mellitus with hyperglycemia, without long-term current use of insulin (HCC) -Currently controlled on current medications metformin 500 mg twice daily, Ozempic 0.5 mg weekly.  Last A1c was 6.6.  No increased risk with minor surgery.       Hildred Laser, MD [facility]

## 2022-10-06 ENCOUNTER — Encounter: Payer: Self-pay | Admitting: Nurse Practitioner

## 2022-10-07 ENCOUNTER — Other Ambulatory Visit: Payer: Self-pay | Admitting: Nurse Practitioner

## 2022-10-07 DIAGNOSIS — E1165 Type 2 diabetes mellitus with hyperglycemia: Secondary | ICD-10-CM

## 2022-10-07 MED ORDER — SEMAGLUTIDE (1 MG/DOSE) 4 MG/3ML ~~LOC~~ SOPN: 1.0000 mg | PEN_INJECTOR | SUBCUTANEOUS | 1 refills | Status: DC

## 2022-10-28 ENCOUNTER — Encounter: Payer: Self-pay | Admitting: *Deleted

## 2022-10-28 ENCOUNTER — Encounter
Admission: RE | Admit: 2022-10-28 | Discharge: 2022-10-28 | Disposition: A | Discharge status: Home / Self Care | Payer: Medicaid Other | Source: Ambulatory Visit | Attending: Obstetrics and Gynecology | Admitting: Obstetrics and Gynecology

## 2022-10-28 ENCOUNTER — Other Ambulatory Visit: Payer: Self-pay

## 2022-10-28 VITALS — Ht 62.0 in | Wt 251.0 lb

## 2022-10-28 DIAGNOSIS — Z01812 Encounter for preprocedural laboratory examination: Secondary | ICD-10-CM

## 2022-10-28 DIAGNOSIS — E782 Mixed hyperlipidemia: Secondary | ICD-10-CM

## 2022-10-28 DIAGNOSIS — E1165 Type 2 diabetes mellitus with hyperglycemia: Secondary | ICD-10-CM

## 2022-10-28 HISTORY — DX: Mixed hyperlipidemia: E78.2

## 2022-10-28 HISTORY — DX: Body Mass Index (BMI) 40.0 and over, adult: Z684

## 2022-10-28 HISTORY — DX: Morbid (severe) obesity due to excess calories: E66.01

## 2022-10-28 NOTE — Patient Instructions (Addendum)
Your procedure is scheduled on: Monday,October 14 Report to the Registration Desk on the 1st floor of the [facility]. To find out your arrival time, please [place] [phone] between 1PM - 3PM on: Friday, October 11 If your arrival time is 6:00 am, do not arrive before that time as the [facility] entrance doors do not open until 6:00 am.  REMEMBER: Instructions that are not followed completely may result in serious medical risk, up to and including death; or upon the discretion of your surgeon and anesthesiologist your surgery may need to be rescheduled.  Do not eat or drink after midnight the night before surgery.  No gum chewing or hard candies.  One week prior to surgery: starting October 7 Stop Anti-inflammatories (NSAIDS) such as Advil, Aleve, Ibuprofen, Motrin, Naproxen, Naprosyn and Aspirin based products such as Excedrin, Goody's Powder, BC Powder. Stop ANY OVER THE COUNTER supplements until after surgery. You may however, continue to take Tylenol if needed for pain up until the day of surgery.  Continue taking all prescribed medications with the exception of the following:  Metoformin - stop 2 days before surgery. Last day to take metformin is Friday, October 11. Resume AFTER surgery.  Semaglutide (Ozempic) - stop 7 days before surgery. Do NOT take Ozempic on Sunday, October 13. Resume on your regular day, Sunday, October 20.  TAKE ONLY THESE MEDICATIONS THE MORNING OF SURGERY WITH A SIP OF WATER:  Citalopram (Celexa) Famotidine (Pepcid) - (take one the night before and one on the morning of surgery - helps to prevent nausea after surgery.)  No Alcohol for 24 hours before or after surgery.  No Smoking including e-cigarettes for 24 hours before surgery.  No chewable tobacco products for at least 6 hours before surgery.  No nicotine patches on the day of surgery.  Do not use any "recreational" drugs for at least a week (preferably 2 weeks) before your surgery.  Please be  advised that the combination of cocaine and anesthesia may have negative outcomes, up to and including death. If you test positive for cocaine, your surgery will be cancelled.  On the morning of surgery brush your teeth with toothpaste and water, you may rinse your mouth with mouthwash if you wish. Do not swallow any toothpaste or mouthwash.  Use CHG Soap as directed on instruction sheet.  Do not wear jewelry, make-up, hairpins, clips or nail polish.  For welded (permanent) jewelry: bracelets, anklets, waist bands, etc.  Please have this removed prior to surgery.  If it is not removed, there is a chance that hospital personnel will need to cut it off on the day of surgery.  Do not wear lotions, powders, or perfumes.   Do not shave body hair from the neck down 48 hours before surgery.  Contact lenses, hearing aids and dentures may not be worn into surgery.  Do not bring valuables to the hospital. [facility] is not responsible for any missing/lost belongings or valuables.   Notify your doctor if there is any change in your medical condition (cold, fever, infection).  Wear comfortable clothing (specific to your surgery type) to the hospital.  After surgery, you can help prevent lung complications by doing breathing exercises.  Take deep breaths and cough every 1-2 hours. Your doctor may order a device called an Incentive Spirometer to help you take deep breaths. When coughing or sneezing, hold a pillow firmly against your incision with both hands. This is called "splinting." Doing this helps protect your incision. It  also decreases belly discomfort.  If you are being discharged the day of surgery, you will not be allowed to drive home. You will need a responsible individual to drive you home and stay with you for 24 hours after surgery.   If you are taking public transportation, you will need to have a responsible individual with you.  Please [place] the Pre-admissions Testing Dept. at  [phone] if you have any questions about these instructions.  Surgery Visitation Policy:  Patients having surgery or a procedure may have two visitors.  Children under the age of 31 must have an adult with them who is not the patient.     Preparing for Surgery with CHLORHEXIDINE GLUCONATE (CHG) Soap  Chlorhexidine Gluconate (CHG) Soap  o An antiseptic cleaner that kills germs and bonds with the skin to continue killing germs even after washing  o Used for showering the night before surgery and morning of surgery  Before surgery, you can play an important role by reducing the number of germs on your skin.  CHG (Chlorhexidine gluconate) soap is an antiseptic cleanser which kills germs and bonds with the skin to continue killing germs even after washing.  Please do not use if you have an allergy to CHG or antibacterial soaps. If your skin becomes reddened/irritated stop using the CHG.  1. Shower the NIGHT BEFORE SURGERY and the MORNING OF SURGERY with CHG soap.  2. If you choose to wash your hair, wash your hair first as usual with your normal shampoo.  3. After shampooing, rinse your hair and body thoroughly to remove the shampoo.  4. Use CHG as you would any other liquid soap. You can apply CHG directly to the skin and wash gently with a scrungie or a clean washcloth.  5. Apply the CHG soap to your body only from the neck down. Do not use on open wounds or open sores. Avoid contact with your eyes, ears, mouth, and genitals (private parts). Wash face and genitals (private parts) with your normal soap.  6. Wash thoroughly, paying special attention to the area where your surgery will be performed.  7. Thoroughly rinse your body with warm water.  8. Do not shower/wash with your normal soap after using and rinsing off the CHG soap.  9. Pat yourself dry with a clean towel.  10. Wear clean pajamas to bed the night before surgery.  12. Place clean sheets on your bed the night  of your first shower and do not sleep with pets.  13. Shower again with the CHG soap on the day of surgery prior to arriving at the hospital.  14. Do not apply any deodorants/lotions/powders.  15. Please wear clean clothes to the hospital.

## 2022-10-29 ENCOUNTER — Encounter
Admission: RE | Admit: 2022-10-29 | Discharge: 2022-10-29 | Disposition: A | Discharge status: Home / Self Care | Payer: Medicaid Other | Source: Ambulatory Visit | Attending: Obstetrics and Gynecology | Admitting: Obstetrics and Gynecology

## 2022-10-29 ENCOUNTER — Encounter: Payer: Self-pay | Admitting: Nurse Practitioner

## 2022-10-29 DIAGNOSIS — Z01812 Encounter for preprocedural laboratory examination: Secondary | ICD-10-CM | POA: Insufficient documentation

## 2022-10-29 DIAGNOSIS — E782 Mixed hyperlipidemia: Secondary | ICD-10-CM | POA: Diagnosis not present

## 2022-10-29 DIAGNOSIS — R9431 Abnormal electrocardiogram [ECG] [EKG]: Secondary | ICD-10-CM | POA: Diagnosis not present

## 2022-10-29 DIAGNOSIS — Z3009 Encounter for other general counseling and advice on contraception: Secondary | ICD-10-CM | POA: Diagnosis present

## 2022-10-29 DIAGNOSIS — E1165 Type 2 diabetes mellitus with hyperglycemia: Secondary | ICD-10-CM | POA: Insufficient documentation

## 2022-10-29 DIAGNOSIS — Z01818 Encounter for other preprocedural examination: Secondary | ICD-10-CM | POA: Diagnosis present

## 2022-10-29 DIAGNOSIS — Z01810 Encounter for preprocedural cardiovascular examination: Secondary | ICD-10-CM | POA: Insufficient documentation

## 2022-10-29 LAB — CBC
HCT: 37.9 % (ref 36.0–46.0)
Hemoglobin: 12.2 g/dL (ref 12.0–15.0)
MCH: 26.5 pg (ref 26.0–34.0)
MCHC: 32.2 g/dL (ref 30.0–36.0)
MCV: 82.2 fL (ref 80.0–100.0)
Platelets: 383 10*3/uL (ref 150–400)
RBC: 4.61 MIL/uL (ref 3.87–5.11)
RDW: 13.5 % (ref 11.5–15.5)
WBC: 11.5 10*3/uL — ABNORMAL HIGH (ref 4.0–10.5)
nRBC: 0 % (ref 0.0–0.2)

## 2022-10-29 NOTE — Progress Notes (Signed)
  Perioperative Services Pre-Admission/Anesthesia Testing    Date: 10/29/22  Name: Patricia Mcbride MRN:   [id number]  Re: GLP-1 clearance and provider recommendations   Planned Surgical Procedure(s):    Case: [id number] Date/Time: 11/04/22 1136   Procedure: LAPAROSCOPIC BILATERAL TUBAL LIGATION (Bilateral)   Anesthesia type: General   Pre-op diagnosis: Undesired Fertility   Location: [facility] OR ROOM 05 / [facility]   Surgeons: Hildred Laser, MD      Clinical Notes:  Patient is scheduled for the above procedure with the indicated provider/surgeon. In review of her medication reconciliation it was noted that patient is on a prescribed GLP-1 medication. Per guidelines issued by the American Society of Anesthesiologists (ASA), it is recommended that these medications be held for 7 days prior to the patient undergoing any type of elective surgical procedure. The patient is taking the following GLP-1 medication:  [x]  SEMAGLUTIDE   []  EXENATIDE  []  LIRAGLUTIDE   []  LIXISENATIDE  []  DULAGLUTIDE     []  TIRZEPATIDE (GLP-1/GIP)  Reached out to prescribing provider Zane Herald, FNP-C) to make them aware of the guidelines from anesthesia. Given that this patient takes the prescribed GLP-1 medication for her  diabetes diagnosis, rather than for weight loss, recommendations from the prescribing provider were solicited. Prescribing provider made aware of the following so that informed decision/POC can be developed for this patient that may be taking medications belonging to these drug classes:  Oral GLP-1 medications will be held 1 day prior to surgery.  Injectable GLP-1 medications will be held 7 days prior to surgery.  Metformin is routinely held 48 hours prior to surgery due to renal concerns, potential need for contrasted imaging perioperatively, and the potential for tissue hypoxia leading to drug induced lactic acidosis.  All SGLT2i medications are held 72 hours prior to  surgery as they can be associated with the increased potential for developing euglycemic diabetic ketoacidosis (EDKA).   Impression and Plan:  Patricia Mcbride is on a prescribed GLP-1 medication, which induces the known side effect of decreased gastric emptying. Efforts are bring made to mitigate the risk of perioperative hyperglycemic events, as elevated blood glucose levels have been found to contribute to intra/postoperative complications. Additionally, hyperglycemic extremes can potentially necessitate the postponing of a patient's elective case in order to better optimize perioperative glycemic control, again with the aforementioned guidelines in place. With this in mind, recommendations have been sought from the prescribing provider, who has cleared patient to proceed with holding the prescribed GLP-1 as per the guidelines from the ASA.   Provider recommending: no further recommendations received from the prescribing provider.  Copy of signed clearance and recommendations placed on patient's chart for inclusion in their medical record and for review by the surgical/anesthetic team on the day of her procedure.   Quentin Mulling, MSN, APRN, FNP-C, CEN [facility]  Perioperative Services Nurse Practitioner Phone: [phone] Fax: [phone] 10/29/22 8:26 AM  NOTE: This note has been prepared using Dragon dictation software. Despite my best ability to proofread, there is always the potential that unintentional transcriptional errors may still occur from this process.

## 2022-10-30 ENCOUNTER — Other Ambulatory Visit: Payer: Self-pay | Admitting: Nurse Practitioner

## 2022-10-30 DIAGNOSIS — E1165 Type 2 diabetes mellitus with hyperglycemia: Secondary | ICD-10-CM

## 2022-10-30 MED ORDER — SEMAGLUTIDE (1 MG/DOSE) 4 MG/3ML ~~LOC~~ SOPN: 1.0000 mg | PEN_INJECTOR | SUBCUTANEOUS | 1 refills | Status: DC

## 2022-11-03 MED ORDER — ACETAMINOPHEN 500 MG PO TABS
1000.0000 mg | ORAL_TABLET | ORAL | Status: AC
  Administered 2022-11-04: 1000 mg via ORAL

## 2022-11-03 MED ORDER — ORAL CARE MOUTH RINSE: 15.0000 mL | Freq: Once | OROMUCOSAL | Status: AC

## 2022-11-03 MED ORDER — CELECOXIB 200 MG PO CAPS
400.0000 mg | ORAL_CAPSULE | ORAL | Status: AC
  Administered 2022-11-04: 400 mg via ORAL

## 2022-11-03 MED ORDER — GABAPENTIN 300 MG PO CAPS
300.0000 mg | ORAL_CAPSULE | ORAL | Status: AC
  Administered 2022-11-04: 300 mg via ORAL

## 2022-11-03 MED ORDER — CHLORHEXIDINE GLUCONATE 0.12 % MT SOLN
15.0000 mL | Freq: Once | OROMUCOSAL | Status: AC
  Administered 2022-11-04: 15 mL via OROMUCOSAL

## 2022-11-03 MED ORDER — SODIUM CHLORIDE 0.9 % IV SOLN: INTRAVENOUS | Status: DC

## 2022-11-03 MED ORDER — LACTATED RINGERS IV SOLN: INTRAVENOUS | Status: DC

## 2022-11-04 ENCOUNTER — Other Ambulatory Visit: Payer: Self-pay

## 2022-11-04 ENCOUNTER — Ambulatory Visit: Payer: Medicaid Other | Admitting: Urgent Care

## 2022-11-04 ENCOUNTER — Encounter: Payer: Self-pay | Admitting: Obstetrics and Gynecology

## 2022-11-04 ENCOUNTER — Ambulatory Visit
Admission: RE | Admit: 2022-11-04 | Discharge: 2022-11-04 | Disposition: A | Discharge status: Home / Self Care | Payer: Medicaid Other | Attending: Obstetrics and Gynecology | Admitting: Obstetrics and Gynecology

## 2022-11-04 ENCOUNTER — Encounter
Admission: RE | Disposition: A | Discharge status: Home / Self Care | Payer: Self-pay | Source: Home / Self Care | Attending: Obstetrics and Gynecology

## 2022-11-04 DIAGNOSIS — F418 Other specified anxiety disorders: Secondary | ICD-10-CM | POA: Diagnosis not present

## 2022-11-04 DIAGNOSIS — Z302 Encounter for sterilization: Secondary | ICD-10-CM | POA: Insufficient documentation

## 2022-11-04 DIAGNOSIS — Z87891 Personal history of nicotine dependence: Secondary | ICD-10-CM | POA: Insufficient documentation

## 2022-11-04 DIAGNOSIS — Z7985 Long-term (current) use of injectable non-insulin antidiabetic drugs: Secondary | ICD-10-CM | POA: Diagnosis not present

## 2022-11-04 DIAGNOSIS — K219 Gastro-esophageal reflux disease without esophagitis: Secondary | ICD-10-CM | POA: Diagnosis not present

## 2022-11-04 DIAGNOSIS — Z3009 Encounter for other general counseling and advice on contraception: Secondary | ICD-10-CM

## 2022-11-04 DIAGNOSIS — Z01812 Encounter for preprocedural laboratory examination: Secondary | ICD-10-CM

## 2022-11-04 DIAGNOSIS — Z98891 History of uterine scar from previous surgery: Secondary | ICD-10-CM | POA: Diagnosis not present

## 2022-11-04 DIAGNOSIS — Z6841 Body Mass Index (BMI) 40.0 and over, adult: Secondary | ICD-10-CM | POA: Insufficient documentation

## 2022-11-04 DIAGNOSIS — E1165 Type 2 diabetes mellitus with hyperglycemia: Secondary | ICD-10-CM | POA: Insufficient documentation

## 2022-11-04 DIAGNOSIS — Z640 Problems related to unwanted pregnancy: Secondary | ICD-10-CM | POA: Diagnosis not present

## 2022-11-04 HISTORY — PX: LAPAROSCOPIC BILATERAL SALPINGECTOMY: SHX5889

## 2022-11-04 LAB — POCT PREGNANCY, URINE: Preg Test, Ur: NEGATIVE

## 2022-11-04 LAB — GLUCOSE, CAPILLARY
Glucose-Capillary: 97 mg/dL (ref 70–99)
Glucose-Capillary: 158 mg/dL — ABNORMAL HIGH (ref 70–99)

## 2022-11-04 SURGERY — SALPINGECTOMY, BILATERAL, LAPAROSCOPIC
Anesthesia: General | Laterality: Bilateral

## 2022-11-04 MED ORDER — FENTANYL CITRATE (PF) 100 MCG/2ML IJ SOLN
INTRAMUSCULAR | Status: AC
  Filled 2022-11-04: qty 2

## 2022-11-04 MED ORDER — LIDOCAINE HCL (CARDIAC) PF 100 MG/5ML IV SOSY
PREFILLED_SYRINGE | INTRAVENOUS | Status: DC | PRN
  Administered 2022-11-04: 80 mg via INTRAVENOUS

## 2022-11-04 MED ORDER — FENTANYL CITRATE (PF) 100 MCG/2ML IJ SOLN
25.0000 ug | INTRAMUSCULAR | Status: DC | PRN
  Administered 2022-11-04 (×4): 25 ug via INTRAVENOUS

## 2022-11-04 MED ORDER — OXYCODONE HCL 5 MG/5ML PO SOLN: 5.0000 mg | Freq: Once | ORAL | Status: AC | PRN

## 2022-11-04 MED ORDER — ROCURONIUM BROMIDE 10 MG/ML (PF) SYRINGE
PREFILLED_SYRINGE | INTRAVENOUS | Status: AC
  Filled 2022-11-04: qty 10

## 2022-11-04 MED ORDER — CELECOXIB 200 MG PO CAPS
ORAL_CAPSULE | ORAL | Status: AC
  Filled 2022-11-04: qty 2

## 2022-11-04 MED ORDER — ROCURONIUM BROMIDE 100 MG/10ML IV SOLN
INTRAVENOUS | Status: DC | PRN
  Administered 2022-11-04: 50 mg via INTRAVENOUS
  Administered 2022-11-04: 20 mg via INTRAVENOUS
  Administered 2022-11-04: 5 mg via INTRAVENOUS

## 2022-11-04 MED ORDER — ACETAMINOPHEN 500 MG PO TABS
ORAL_TABLET | ORAL | Status: AC
  Filled 2022-11-04: qty 2

## 2022-11-04 MED ORDER — OXYCODONE HCL 5 MG PO TABS
5.0000 mg | ORAL_TABLET | Freq: Once | ORAL | Status: AC | PRN
  Administered 2022-11-04: 5 mg via ORAL

## 2022-11-04 MED ORDER — DROPERIDOL 2.5 MG/ML IJ SOLN
INTRAMUSCULAR | Status: AC
  Filled 2022-11-04: qty 2

## 2022-11-04 MED ORDER — DEXMEDETOMIDINE HCL IN NACL 80 MCG/20ML IV SOLN
INTRAVENOUS | Status: DC | PRN
Start: 2022-11-04 — End: 2022-11-04
  Administered 2022-11-04: 12 ug via INTRAVENOUS

## 2022-11-04 MED ORDER — ONDANSETRON HCL 4 MG/2ML IJ SOLN
INTRAMUSCULAR | Status: AC
  Filled 2022-11-04: qty 2

## 2022-11-04 MED ORDER — DOCUSATE SODIUM 100 MG PO CAPS: 100.0000 mg | ORAL_CAPSULE | Freq: Two times a day (BID) | ORAL | 2 refills | Status: DC | PRN

## 2022-11-04 MED ORDER — ONDANSETRON HCL 4 MG/2ML IJ SOLN
INTRAMUSCULAR | Status: DC | PRN
  Administered 2022-11-04: 4 mg via INTRAVENOUS

## 2022-11-04 MED ORDER — SODIUM CHLORIDE 0.9% FLUSH: 3.0000 mL | Freq: Two times a day (BID) | INTRAVENOUS | Status: DC

## 2022-11-04 MED ORDER — SUGAMMADEX SODIUM 200 MG/2ML IV SOLN
INTRAVENOUS | Status: DC | PRN
  Administered 2022-11-04: 226.8 mg via INTRAVENOUS

## 2022-11-04 MED ORDER — PROPOFOL 10 MG/ML IV BOLUS
INTRAVENOUS | Status: AC
  Filled 2022-11-04: qty 20

## 2022-11-04 MED ORDER — LIDOCAINE HCL (PF) 2 % IJ SOLN
INTRAMUSCULAR | Status: AC
  Filled 2022-11-04: qty 5

## 2022-11-04 MED ORDER — CEFAZOLIN SODIUM-DEXTROSE 2-4 GM/100ML-% IV SOLN
INTRAVENOUS | Status: AC
  Filled 2022-11-04: qty 100

## 2022-11-04 MED ORDER — OXYCODONE HCL 5 MG PO TABS
ORAL_TABLET | ORAL | Status: AC
  Filled 2022-11-04: qty 1

## 2022-11-04 MED ORDER — DEXAMETHASONE SODIUM PHOSPHATE 10 MG/ML IJ SOLN
INTRAMUSCULAR | Status: AC
  Filled 2022-11-04: qty 1

## 2022-11-04 MED ORDER — MIDAZOLAM HCL 2 MG/2ML IJ SOLN
INTRAMUSCULAR | Status: AC
  Filled 2022-11-04: qty 2

## 2022-11-04 MED ORDER — DROPERIDOL 2.5 MG/ML IJ SOLN
0.6250 mg | Freq: Once | INTRAMUSCULAR | Status: AC
  Administered 2022-11-04: 0.625 mg via INTRAVENOUS
  Filled 2022-11-04: qty 2

## 2022-11-04 MED ORDER — BUPIVACAINE HCL (PF) 0.5 % IJ SOLN
INTRAMUSCULAR | Status: AC
  Filled 2022-11-04: qty 30

## 2022-11-04 MED ORDER — OXYCODONE-ACETAMINOPHEN 5-325 MG PO TABS
1.0000 | ORAL_TABLET | ORAL | 0 refills | Status: DC | PRN
Start: 2022-11-04 — End: 2022-11-20

## 2022-11-04 MED ORDER — IBUPROFEN 600 MG PO TABS: 600.0000 mg | ORAL_TABLET | Freq: Four times a day (QID) | ORAL | 1 refills | Status: DC | PRN

## 2022-11-04 MED ORDER — PHENYLEPHRINE 80 MCG/ML (10ML) SYRINGE FOR IV PUSH (FOR BLOOD PRESSURE SUPPORT)
PREFILLED_SYRINGE | INTRAVENOUS | Status: DC | PRN
  Administered 2022-11-04 (×4): 80 ug via INTRAVENOUS

## 2022-11-04 MED ORDER — BUPIVACAINE HCL 0.5 % IJ SOLN
INTRAMUSCULAR | Status: DC | PRN
  Administered 2022-11-04: 30 mL

## 2022-11-04 MED ORDER — SIMETHICONE 80 MG PO TABS: 1.0000 | ORAL_TABLET | Freq: Four times a day (QID) | ORAL | 0 refills | Status: DC | PRN

## 2022-11-04 MED ORDER — GABAPENTIN 300 MG PO CAPS
ORAL_CAPSULE | ORAL | Status: AC
  Filled 2022-11-04: qty 1

## 2022-11-04 MED ORDER — DEXAMETHASONE SODIUM PHOSPHATE 10 MG/ML IJ SOLN
INTRAMUSCULAR | Status: DC | PRN
  Administered 2022-11-04: 10 mg via INTRAVENOUS

## 2022-11-04 MED ORDER — 0.9 % SODIUM CHLORIDE (POUR BTL) OPTIME
TOPICAL | Status: DC | PRN
  Administered 2022-11-04: 500 mL

## 2022-11-04 MED ORDER — FENTANYL CITRATE (PF) 100 MCG/2ML IJ SOLN
INTRAMUSCULAR | Status: DC | PRN
  Administered 2022-11-04 (×3): 50 ug via INTRAVENOUS

## 2022-11-04 MED ORDER — CHLORHEXIDINE GLUCONATE 0.12 % MT SOLN
OROMUCOSAL | Status: AC
  Filled 2022-11-04: qty 15

## 2022-11-04 MED ORDER — MIDAZOLAM HCL 2 MG/2ML IJ SOLN
INTRAMUSCULAR | Status: DC | PRN
  Administered 2022-11-04: 2 mg via INTRAVENOUS

## 2022-11-04 MED ORDER — PROPOFOL 10 MG/ML IV BOLUS
INTRAVENOUS | Status: DC | PRN
  Administered 2022-11-04: 200 mg via INTRAVENOUS

## 2022-11-04 SURGICAL SUPPLY — 45 items
ADH SKN CLS APL DERMABOND .7 (GAUZE/BANDAGES/DRESSINGS) ×1
APL PRP STRL LF DISP 70% ISPRP (MISCELLANEOUS) ×1
BLADE SURG SZ11 CARB STEEL (BLADE) ×1 IMPLANT
CATH ROBINSON RED A/P 16FR (CATHETERS) ×1 IMPLANT
CHLORAPREP W/TINT 26 (MISCELLANEOUS) ×1 IMPLANT
CORD MONOPOLAR M/FML 12FT (MISCELLANEOUS) IMPLANT
DERMABOND ADVANCED .7 DNX12 (GAUZE/BANDAGES/DRESSINGS) ×1 IMPLANT
GAUZE 4X4 16PLY ~~LOC~~+RFID DBL (SPONGE) ×2 IMPLANT
GLOVE BIO SURGEON STRL SZ 6.5 (GLOVE) ×2 IMPLANT
GLOVE INDICATOR 7.0 STRL GRN (GLOVE) ×1 IMPLANT
GLOVE PI ORTHO PRO STRL 7.5 (GLOVE) ×1 IMPLANT
GOWN STRL REUS W/ TWL LRG LVL3 (GOWN DISPOSABLE) ×3 IMPLANT
GOWN STRL REUS W/TWL LRG LVL3 (GOWN DISPOSABLE) ×3
GOWN STRL REUS W/TWL XL LVL4 (GOWN DISPOSABLE) ×1 IMPLANT
GRASPER SUT TROCAR 14GX15 (MISCELLANEOUS) IMPLANT
IRRIGATION STRYKERFLOW (MISCELLANEOUS) ×1 IMPLANT
IRRIGATOR STRYKERFLOW (MISCELLANEOUS)
IV LACTATED RINGERS 1000ML (IV SOLUTION) ×1 IMPLANT
KIT PINK PAD W/HEAD ARE REST (MISCELLANEOUS) ×1
KIT PINK PAD W/HEAD ARM REST (MISCELLANEOUS) ×1 IMPLANT
KIT TURNOVER CYSTO (KITS) ×1 IMPLANT
MANIFOLD NEPTUNE II (INSTRUMENTS) ×1 IMPLANT
NS IRRIG 500ML POUR BTL (IV SOLUTION) ×1 IMPLANT
PACK GYN LAPAROSCOPIC (MISCELLANEOUS) ×1 IMPLANT
PAD OB MATERNITY 4.3X12.25 (PERSONAL CARE ITEMS) ×1 IMPLANT
PAD PREP OB/GYN DISP 24X41 (PERSONAL CARE ITEMS) ×1 IMPLANT
SCISSORS METZENBAUM CVD 33 (INSTRUMENTS) IMPLANT
SCRUB CHG 4% DYNA-HEX 4OZ (MISCELLANEOUS) ×1 IMPLANT
SET TUBE SMOKE EVAC HIGH FLOW (TUBING) ×1 IMPLANT
SHEARS HARMONIC 36 ACE (MISCELLANEOUS) IMPLANT
SLEEVE Z-THREAD 5X100MM (TROCAR) ×1 IMPLANT
SUT VIC AB 0 CT2 27 (SUTURE) IMPLANT
SUT VIC AB 3-0 SH 27 (SUTURE)
SUT VIC AB 3-0 SH 27X BRD (SUTURE) IMPLANT
SUT VIC AB 4-0 FS2 27 (SUTURE) ×1 IMPLANT
SUT VICRYL 0 UR6 27IN ABS (SUTURE) ×1 IMPLANT
SYR 50ML LL SCALE MARK (SYRINGE) IMPLANT
SYS BAG RETRIEVAL 10MM (BASKET)
SYSTEM BAG RETRIEVAL 10MM (BASKET) IMPLANT
TRAP FLUID SMOKE EVACUATOR (MISCELLANEOUS) ×1 IMPLANT
TROCAR XCEL UNIV SLVE 11M 100M (ENDOMECHANICALS) IMPLANT
TROCAR Z-THRD FIOS HNDL 11X100 (TROCAR) ×1 IMPLANT
TROCAR Z-THREAD FIOS 12X100MM (TROCAR) IMPLANT
TROCAR Z-THREAD FIOS 5X100MM (TROCAR) ×1 IMPLANT
WATER STERILE IRR 500ML POUR (IV SOLUTION) ×1 IMPLANT
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
## 2022-11-04 NOTE — Op Note (Signed)
Procedure(s): LAPAROSCOPIC BILATERAL TUBAL LIGATION LYSIS OF ADHESIONS Procedure Note  Osa Fogarty Hulet female 42 y.o. 11/04/2022  Indications: The patient is a 42 y.o. [id number] female with undesired fertility.  History of prior abdominal surgery (C-section x 1).   Pre-operative Diagnosis: Undesired fertility, history of prior C-section  Post-operative Diagnosis: Same, with abdominal adhesions  Surgeon: Hildred Laser, MD  Assistants:  Surgical scrub assist.   Anesthesia: General endotracheal anesthesia  Findings: The uterus was sounded to 9 cm. Fallopian tubes and ovaries appeared normal. Left fallopian tube suspended, incarcerated in omental adhesions to the anterior abdominal wall.   Dense omental adhesions to anterior abdominal wall. Peritoneal cyst was noted at umbilical adhesion site.  Procedure Details: The patient was seen in the Holding Room. The risks, benefits, complications, treatment options, and expected outcomes were discussed with the patient.  The patient concurred with the proposed plan, giving informed consent.  The site of surgery properly noted/marked. The patient was taken to the Operating Room, identified as Thamas Jaegers Shiffler and the procedure verified as Procedure(s) (LRB): LAPAROSCOPIC BILATERAL TUBAL LIGATION (Bilateral). A Time Out was held and the above information confirmed.  She was then placed under general anesthesia without difficulty. She was placed in the dorsal lithotomy position, and was prepped and draped in a sterile manner.  A straight catheterization was performed. A sterile speculum was inserted into the vagina and the cervix was grasped at the anterior lip using a single-toothed tenaculum.  The uterus was sounded to 9 cm, and a Hulka clamp was placed for uterine manipulation.  The speculum and tenaculum were then removed. After an adequate timeout was performed, attention was turned to the abdomen where an umbilical incision was made with  the scalpel.  The Optiview 12-mm trocar and sleeve were then advanced without difficulty with the laparoscope under direct visualization into the abdomen.  The abdomen was then insufflated with carbon dioxide gas and adequate pneumoperitoneum was obtained.  The trochar appeared to be abutting abdominal adhesions of the omentum.   Bilateral  5-mm lower quadrant ports were then placed under direct visualization.  A survey of the patient's pelvis and abdomen revealed the findings as above.    Moderate adhesions for the omentum to the anterior abdominal wall were appreciated, from the umbilical region down to the pelvis, with suspension of the left fallopian tube within the adhesions. The bowel was noted to be free from the adhesions The camera was moved to the right lower port site to gain better visualization and access to the adhesions. A Harmonic device was then used to perform adhesiolysis of the omental adhesions to the abdominal wall. The left  fallopian tube was able to be dissected from the adhesions.  Hemostasis was noted after adhesiolysis.   Next, the left fallopian tube was grasped and tented up.  The Harmonic device was then used to grasp the tube approximately 4 cm from the cornual region and the fallopian tube was transected and ligated. The right fallopian tube was then transected and ligated in a similar fashion.  Good hemostasis was noted with bilateral fallopian tubes. The peritoneal cyst at the umbilical region was then incised and drained of small amount of clear fluid.   The 12-mm trochar was then removed. The PMI port site closure system was used to approximate the incision, using two interrupted sutures of 0-Vicryl.   All skin incisions were closed with 4-0 Vicryl subcuticular stitches.The incisions were injected with a total of 20 ml of 0.5% Sensorcaine.  Dermabond was placed over the incisions. The patient tolerated the procedures well.  All instruments, needles, and sponge counts were  correct x 2. The patient was taken to the recovery room awake, extubated and in stable condition.   Approximately 1 hour was spent for duration of this case, and >50% of the total case time was spent performing lysis of adhesions.   Estimated Blood Loss:  minimal      Drains: straight catheterization prior to procedure with  200 ml of clear urine         Total IV Fluids:  800 ml  Specimens: None         Implants: None         Complications:  None; patient tolerated the procedure well.         Disposition: PACU - hemodynamically stable.         Condition: stable   Hildred Laser, MD [facility] at [facility]
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
## 2022-11-04 NOTE — Discharge Instructions (Signed)

## 2022-11-04 NOTE — Transfer of Care (Signed)
Immediate Anesthesia Transfer of Care Note  Patient: Patricia Mcbride  Procedure(s) Performed: LAPAROSCOPIC BILATERAL TUBAL LIGATION (Bilateral)  Patient Location: PACU  Anesthesia Type:General  Level of Consciousness: awake, alert , and oriented  Airway & Oxygen Therapy: Patient Spontanous Breathing and Patient connected to face mask oxygen  Post-op Assessment: Report given to RN and Post -op Vital signs reviewed and stable  Post vital signs: Reviewed and stable  Last Vitals:  Vitals Value Taken Time  BP 137/74 11/04/22 1325  Temp    Pulse 86 11/04/22 1327  Resp 28 11/04/22 1327  SpO2 100 % 11/04/22 1327  Vitals shown include unfiled device data.  Last Pain:  Vitals:   11/04/22 1031  TempSrc: Oral  PainSc: 0-No pain         Complications: No notable events documented.

## 2022-11-04 NOTE — Progress Notes (Signed)
Patient reports nausea resolved with medication administered. States ready to discharge home.

## 2022-11-04 NOTE — Interval H&P Note (Signed)
History and Physical Interval Note:  11/04/2022 11:42 AM  Patricia Mcbride  has presented today for surgery, with the diagnosis of Undesired Fertility.  The various methods of treatment have been discussed with the patient and family. Patient was taken to the Operating Room prior to preprocedure assessment.  Patient had received 1 dose of Versed but had not yet been intubated. Patient able to give verbal consent for her procedure at that time.  Then proceeded to speak with patient's husband regarding recent events and also obtained verbal consent from as patient was under influence of medications. Ok to proceed with procedure at this time.    Hildred Laser, MD [facility] at [facility]

## 2022-11-04 NOTE — Anesthesia Procedure Notes (Signed)
Procedure Name: Intubation Date/Time: 11/04/2022 11:37 AM  Performed by: Malva Cogan, CRNAPre-anesthesia Checklist: Patient identified, Patient being monitored, Timeout performed, Emergency Drugs available and Suction available Patient Re-evaluated:Patient Re-evaluated prior to induction Oxygen Delivery Method: Circle system utilized Preoxygenation: Pre-oxygenation with 100% oxygen Induction Type: IV induction Ventilation: Mask ventilation without difficulty Laryngoscope Size: 3 and McGraph Grade View: Grade I Tube type: Oral Tube size: 6.5 mm Number of attempts: 1 Airway Equipment and Method: Stylet and Video-laryngoscopy Placement Confirmation: ETT inserted through vocal cords under direct vision, positive ETCO2 and breath sounds checked- equal and bilateral Secured at: 23 cm Tube secured with: Tape Dental Injury: Teeth and Oropharynx as per pre-operative assessment

## 2022-11-04 NOTE — Anesthesia Postprocedure Evaluation (Signed)
Anesthesia Post Note  Patient: Patricia Mcbride  Procedure(s) Performed: LAPAROSCOPIC BILATERAL TUBAL LIGATION (Bilateral)  Patient location during evaluation: PACU Anesthesia Type: General Level of consciousness: awake and alert, oriented and patient cooperative Pain management: pain level controlled Vital Signs Assessment: post-procedure vital signs reviewed and stable Respiratory status: spontaneous breathing, nonlabored ventilation and respiratory function stable Cardiovascular status: blood pressure returned to baseline and stable Postop Assessment: adequate PO intake Anesthetic complications: no   No notable events documented.   Last Vitals:  Vitals:   11/04/22 1350 11/04/22 1355  BP:    Pulse: 88 87  Resp: 19 15  Temp: (!) 36.2 C   SpO2: 96% 100%    Last Pain:  Vitals:   11/04/22 1350  TempSrc:   PainSc: 8                  Reed Breech

## 2022-11-05 ENCOUNTER — Encounter: Payer: Self-pay | Admitting: Obstetrics and Gynecology

## 2022-11-14 ENCOUNTER — Telehealth: Payer: Self-pay

## 2022-11-14 NOTE — Telephone Encounter (Signed)
Patient called to see if it is normal for her to have an abnormal cycle after having a   LAPAROSCOPIC BILATERAL TUBAL LIGATION  Her cycle is unlike any cycle that she has had before. She has gushing blood, cycle is heavy and tissue is coming out. She denies cramping. Incisions are doing well. I recommended that she take 800 mg of Ibuprofen 3 times a day to see if bleeding would slow down. She verbalized understanding. Please advise.

## 2022-11-15 NOTE — Telephone Encounter (Signed)
The first cycle after a tubal ligation can be heavier than normal.  Please let Patricia Mcbride know by the end of the day today if her bleeding has not slowed down, and Provera 10 mg daily x 10 days can be called in for her

## 2022-11-18 ENCOUNTER — Encounter: Payer: Self-pay | Admitting: Nurse Practitioner

## 2022-11-18 NOTE — Progress Notes (Unsigned)
    OBSTETRICS/GYNECOLOGY POST-OPERATIVE CLINIC VISIT  Subjective:     Patricia Mcbride is a 42 y.o. female who presents to the clinic 1 weeks status post LAPAROSCOPIC BILATERAL TUBAL LIGATION and ADHESIOLYSIS  for  Undesired Fertility . Eating a regular diet without difficulty. Bowel movements are normal. The patient is not having any pain.  Notes initially after surgery she was having heavier than normal vaginal bleeding, however taking Motrin scheduled for several days as instructed, bleeding resolved.   The following portions of the patient's history were reviewed and updated as appropriate: allergies, current medications, past family history, past medical history, past social history, past surgical history, and problem list.  Review of Systems Pertinent items are noted in HPI.   Objective:   BP (!) 131/54   Pulse 87   Resp 16   Ht 5\' 2"  (1.575 m)   Wt 247 lb 6.4 oz (112.2 kg)   LMP 10/11/2022 (Exact Date)   BMI 45.25 kg/m  Body mass index is 45.25 kg/m.  General:  alert and no distress  Abdomen: soft, bowel sounds active, non-tender  Incision:   healing well, no drainage, no erythema, no hernia, no seroma, no swelling, no dehiscence, incision well approximated    Pathology:  None  Assessment:   Patient s/p LAPAROSCOPIC BILATERAL TUBAL LIGATION with ADHESIOLYSIS   Doing well postoperatively.   Plan:   1. Continue any current medications as instructed by provider. 2. Wound care discussed. 3. Operative findings again reviewed. Pathology report discussed. 4. Activity restrictions: none 5. Anticipated return to work: now, if applicable. 6. Follow up: Return to clinic for any scheduled appointments or for any gynecologic concerns as needed.     Hildred Laser, MD [facility]

## 2022-11-20 ENCOUNTER — Encounter: Payer: Self-pay | Admitting: Obstetrics and Gynecology

## 2022-11-20 ENCOUNTER — Ambulatory Visit: Payer: Medicaid Other | Admitting: Obstetrics and Gynecology

## 2022-11-20 VITALS — BP 131/54 | HR 87 | Resp 16 | Ht 62.0 in | Wt 247.4 lb

## 2022-11-20 DIAGNOSIS — Z4889 Encounter for other specified surgical aftercare: Secondary | ICD-10-CM

## 2022-11-20 DIAGNOSIS — N736 Female pelvic peritoneal adhesions (postinfective): Secondary | ICD-10-CM

## 2022-11-20 DIAGNOSIS — Z9851 Tubal ligation status: Secondary | ICD-10-CM

## 2022-12-26 ENCOUNTER — Encounter: Payer: Self-pay | Admitting: Nurse Practitioner

## 2022-12-26 ENCOUNTER — Ambulatory Visit: Payer: Medicaid Other | Admitting: Nurse Practitioner

## 2022-12-26 VITALS — BP 126/68 | HR 97 | Temp 97.6°F | Resp 16 | Ht 62.0 in | Wt 248.2 lb

## 2022-12-26 DIAGNOSIS — E782 Mixed hyperlipidemia: Secondary | ICD-10-CM | POA: Diagnosis not present

## 2022-12-26 DIAGNOSIS — F330 Major depressive disorder, recurrent, mild: Secondary | ICD-10-CM

## 2022-12-26 DIAGNOSIS — Z23 Encounter for immunization: Secondary | ICD-10-CM | POA: Diagnosis not present

## 2022-12-26 DIAGNOSIS — Z7984 Long term (current) use of oral hypoglycemic drugs: Secondary | ICD-10-CM | POA: Diagnosis not present

## 2022-12-26 DIAGNOSIS — Z7985 Long-term (current) use of injectable non-insulin antidiabetic drugs: Secondary | ICD-10-CM | POA: Diagnosis not present

## 2022-12-26 DIAGNOSIS — E1165 Type 2 diabetes mellitus with hyperglycemia: Secondary | ICD-10-CM

## 2022-12-26 DIAGNOSIS — K219 Gastro-esophageal reflux disease without esophagitis: Secondary | ICD-10-CM

## 2022-12-26 LAB — POCT GLYCOSYLATED HEMOGLOBIN (HGB A1C): Hemoglobin A1C: 5.8 % — AB (ref 4.0–5.6)

## 2022-12-26 MED ORDER — SEMAGLUTIDE (1 MG/DOSE) 4 MG/3ML ~~LOC~~ SOPN: 1.0000 mg | PEN_INJECTOR | SUBCUTANEOUS | 1 refills | Status: DC

## 2022-12-26 MED ORDER — BUPROPION HCL ER (XL) 150 MG PO TB24
150.0000 mg | ORAL_TABLET | Freq: Every day | ORAL | 0 refills | Status: DC
Start: 2022-12-26 — End: 2023-01-27

## 2022-12-26 NOTE — Assessment & Plan Note (Signed)
Comorbidities include HLD, DM, GERD

## 2022-12-26 NOTE — Progress Notes (Signed)
BP 126/68 (BP Location: Right Arm, Patient Position: Sitting, Cuff Size: Large)   Pulse 97   Temp 97.6 F (36.4 C) (Oral)   Resp 16   Ht 5\' 2"  (1.575 m) Comment: per patient  Wt 248 lb 3.2 oz (112.6 kg)   SpO2 100%   Breastfeeding No   BMI 45.40 kg/m    Subjective:    Patient ID: Patricia Mcbride, female    DOB: [birth year], 42 y.o.   MRN: [id number]  HPI: Patricia Mcbride is a 42 y.o. female  Chief Complaint  Patient presents with   Follow-up   Discussed the use of AI scribe software for clinical note transcription with the patient, who gave verbal consent to proceed.  History of Present Illness   The patient, with a history of diabetes, high cholesterol, and acid reflux, presents with symptoms of low blood sugar including feeling woozy, tired, hot, and sweaty. The patient reports these symptoms occur when she does not eat regularly. The patient also reports feeling tired and unmotivated, with little desire to leave the house. The patient has been managing her diabetes with Ozempic 1 mg weekly and metformin 500 mg twice daily, but reports experiencing diarrhea after taking Ozempic. The patient's last A1c was 5.8, down from 6.6. The patient's weight is 248 pounds with a BMI of 45.40. The patient's last LDL was 119 and she is not currently on cholesterol-lowering medication. The patient recently had a tubal ligation.       12/26/2022    8:36 AM 08/26/2022   11:29 AM 04/26/2022    8:10 AM 03/07/2022    8:47 AM 01/30/2022   10:05 AM  Depression screen PHQ 2/9  Decreased Interest 1 1 0 0 0  Down, Depressed, Hopeless 1 1 0 0 0  PHQ - 2 Score 2 2 0 0 0  Altered sleeping 0 0 0 0 0  Tired, decreased energy 1 1 1 1  0  Change in appetite 1 1 0 0 0  Feeling bad or failure about yourself  1 1 0 0 0  Trouble concentrating 1 0 0 0 0  Moving slowly or fidgety/restless 1 0 0  0  Suicidal thoughts 0 0 0 0 0  PHQ-9 Score 7 5 1 1  0  Difficult doing work/chores Somewhat difficult Not  difficult at all  Not difficult at all        12/26/2022    8:37 AM 08/26/2022   11:29 AM 04/26/2022    8:11 AM 03/07/2022    8:48 AM  GAD 7 : Generalized Anxiety Score  Nervous, Anxious, on Edge 1 1 1  0  Control/stop worrying 1 1 1  0  Worry too much - different things 1 1 1  0  Trouble relaxing 1 1 0 0  Restless 1 0 0 0  Easily annoyed or irritable 1 1 0 0  Afraid - awful might happen 1 1 0 0  Total GAD 7 Score 7 6 3  0  Anxiety Difficulty Somewhat difficult Somewhat difficult Not difficult at all Not difficult at all    Lipid Panel     Component Value Date/Time   CHOL 189 08/26/2022 1213   TRIG 229 (H) 08/26/2022 1213   HDL 35 (L) 08/26/2022 1213   CHOLHDL 5.4 (H) 08/26/2022 1213   VLDL 40.2 (H) 11/18/2018 1631   LDLCALC 119 (H) 08/26/2022 1213   LDLDIRECT 141.0 11/18/2018 1631    Relevant past medical, surgical, family and social history reviewed and updated  as indicated. Interim medical history since our last visit reviewed. Allergies and medications reviewed and updated.  Review of Systems  Constitutional: Negative for fever or weight change.  Respiratory: Negative for cough and shortness of breath.   Cardiovascular: Negative for chest pain or palpitations.  Gastrointestinal: Negative for abdominal pain, no bowel changes.  Musculoskeletal: Negative for gait problem or joint swelling.  Skin: Negative for rash.  Neurological: Negative for dizziness or headache.  No other specific complaints in a complete review of systems (except as listed in HPI above).      Objective:    BP 126/68 (BP Location: Right Arm, Patient Position: Sitting, Cuff Size: Large)   Pulse 97   Temp 97.6 F (36.4 C) (Oral)   Resp 16   Ht 5\' 2"  (1.575 m) Comment: per patient  Wt 248 lb 3.2 oz (112.6 kg)   SpO2 100%   Breastfeeding No   BMI 45.40 kg/m   Wt Readings from Last 3 Encounters:  12/26/22 248 lb 3.2 oz (112.6 kg)  11/20/22 247 lb 6.4 oz (112.2 kg)  11/04/22 250 lb (113.4 kg)     Physical Exam  Constitutional: Patient appears well-developed and well-nourished. Obese  No distress.  HEENT: head atraumatic, normocephalic, pupils equal and reactive to light, neck supple Cardiovascular: Normal rate, regular rhythm and normal heart sounds.  No murmur heard. No BLE edema. Pulmonary/Chest: Effort normal and breath sounds normal. No respiratory distress. Abdominal: Soft.  There is no tenderness. Psychiatric: Patient has a normal mood and affect. behavior is normal. Judgment and thought content normal.    Results for orders placed or performed in visit on 12/26/22  POCT HgB A1C  Result Value Ref Range   Hemoglobin A1C 5.8 (A) 4.0 - 5.6 %   HbA1c POC (<> result, manual entry)     HbA1c, POC (prediabetic range)     HbA1c, POC (controlled diabetic range)        Assessment & Plan:   Problem List Items Addressed This Visit       Digestive   Gastroesophageal reflux disease without esophagitis     Endocrine   Type 2 diabetes mellitus with hyperglycemia, without long-term current use of insulin (HCC) - Primary   Relevant Medications   Semaglutide, 1 MG/DOSE, 4 MG/3ML SOPN   Other Relevant Orders   POCT HgB A1C (Completed)     Other   Morbid obesity (HCC)    Comorbidities include HLD, DM, GERD      Relevant Medications   Semaglutide, 1 MG/DOSE, 4 MG/3ML SOPN   Mild episode of recurrent major depressive disorder (HCC)   Relevant Medications   buPROPion (WELLBUTRIN XL) 150 MG 24 hr tablet   Mixed hyperlipidemia   Other Visit Diagnoses     Need for immunization against influenza       Relevant Orders   Flu vaccine trivalent PF, 6mos and older(Flulaval,Afluria,Fluarix,Fluzone) (Completed)        Assessment and Plan    Type 2 Diabetes Mellitus Improved control with A1c of 5.8, down from 6.6. Patient reports hypoglycemic symptoms, likely secondary to inconsistent eating habits. -Discontinue Metformin 500mg  BID. -Continue Ozempic 1mg  weekly. -Recheck A1c  at next regular follow-up.  Hyperlipidemia Last LDL was 119, not currently on cholesterol-lowering medication. -Plan to recheck cholesterol at next visit. -Calculate risk score at next visit to determine need for cholesterol-lowering medication.  Depression Patient reports lack of motivation and energy, despite being on Celexa 20mg  BID. -Add Wellbutrin to regimen. -Follow-up in 4  weeks to assess response.  Gastroesophageal Reflux Disease Well controlled on Famotidine 20mg  daily. -Continue current regimen.  General Health Maintenance -Administer influenza vaccine today. -Continue monitoring weight (current weight 248 pounds, BMI 45.40).         Follow up plan: Return in about 4 weeks (around 01/23/2023) for follow up.

## 2023-01-23 ENCOUNTER — Ambulatory Visit: Payer: Medicaid Other | Admitting: Nurse Practitioner

## 2023-01-23 NOTE — Progress Notes (Deleted)
 There were no vitals taken for this visit.   Subjective:    Patient ID: Patricia Mcbride, female    DOB: 06-18-1980, 43 y.o.   MRN: [id number]  HPI: Patricia Mcbride is a 43 y.o. female  No chief complaint on file.   Discussed the use of AI scribe software for clinical note transcription with the patient, who gave verbal consent to proceed.  History of Present Illness           12/26/2022    8:36 AM 08/26/2022   11:29 AM 04/26/2022    8:10 AM  Depression screen PHQ 2/9  Decreased Interest 1 1 0  Down, Depressed, Hopeless 1 1 0  PHQ - 2 Score 2 2 0  Altered sleeping 0 0 0  Tired, decreased energy 1 1 1   Change in appetite 1 1 0  Feeling bad or failure about yourself  1 1 0  Trouble concentrating 1 0 0  Moving slowly or fidgety/restless 1 0 0  Suicidal thoughts 0 0 0  PHQ-9 Score 7 5 1   Difficult doing work/chores Somewhat difficult Not difficult at all        12/26/2022    8:37 AM 08/26/2022   11:29 AM 04/26/2022    8:11 AM 03/07/2022    8:48 AM  GAD 7 : Generalized Anxiety Score  Nervous, Anxious, on Edge 1 1 1  0  Control/stop worrying 1 1 1  0  Worry too much - different things 1 1 1  0  Trouble relaxing 1 1 0 0  Restless 1 0 0 0  Easily annoyed or irritable 1 1 0 0  Afraid - awful might happen 1 1 0 0  Total GAD 7 Score 7 6 3  0  Anxiety Difficulty Somewhat difficult Somewhat difficult Not difficult at all Not difficult at all     Relevant past medical, surgical, family and social history reviewed and updated as indicated. Interim medical history since our last visit reviewed. Allergies and medications reviewed and updated.  Review of Systems  Constitutional: Negative for fever or weight change.  Respiratory: Negative for cough and shortness of breath.   Cardiovascular: Negative for chest pain or palpitations.  Gastrointestinal: Negative for abdominal pain, no bowel changes.  Musculoskeletal: Negative for gait problem or joint swelling.  Skin: Negative  for rash.  Neurological: Negative for dizziness or headache.  No other specific complaints in a complete review of systems (except as listed in HPI above).      Objective:    There were no vitals taken for this visit.  {Vitals History (Optional):23777} Wt Readings from Last 3 Encounters:  12/26/22 248 lb 3.2 oz (112.6 kg)  11/20/22 247 lb 6.4 oz (112.2 kg)  11/04/22 250 lb (113.4 kg)    Physical Exam  Constitutional: Patient appears well-developed and well-nourished. Obese *** No distress.  HEENT: head atraumatic, normocephalic, pupils equal and reactive to light, ears ***, neck supple, throat within normal limits Cardiovascular: Normal rate, regular rhythm and normal heart sounds.  No murmur heard. No BLE edema. Pulmonary/Chest: Effort normal and breath sounds normal. No respiratory distress. Abdominal: Soft.  There is no tenderness. Psychiatric: Patient has a normal mood and affect. behavior is normal. Judgment and thought content normal.  Results for orders placed or performed in visit on 12/26/22  POCT HgB A1C   Collection Time: 12/26/22  8:36 AM  Result Value Ref Range   Hemoglobin A1C 5.8 (A) 4.0 - 5.6 %   HbA1c POC (<>  result, manual entry)     HbA1c, POC (prediabetic range)     HbA1c, POC (controlled diabetic range)     {Labs (Optional):23779}    Assessment & Plan:   Problem List Items Addressed This Visit   None    Assessment and Plan             Follow up plan: No follow-ups on file.

## 2023-01-26 ENCOUNTER — Encounter: Payer: Self-pay | Admitting: Nurse Practitioner

## 2023-01-27 ENCOUNTER — Other Ambulatory Visit: Payer: Self-pay | Admitting: Nurse Practitioner

## 2023-01-27 DIAGNOSIS — F330 Major depressive disorder, recurrent, mild: Secondary | ICD-10-CM

## 2023-01-27 DIAGNOSIS — E1165 Type 2 diabetes mellitus with hyperglycemia: Secondary | ICD-10-CM

## 2023-01-27 MED ORDER — SEMAGLUTIDE (1 MG/DOSE) 4 MG/3ML ~~LOC~~ SOPN: 1.0000 mg | PEN_INJECTOR | SUBCUTANEOUS | 1 refills | Status: DC

## 2023-01-27 MED ORDER — BUPROPION HCL ER (XL) 150 MG PO TB24
150.0000 mg | ORAL_TABLET | Freq: Every day | ORAL | 1 refills | Status: DC
Start: 2023-01-27 — End: 2023-07-24

## 2023-03-01 ENCOUNTER — Other Ambulatory Visit: Payer: Self-pay | Admitting: Nurse Practitioner

## 2023-03-01 DIAGNOSIS — E1165 Type 2 diabetes mellitus with hyperglycemia: Secondary | ICD-10-CM

## 2023-03-03 NOTE — Telephone Encounter (Signed)
 Requested Prescriptions  Pending Prescriptions Disp Refills   Semaglutide , 1 MG/DOSE, (OZEMPIC , 1 MG/DOSE,) 4 MG/3ML SOPN [Pharmacy Med Name: OZEMPIC  1MG  PER DOSE (4MG /3ML) PFP] 3 mL 0    Sig: INJECT 1 MG UNDER THE SKIN AS DIRECTED ONCE A WEEK     Endocrinology:  Diabetes - GLP-1 Receptor Agonists - semaglutide  Failed - 03/03/2023  1:35 PM      Failed - HBA1C in normal range and within 180 days    Hemoglobin A1C  Date Value Ref Range Status  12/26/2022 5.8 (A) 4.0 - 5.6 % Final   Hgb A1c MFr Bld  Date Value Ref Range Status  08/26/2022 6.4 (H) <5.7 % of total Hgb Final    Comment:    For someone without known diabetes, a hemoglobin  A1c value between 5.7% and 6.4% is consistent with prediabetes and should be confirmed with a  follow-up test. . For someone with known diabetes, a value <7% indicates that their diabetes is well controlled. A1c targets should be individualized based on duration of diabetes, age, comorbid conditions, and other considerations. . This assay result is consistent with an increased risk of diabetes. . Currently, no consensus exists regarding use of hemoglobin A1c for diagnosis of diabetes for children. .          Passed - Cr in normal range and within 360 days    Creat  Date Value Ref Range Status  08/26/2022 0.75 0.50 - 0.97 mg/dL Final   Creatinine, Urine  Date Value Ref Range Status  03/28/2022 229 mg/dL Final         Passed - Valid encounter within last 6 months    Recent Outpatient Visits           2 months ago Type 2 diabetes mellitus with hyperglycemia, without long-term current use of insulin  [facility])   [facility] Donny Gall F, FNP   6 months ago Mild episode of recurrent major depressive disorder [facility])   [facility] Donny Gall F, FNP   10 months ago Type 2 diabetes mellitus with hyperglycemia, without long-term current use of insulin  [facility])   [facility] Quinton Buckler, FNP   1 year ago Positive pregnancy test   [facility] Donny Gall F, FNP   1 year ago Major depressive disorder, recurrent episode with anxious distress [facility])   [facility] Quinton Buckler, [place]

## 2023-04-16 ENCOUNTER — Ambulatory Visit: Admitting: Nurse Practitioner

## 2023-04-16 ENCOUNTER — Encounter: Payer: Self-pay | Admitting: Nurse Practitioner

## 2023-04-16 VITALS — BP 132/88 | HR 101 | Resp 16 | Ht 62.0 in | Wt 251.7 lb

## 2023-04-16 DIAGNOSIS — M79672 Pain in left foot: Secondary | ICD-10-CM
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
## 2023-04-18 ENCOUNTER — Ambulatory Visit: Payer: Self-pay | Admitting: Podiatry

## 2023-04-18 ENCOUNTER — Encounter: Payer: Self-pay | Admitting: Podiatry

## 2023-04-18 ENCOUNTER — Ambulatory Visit

## 2023-04-18 DIAGNOSIS — M722 Plantar fascial fibromatosis: Secondary | ICD-10-CM

## 2023-04-18 DIAGNOSIS — G8929 Other chronic pain: Secondary | ICD-10-CM

## 2023-04-18 MED ORDER — METHYLPREDNISOLONE 4 MG PO TBPK: ORAL_TABLET | ORAL | 0 refills | Status: DC

## 2023-04-18 MED ORDER — MELOXICAM 15 MG PO TABS: 15.0000 mg | ORAL_TABLET | Freq: Every day | ORAL | 1 refills | Status: DC

## 2023-04-18 MED ORDER — BETAMETHASONE SOD PHOS & ACET 6 (3-3) MG/ML IJ SUSP
3.0000 mg | Freq: Once | INTRAMUSCULAR | Status: AC
  Administered 2023-04-18: 3 mg via INTRA_ARTICULAR
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
## 2023-05-20 ENCOUNTER — Ambulatory Visit: Admitting: Podiatry

## 2023-05-20 ENCOUNTER — Encounter: Payer: Self-pay | Admitting: Podiatry

## 2023-05-20 DIAGNOSIS — M722 Plantar fascial fibromatosis: Secondary | ICD-10-CM | POA: Diagnosis not present

## 2023-05-20 DIAGNOSIS — M62462 Contracture of muscle, left lower leg: Secondary | ICD-10-CM | POA: Diagnosis not present
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
## 2023-05-22 ENCOUNTER — Other Ambulatory Visit: Payer: Self-pay | Admitting: Nurse Practitioner

## 2023-05-22 DIAGNOSIS — F330 Major depressive disorder, recurrent, mild: Secondary | ICD-10-CM

## 2023-05-25 NOTE — Telephone Encounter (Signed)
 OV 12/5,3/26(acute), appointment scheduled Requested Prescriptions  Pending Prescriptions Disp Refills   citalopram  (CELEXA ) 20 MG tablet [Pharmacy Med Name: CITALOPRAM  20MG  TABLETS] 180 tablet     Sig: TAKE 1 TABLET(20 MG) BY MOUTH TWICE DAILY     Psychiatry:  Antidepressants - SSRI Failed - 05/25/2023 11:53 AM      Failed - Valid encounter within last 6 months    Recent Outpatient Visits           1 month ago Pain of left heel   [facility] Quinton Buckler, [place]              Passed - Completed PHQ-2 or PHQ-9 in the last 360 days

## 2023-05-27 ENCOUNTER — Other Ambulatory Visit: Payer: Self-pay

## 2023-05-27 ENCOUNTER — Encounter: Payer: Self-pay | Admitting: Nurse Practitioner

## 2023-05-27 DIAGNOSIS — E1165 Type 2 diabetes mellitus with hyperglycemia: Secondary | ICD-10-CM

## 2023-05-27 MED ORDER — OZEMPIC (1 MG/DOSE) 4 MG/3ML ~~LOC~~ SOPN: 1.0000 mg | PEN_INJECTOR | SUBCUTANEOUS | 3 refills | Status: DC

## 2023-06-17 ENCOUNTER — Ambulatory Visit: Admitting: Podiatry

## 2023-06-17 DIAGNOSIS — M722 Plantar fascial fibromatosis: Secondary | ICD-10-CM

## 2023-06-17 DIAGNOSIS — M7662 Achilles tendinitis, left leg: Secondary | ICD-10-CM

## 2023-06-17 DIAGNOSIS — M7732 Calcaneal spur, left foot: Secondary | ICD-10-CM
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
## 2023-06-18 ENCOUNTER — Telehealth: Payer: Self-pay | Admitting: Podiatry

## 2023-06-18 NOTE — Telephone Encounter (Signed)
 Received surgical consent form.  Called pt to get scheduled and she had a question about going back to work. She said Dr Luster Salters said she would need to take Friday after surgery but could she go back to work on Monday.  I asked and Dr Luster Salters said he would really like her to be off a week until he sees her. Unless she could work from home.  She chose 6/26 as surgery date due to needing to get the time off of work. I did verify the pharmacy and she is on a glp 1 and I told her she has to be off of that  a week prior to surgery. She is to look over the list just to confirm she is not on any of the other medications. She is aware to Michiels if any further questions. I will get the knee scooter ordered and she is also aware about that .

## 2023-06-19 ENCOUNTER — Telehealth: Payer: Self-pay | Admitting: Podiatry

## 2023-06-19 NOTE — Telephone Encounter (Signed)
 DOS: 07/17/23  (LT) ENDOSCOPIC PLANTAR FASCIOTOMY -[address] (LT) CALCANEAL OSTECTOMY(POSTERIOR) -60[address] (LT) ACHILLES TENDON [id number]     EFFECTIVE DATE :  07/22/2022    PER JANINE H OF [employer] NO PRIOR AUTH IS REQ FOR CPT CODES [id number]  REF# I- [id number]

## 2023-06-22 ENCOUNTER — Other Ambulatory Visit: Payer: Self-pay | Admitting: Nurse Practitioner

## 2023-06-24 NOTE — Telephone Encounter (Signed)
 Requested medications are due for refill today.  unsure  Requested medications are on the active medications list.  yes  Last refill. 04/16/2023  Future visit scheduled.   yes  Notes to clinic.  Medication is historical.    Requested Prescriptions  Pending Prescriptions Disp Refills   metFORMIN  (GLUCOPHAGE ) 500 MG tablet [Pharmacy Med Name: METFORMIN  500MG  TABLETS] 180 tablet     Sig: TAKE 1 TABLET(500 MG) BY MOUTH TWICE DAILY WITH A MEAL     Endocrinology:  Diabetes - Biguanides Failed - 06/24/2023  9:08 AM      Failed - B12 Level in normal range and within 720 days    No results found for: "VITAMINB12"       Failed - Valid encounter within last 6 months    Recent Outpatient Visits           2 months ago Pain of left heel   [facility] Qu[place] Buckler, FNP              Passed - Cr in normal range and within 360 days    Creat  Date Value Ref Range Status  08/26/2022 0.75 0.50 - 0.97 mg/dL Final   Creatinine, Urine  Date Value Ref Range Status  03/28/2022 229 mg/dL Final         Passed - HBA1C is between 0 and 7.9 and within 180 days    Hemoglobin A1C  Date Value Ref Range Status  12/26/2022 5.8 (A) 4.0 - 5.6 % Final   Hgb A1c MFr Bld  Date Value Ref Range Status  08/26/2022 6.4 (H) <5.7 % of total Hgb Final    Comment:    For someone without known diabetes, a hemoglobin  A1c value between 5.7% and 6.4% is consistent with prediabetes and should be confirmed with a  follow-up test. . For someone with known diabetes, a value <7% indicates that their diabetes is well controlled. A1c targets should be individualized based on duration of diabetes, age, comorbid conditions, and other considerations. . This assay result is consistent with an increased risk of diabetes. . Currently, no consensus exists regarding use of hemoglobin A1c for diagnosis of diabetes for children. .          Passed - eGFR in normal range and within 360  days    GFR, Estimated  Date Value Ref Range Status  04/01/2022 >60 >60 mL/min Final    Comment:    (NOTE) Calculated using the CKD-EPI Creatinine Equation (2021)    GFR  Date Value Ref Range Status  11/18/2018 83.63 >60.00 mL/min Final   eGFR  Date Value Ref Range Status  08/26/2022 104 > OR = 60 mL/min/1.74m2 Final         Passed - CBC within normal limits and completed in the last 12 months    WBC  Date Value Ref Range Status  10/29/2022 11.5 (H) 4.0 - 10.5 K/uL Final   RBC  Date Value Ref Range Status  10/29/2022 4.61 3.87 - 5.11 MIL/uL Final   Hemoglobin  Date Value Ref Range Status  10/29/2022 12.2 12.0 - 15.0 g/dL Final  16/10/9602 54.0 11.1 - 15.9 g/dL Final   HCT  Date Value Ref Range Status  10/29/2022 37.9 36.0 - 46.0 % Final   Hematocrit  Date Value Ref Range Status  11/02/2021 37.4 34.0 - 46.6 % Final   MCHC  Date Value Ref Range Status  10/29/2022 32.2 30.0 - 36.0 g/dL Final  [facility]  Date Value Ref Range Status  10/29/2022 26.5 26.0 - 34.0 pg Final   MCV  Date Value Ref Range Status  10/29/2022 82.2 80.0 - 100.0 fL Final  11/02/2021 86 79 - 97 fL Final   No results found for: "PLTCOUNTKUC", "LABPLAT", "POCPLA" RDW  Date Value Ref Range Status  10/29/2022 13.5 11.5 - 15.5 % Final  11/02/2021 14.1 11.7 - 15.4 % Final

## 2023-06-30 ENCOUNTER — Other Ambulatory Visit: Payer: Self-pay | Admitting: Nurse Practitioner

## 2023-06-30 DIAGNOSIS — F330 Major depressive disorder, recurrent, mild: Secondary | ICD-10-CM

## 2023-07-01 NOTE — Telephone Encounter (Signed)
 Requested Prescriptions  Pending Prescriptions Disp Refills   citalopram  (CELEXA ) 20 MG tablet [Pharmacy Med Name: CITALOPRAM  20MG  TABLETS] 60 tablet 0    Sig: TAKE 1 TABLET(20 MG) BY MOUTH TWICE DAILY     Psychiatry:  Antidepressants - SSRI Failed - 07/01/2023 11:29 AM      Failed - Valid encounter within last 6 months    Recent Outpatient Visits           2 months ago Pain of left heel   [facility] Quinton Buckler, [place]              Passed - Completed PHQ-2 or PHQ-9 in the last 360 days

## 2023-07-12 ENCOUNTER — Other Ambulatory Visit: Payer: Self-pay | Admitting: Nurse Practitioner

## 2023-07-12 DIAGNOSIS — F330 Major depressive disorder, recurrent, mild: Secondary | ICD-10-CM

## 2023-07-15 NOTE — Telephone Encounter (Signed)
 Requested by interface surescripts. Last refill documented 05/04/23. Too soon for refill  Future visit on 07/17/23.  Requested Prescriptions  Refused Prescriptions Disp Refills   buPROPion  (WELLBUTRIN  XL) 150 MG 24 hr tablet [Pharmacy Med Name: BUPROPION  XL 150MG  TABLETS (24 H)] 90 tablet 1    Sig: TAKE 1 TABLET(150 MG) BY MOUTH DAILY     Psychiatry: Antidepressants - bupropion  Failed - 07/15/2023 12:07 PM      Failed - Valid encounter within last 6 months    Recent Outpatient Visits           3 months ago Pain of left heel   [facility] Gareth Mliss F[place], FNP              Passed - Cr in normal range and within 360 days    Creat  Date Value Ref Range Status  08/26/2022 0.75 0.50 - 0.97 mg/dL Final   Creatinine, Urine  Date Value Ref Range Status  03/28/2022 229 mg/dL Final         Passed - AST in normal range and within 360 days    AST  Date Value Ref Range Status  08/26/2022 12 10 - 30 U/L Final         Passed - ALT in normal range and within 360 days    ALT  Date Value Ref Range Status  08/26/2022 15 6 - 29 U/L Final         Passed - Completed PHQ-2 or PHQ-9 in the last 360 days      Passed - Last BP in normal range    BP Readings from Last 1 Encounters:  04/16/23 132/88

## 2023-07-17 ENCOUNTER — Ambulatory Visit: Admitting: Nurse Practitioner

## 2023-07-17 ENCOUNTER — Other Ambulatory Visit: Payer: Self-pay | Admitting: Podiatry

## 2023-07-17 DIAGNOSIS — G8918 Other acute postprocedural pain: Secondary | ICD-10-CM | POA: Diagnosis not present

## 2023-07-17 DIAGNOSIS — M216X2 Other acquired deformities of left foot: Secondary | ICD-10-CM | POA: Diagnosis not present

## 2023-07-17 DIAGNOSIS — M7732 Calcaneal spur, left foot: Secondary | ICD-10-CM | POA: Diagnosis not present

## 2023-07-17 DIAGNOSIS — M722 Plantar fascial fibromatosis: Secondary | ICD-10-CM | POA: Diagnosis not present

## 2023-07-17 DIAGNOSIS — M7662 Achilles tendinitis, left leg: Secondary | ICD-10-CM | POA: Diagnosis not present

## 2023-07-17 MED ORDER — OXYCODONE-ACETAMINOPHEN 5-325 MG PO TABS: 1.0000 | ORAL_TABLET | ORAL | 0 refills | Status: DC | PRN

## 2023-07-17 MED ORDER — IBUPROFEN 800 MG PO TABS: 800.0000 mg | ORAL_TABLET | Freq: Three times a day (TID) | ORAL | 1 refills | Status: DC

## 2023-07-17 NOTE — Progress Notes (Signed)
 PRN postop

## 2023-07-22 ENCOUNTER — Encounter: Payer: Self-pay | Admitting: Podiatry

## 2023-07-22 ENCOUNTER — Ambulatory Visit (INDEPENDENT_AMBULATORY_CARE_PROVIDER_SITE_OTHER)

## 2023-07-22 ENCOUNTER — Ambulatory Visit (INDEPENDENT_AMBULATORY_CARE_PROVIDER_SITE_OTHER): Admitting: Podiatry

## 2023-07-22 VITALS — Ht 62.0 in | Wt 251.7 lb

## 2023-07-22 DIAGNOSIS — M7732 Calcaneal spur, left foot: Secondary | ICD-10-CM

## 2023-07-22 DIAGNOSIS — Z9889 Other specified postprocedural states: Secondary | ICD-10-CM
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
## 2023-07-24 ENCOUNTER — Other Ambulatory Visit (HOSPITAL_COMMUNITY)
Admission: RE | Admit: 2023-07-24 | Discharge: 2023-07-24 | Disposition: A | Discharge status: Home / Self Care | Source: Ambulatory Visit | Attending: Nurse Practitioner | Admitting: Nurse Practitioner

## 2023-07-24 ENCOUNTER — Ambulatory Visit: Admitting: Nurse Practitioner

## 2023-07-24 ENCOUNTER — Encounter: Payer: Self-pay | Admitting: Nurse Practitioner

## 2023-07-24 VITALS — BP 136/84 | HR 100 | Temp 98.8°F | Resp 18 | Ht 62.0 in | Wt 259.7 lb

## 2023-07-24 DIAGNOSIS — K219 Gastro-esophageal reflux disease without esophagitis: Secondary | ICD-10-CM | POA: Diagnosis not present

## 2023-07-24 DIAGNOSIS — F330 Major depressive disorder, recurrent, mild: Secondary | ICD-10-CM | POA: Diagnosis not present

## 2023-07-24 DIAGNOSIS — F419 Anxiety disorder, unspecified: Secondary | ICD-10-CM

## 2023-07-24 DIAGNOSIS — R300 Dysuria: Secondary | ICD-10-CM

## 2023-07-24 DIAGNOSIS — N898 Other specified noninflammatory disorders of vagina: Secondary | ICD-10-CM

## 2023-07-24 DIAGNOSIS — E1165 Type 2 diabetes mellitus with hyperglycemia: Secondary | ICD-10-CM

## 2023-07-24 DIAGNOSIS — E782 Mixed hyperlipidemia: Secondary | ICD-10-CM | POA: Diagnosis not present

## 2023-07-24 DIAGNOSIS — F339 Major depressive disorder, recurrent, unspecified: Secondary | ICD-10-CM

## 2023-07-24 LAB — POCT URINALYSIS DIPSTICK
Bilirubin, UA: NEGATIVE
Blood, UA: NEGATIVE
Glucose, UA: NEGATIVE
Ketones, UA: NEGATIVE
Leukocytes, UA: NEGATIVE
Nitrite, UA: NEGATIVE
Odor: NORMAL
Protein, UA: NEGATIVE
Spec Grav, UA: 1.02 (ref 1.010–1.025)
Urobilinogen, UA: 0.2 U/dL
pH, UA: 6.5 (ref 5.0–8.0)

## 2023-07-24 MED ORDER — OZEMPIC (2 MG/DOSE) 8 MG/3ML ~~LOC~~ SOPN: 2.0000 mg | PEN_INJECTOR | SUBCUTANEOUS | 3 refills | Status: DC

## 2023-07-24 MED ORDER — BUPROPION HCL ER (XL) 150 MG PO TB24: 150.0000 mg | ORAL_TABLET | Freq: Every day | ORAL | 1 refills | Status: DC

## 2023-07-24 MED ORDER — CITALOPRAM HYDROBROMIDE 20 MG PO TABS: 20.0000 mg | ORAL_TABLET | Freq: Every day | ORAL | 1 refills | Status: DC

## 2023-07-24 NOTE — Assessment & Plan Note (Signed)
 Labs ordered- lipid panel. Continue working on lifestyle modifications

## 2023-07-24 NOTE — Assessment & Plan Note (Signed)
 Mother recently passed, patient has had increased stress.  Will continue with current treatment.

## 2023-07-24 NOTE — Assessment & Plan Note (Signed)
 Continue with wellbutrin  and celexa .

## 2023-07-24 NOTE — Assessment & Plan Note (Addendum)
 Ozempic  dose increased from 1mg  to 2mg .  Reports working on diet and lifestyle modifications.

## 2023-07-24 NOTE — Progress Notes (Signed)
 BP 136/84   Pulse 100   Temp 98.8 F (37.1 C)   Resp 18   Ht 5' 2 (1.575 m)   Wt 259 lb 11.2 oz (117.8 kg)   LMP 07/10/2023   BMI 47.50 kg/m    Subjective:    Patient ID: Patricia Mcbride, female    DOB: Nov 08, 1980, 43 y.o.   MRN: [id number]  HPI: Patricia Mcbride is a 43 y.o. female presenting here today for a 3 month follow-up for diabetes.   Type 2 Diabetes Mellitus:  -A1c in 12/24 was 5.8, which is improved from 6.4. We will recheck today  -Ozempic  taking weekly and  reports no side effects. Interested in increasing dose. Will increase to 2mg .  -Diet : reports falling off the wagon since her mom passed away and procedure on left foot. Reports trying to  limit sugar, increasing proteins, and limiting portions.  -Not currently checking blood sugar at home   Exercise: -Reports recent heel spur and had to have surgery. She has not been able to exercise, but would walk 3-4 times a week prior to procedure.    Hyperlipidemia: -Not currently on medication -Checking cholesterol today -Calculated ASCVD score- 2.7% medication not needed at this time.   Anxiety/Depression: -reports needing refill on wellbutrin , tolerating medication well  -taking celexa  and reports needing refill, tolerating medication well -Not interested in counseling/therapy at the moment ` - GERD: -No issues right now but reports taking famotidine for relief when needed.  Yeast Infection: -itching in vaginal area -reports discharge -reports dysuria  -denies odor -Will order urine dip    Health Maintenance:  -Routine labs performed today         04/16/2023   10:00 AM 12/26/2022    8:36 AM 08/26/2022   11:29 AM  Depression screen PHQ 2/9  Decreased Interest 1 1 1   Down, Depressed, Hopeless 1 1 1   PHQ - 2 Score 2 2 2   Altered sleeping 0 0 0  Tired, decreased energy 1 1 1   Change in appetite 1 1 1   Feeling bad or failure about yourself  1 1 1   Trouble concentrating 1 1 0  Moving  slowly or fidgety/restless 0 1 0  Suicidal thoughts 0 0 0  PHQ-9 Score 6 7 5   Difficult doing work/chores Not difficult at all Somewhat difficult Not difficult at all    Relevant past medical, surgical, family and social history reviewed and updated as indicated. Interim medical history since our last visit reviewed. Allergies and medications reviewed and updated.  Review of Systems  Ten systems reviewed and is negative except as mentioned in HPI      Objective:     BP 136/84   Pulse 100   Temp 98.8 F (37.1 C)   Resp 18   Ht 5' 2 (1.575 m)   Wt 259 lb 11.2 oz (117.8 kg)   LMP 07/10/2023   BMI 47.50 kg/m    Wt Readings from Last 3 Encounters:  07/24/23 259 lb 11.2 oz (117.8 kg)  07/22/23 251 lb 11.2 oz (114.2 kg)  04/16/23 251 lb 11.2 oz (114.2 kg)    Physical Exam Constitutional:      Appearance: Normal appearance.  HENT:     Head: Normocephalic and atraumatic.  Cardiovascular:     Rate and Rhythm: Normal rate and regular rhythm.     Pulses: Normal pulses.     Heart sounds: Normal heart sounds.  Pulmonary:     Effort: Pulmonary effort  is normal.     Breath sounds: Normal breath sounds.  Musculoskeletal:     Comments: Left leg cast   Neurological:     General: No focal deficit present.     Mental Status: She is alert and oriented to person, place, and time.  Psychiatric:        Mood and Affect: Mood normal.        Behavior: Behavior normal.        Thought Content: Thought content normal.      Results for orders placed or performed in visit on 07/24/23  POCT urinalysis dipstick   Collection Time: 07/24/23 11:39 AM  Result Value Ref Range   Color, UA yellow    Clarity, UA clear    Glucose, UA Negative Negative   Bilirubin, UA neg    Ketones, UA neg    Spec Grav, UA 1.020 1.010 - 1.025   Blood, UA neg    pH, UA 6.5 5.0 - 8.0   Protein, UA Negative Negative   Urobilinogen, UA 0.2 0.2 or 1.0 E.U./dL   Nitrite, UA neg    Leukocytes, UA Negative  Negative   Appearance clear    Odor normal         The 10-year ASCVD risk score (Arnett DK, et al., 2019) is: 2.7%   Values used to calculate the score:     Age: 33 years     Clincally relevant sex: Female     Is Non-Hispanic African American: No     Diabetic: Yes     Tobacco smoker: No     Systolic Blood Pressure: 136 mmHg     Is BP treated: No     HDL Cholesterol: 35 mg/dL     Total Cholesterol: 189 mg/dL   Assessment & Plan:   Problem List Items Addressed This Visit       Digestive   Gastroesophageal reflux disease without esophagitis   Well controlled. Takes famotidine         Endocrine   Type 2 diabetes mellitus with hyperglycemia, without long-term current use of insulin  (HCC) - Primary   Ozempic  dose increased from 1mg  to 2mg .       Relevant Medications   Semaglutide , 2 MG/DOSE, (OZEMPIC , 2 MG/DOSE,) 8 MG/3ML SOPN   Other Relevant Orders   Hemoglobin A1c   Microalbumin / creatinine urine ratio     Other   Major depressive disorder, recurrent episode with anxious distress (HCC)   Continue with wellbutrin  and celexa . Refills sent in.       Relevant Medications   citalopram  (CELEXA ) 20 MG tablet   buPROPion  (WELLBUTRIN  XL) 150 MG 24 hr tablet   Morbid obesity (HCC)   Ozempic  dose increased from 1mg  to 2mg .  Reports working on diet and lifestyle modifications.       Relevant Medications   Semaglutide , 2 MG/DOSE, (OZEMPIC , 2 MG/DOSE,) 8 MG/3ML SOPN   Other Relevant Orders   CBC with Differential/Platelet   Comprehensive metabolic panel with GFR   Anxiety   Continue with wellbutrin  and celexa .       Relevant Medications   citalopram  (CELEXA ) 20 MG tablet   buPROPion  (WELLBUTRIN  XL) 150 MG 24 hr tablet   Mild episode of recurrent major depressive disorder [facility])   Mother recently passed, patient has had increased stress.  Will continue with current treatment.       Relevant Medications   citalopram  (CELEXA ) 20 MG tablet   buPROPion  (WELLBUTRIN   XL) 150 MG  24 hr tablet   Mixed hyperlipidemia   Labs ordered- lipid panel. Continue working on lifestyle modifications       Relevant Orders   CBC with Differential/Platelet   Lipid panel   Other Visit Diagnoses       Dysuria       Urine dip performed- NEGATIVE   Relevant Orders   POCT urinalysis dipstick (Completed)     Vaginal itching       Urine dip performed in office- NEGATIVE. Vaginal swab performed and sent for testing   Relevant Orders   Cervicovaginal ancillary only               Follow up plan: Return in about 3 months (around 10/24/2023).   I have reviewed this encounter including the documentation in this note and/or discussed this patient with the provider, Aislinn Womack, SNP, I am certifying that I agree with the content of this note as supervising/preceptor nurse practitioner.  Mliss Spray, FNP-C [facility] 07/24/2023, 12:29 PM

## 2023-07-24 NOTE — Assessment & Plan Note (Signed)
 Well controlled. Takes famotidine

## 2023-07-24 NOTE — Assessment & Plan Note (Signed)
 Continue with wellbutrin  and celexa . Refills sent in.

## 2023-07-24 NOTE — Assessment & Plan Note (Signed)
 Ozempic  dose increased from 1mg  to 2mg .

## 2023-07-25 LAB — COMPREHENSIVE METABOLIC PANEL WITH GFR
AG Ratio: 1.6 (calc) (ref 1.0–2.5)
ALT: 18 U/L (ref 6–29)
AST: 14 U/L (ref 10–30)
Albumin: 4.2 g/dL (ref 3.6–5.1)
Alkaline phosphatase (APISO): 81 U/L (ref 31–125)
BUN: 12 mg/dL (ref 7–25)
CO2: 28 mmol/L (ref 20–32)
Calcium: 9.5 mg/dL (ref 8.6–10.2)
Chloride: 103 mmol/L (ref 98–110)
Creat: 0.79 mg/dL (ref 0.50–0.99)
Globulin: 2.7 g/dL (ref 1.9–3.7)
Glucose, Bld: 100 mg/dL — ABNORMAL HIGH (ref 65–99)
Potassium: 4.7 mmol/L (ref 3.5–5.3)
Sodium: 138 mmol/L (ref 135–146)
Total Bilirubin: 0.4 mg/dL (ref 0.2–1.2)
Total Protein: 6.9 g/dL (ref 6.1–8.1)
eGFR: 97 mL/min/1.73m2 (ref >=60)

## 2023-07-25 LAB — CBC WITH DIFFERENTIAL/PLATELET
Absolute Lymphocytes: 3413 {cells}/uL (ref 850–3900)
Absolute Monocytes: 768 {cells}/uL (ref 200–950)
Basophils Absolute: 57 {cells}/uL (ref 0–200)
Basophils Relative: 0.5 %
Eosinophils Absolute: 509 {cells}/uL — ABNORMAL HIGH (ref 15–500)
Eosinophils Relative: 4.5 %
HCT: 37.5 % (ref 35.0–45.0)
Hemoglobin: 12 g/dL (ref 11.7–15.5)
MCH: 26.9 pg — ABNORMAL LOW (ref 27.0–33.0)
MCHC: 32 g/dL (ref 32.0–36.0)
MCV: 84.1 fL (ref 80.0–100.0)
MPV: 10.2 fL (ref 7.5–12.5)
Monocytes Relative: 6.8 %
Neutro Abs: 6554 {cells}/uL (ref 1500–7800)
Neutrophils Relative %: 58 %
Platelets: 383 Thousand/uL (ref 140–400)
RBC: 4.46 Million/uL (ref 3.80–5.10)
RDW: 14.2 % (ref 11.0–15.0)
Total Lymphocyte: 30.2 %
WBC: 11.3 Thousand/uL — ABNORMAL HIGH (ref 3.8–10.8)

## 2023-07-25 LAB — HEMOGLOBIN A1C
Hgb A1c MFr Bld: 6.5 % — ABNORMAL HIGH (ref <5.7)
Mean Plasma Glucose: 140 mg/dL
eAG (mmol/L): 7.7 mmol/L

## 2023-07-25 LAB — LIPID PANEL
Cholesterol: 190 mg/dL (ref <200)
HDL: 34 mg/dL — ABNORMAL LOW (ref >=50)
LDL Cholesterol (Calc): 124 mg/dL — ABNORMAL HIGH
Non-HDL Cholesterol (Calc): 156 mg/dL — ABNORMAL HIGH (ref <130)
Total CHOL/HDL Ratio: 5.6 (calc) — ABNORMAL HIGH (ref <5.0)
Triglycerides: 204 mg/dL — ABNORMAL HIGH (ref <150)

## 2023-07-25 LAB — MICROALBUMIN / CREATININE URINE RATIO
Creatinine, Urine: 105 mg/dL (ref 20–275)
Microalb Creat Ratio: 4 mg/g{creat} (ref <30)
Microalb, Ur: 0.4 mg/dL

## 2023-07-28 ENCOUNTER — Ambulatory Visit: Payer: Self-pay | Admitting: Nurse Practitioner

## 2023-07-28 DIAGNOSIS — B379 Candidiasis, unspecified: Secondary | ICD-10-CM

## 2023-07-28 LAB — CERVICOVAGINAL ANCILLARY ONLY
Bacterial Vaginitis (gardnerella): NEGATIVE
Candida Glabrata: NEGATIVE
Candida Vaginitis: POSITIVE — AB
Chlamydia: NEGATIVE
Comment: NEGATIVE
Comment: NEGATIVE
Comment: NEGATIVE
Comment: NEGATIVE
Comment: NEGATIVE
Comment: NORMAL
Neisseria Gonorrhea: NEGATIVE
Trichomonas: NEGATIVE

## 2023-07-28 MED ORDER — FLUCONAZOLE 150 MG PO TABS: 150.0000 mg | ORAL_TABLET | ORAL | 0 refills | Status: DC | PRN

## 2023-07-29 ENCOUNTER — Encounter: Admitting: Podiatry

## 2023-08-12 ENCOUNTER — Encounter: Payer: Self-pay | Admitting: Podiatry

## 2023-08-12 ENCOUNTER — Ambulatory Visit (INDEPENDENT_AMBULATORY_CARE_PROVIDER_SITE_OTHER)

## 2023-08-12 ENCOUNTER — Ambulatory Visit (INDEPENDENT_AMBULATORY_CARE_PROVIDER_SITE_OTHER): Admitting: Podiatry

## 2023-08-12 VITALS — Ht 62.0 in | Wt 259.7 lb

## 2023-08-12 DIAGNOSIS — M7732 Calcaneal spur, left foot: Secondary | ICD-10-CM
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
## 2023-08-16 ENCOUNTER — Emergency Department

## 2023-08-16 ENCOUNTER — Emergency Department
Admission: EM | Admit: 2023-08-16 | Discharge: 2023-08-16 | Disposition: A | Discharge status: Home / Self Care | Attending: Emergency Medicine | Admitting: Emergency Medicine

## 2023-08-16 ENCOUNTER — Other Ambulatory Visit: Payer: Self-pay

## 2023-08-16 ENCOUNTER — Encounter: Payer: Self-pay | Admitting: Emergency Medicine

## 2023-08-16 DIAGNOSIS — M65972 Unspecified synovitis and tenosynovitis, left ankle and foot: Secondary | ICD-10-CM | POA: Diagnosis not present

## 2023-08-16 DIAGNOSIS — S86002A Unspecified injury of left Achilles tendon, initial encounter: Secondary | ICD-10-CM | POA: Diagnosis not present

## 2023-08-16 DIAGNOSIS — E119 Type 2 diabetes mellitus without complications: Secondary | ICD-10-CM | POA: Diagnosis not present

## 2023-08-16 DIAGNOSIS — W19XXXA Unspecified fall, initial encounter: Secondary | ICD-10-CM | POA: Insufficient documentation

## 2023-08-16 DIAGNOSIS — M79672 Pain in left foot: Secondary | ICD-10-CM | POA: Insufficient documentation

## 2023-08-16 DIAGNOSIS — S86012A Strain of left Achilles tendon, initial encounter: Secondary | ICD-10-CM | POA: Diagnosis not present

## 2023-08-16 DIAGNOSIS — R609 Edema, unspecified: Secondary | ICD-10-CM | POA: Diagnosis not present

## 2023-08-16 DIAGNOSIS — M7732 Calcaneal spur, left foot: Secondary | ICD-10-CM | POA: Diagnosis not present

## 2023-08-16 DIAGNOSIS — M25572 Pain in left ankle and joints of left foot: Secondary | ICD-10-CM | POA: Diagnosis not present

## 2023-08-16 NOTE — ED Triage Notes (Signed)
 To ER with report of left foot pain after mistepping on the steps tonight and hearing a pop. This is the same foot she had surgery on 07/17/23 for bone spurs and plantar fascitis. Took 800mg  of Ibuprofen  and 5-325mg  of oxycodone /tylenol  prior to arrival. +CSM.

## 2023-08-16 NOTE — ED Provider Notes (Signed)
 [facility] Provider Note    Event Date/Time   First MD Initiated Contact with Patient 08/16/23 2104     (approximate)   History   Foot Pain   HPI  Patricia Mcbride is a 43 y.o. female with PMH of obesity, type 2 diabetes, plantar fasciitis who presents for evaluation of left heel pain.  Patient had foot and ankle surgery on 6/26 for plantar fasciotomy, calcaneal ostectomy and Achilles tendon repair.  Patient states that she had her cast removed earlier this week      Physical Exam   Triage Vital Signs: ED Triage Vitals  Encounter Vitals Group     BP 08/16/23 2012 (!) 161/101     Girls Systolic BP Percentile --      Girls Diastolic BP Percentile --      Boys Systolic BP Percentile --      Boys Diastolic BP Percentile --      Pulse Rate 08/16/23 2012 97     Resp 08/16/23 2012 18     Temp 08/16/23 2012 99.2 F (37.3 C)     Temp Source 08/16/23 2012 Oral     SpO2 08/16/23 2012 99 %     Weight 08/16/23 2009 250 lb (113.4 kg)     Height 08/16/23 2009 5' 2 (1.575 m)     Head Circumference --      Peak Flow --      Pain Score 08/16/23 2008 10     Pain Loc --      Pain Education --      Exclude from Growth Chart --     Most recent vital signs: Vitals:   08/16/23 2012  BP: (!) 161/101  Pulse: 97  Resp: 18  Temp: 99.2 F (37.3 C)  SpO2: 99%   General: Awake, tearful. CV:  Good peripheral perfusion.  Resp:  Normal effort.  Abd:  No distention.  Other:  Swelling to the left foot and ankle noted when compared with the right side, no tenderness to palpation in the left foot, very tender to palpation over the heel and Achilles tendon, unable to perform any plantarflexion or dorsiflexion, positive Thompson test.   ED Results / Procedures / Treatments   Labs (all labs ordered are listed, but only abnormal results are displayed) Labs Reviewed - No data to display   RADIOLOGY  Left ankle and foot x-ray obtained, interpreted the  images as well as reviewed the radiologist report, images showed soft tissue swelling in the ankle and foot but no fractures.   Will obtain MRI of the left ankle to further evaluate Achilles tendon rupture.  PROCEDURES:  Critical Care performed: No  Procedures   MEDICATIONS ORDERED IN ED: Medications - No data to display   IMPRESSION / MDM / ASSESSMENT AND PLAN / ED COURSE  I reviewed the triage vital signs and the nursing notes.                             43 year old female presents for evaluation of left foot pain after a fall.  Blood pressure is elevated, vital signs stable otherwise.  Patient very uncomfortable and upset on exam.  Differential diagnosis includes, but is not limited to, Achilles tendon rupture, fracture, muscle strain, dislocation, ligament sprain.  Patient's presentation is most consistent with acute complicated illness / injury requiring diagnostic workup.  X-rays of the left foot and ankle both negative for fractures  but does show some soft tissue swelling.   Patient is very tender to palpation over the Achilles tendon and is concerned that she has retorn her Achilles tendon.  She did have a positive Thompson test.  I spoke with the [place] podiatrist, Dr. Malvin, who recommended MRI.  Patient does not need to wait for results here.  He also recommended she wear a boot and remain nonweightbearing until seen in the office.  Patient already has a boot that was given to her by her podiatrist, Dr. Janit.  Instructed her to put that on when she gets home and remain nonweightbearing.  Advised her to schedule a follow-up appointment with Dr. Janit next week.  She has pain medication previously prescribed to her from surgery that she can use for pain control.  Encouraged her to ice and elevate her foot is much as possible.  Patient aware that we will not be getting results from the MRI today and that she will be discharged after getting the images.  She voiced  understanding, all questions were answered she was stable at discharge.     FINAL CLINICAL IMPRESSION(S) / ED DIAGNOSES   Final diagnoses:  Foot pain, left     Rx / DC Orders   ED Discharge Orders     None        Note:  This document was prepared using Dragon voice recognition software and may include unintentional dictation errors.   Cleaster Tinnie LABOR, PA-C 08/16/23 2243    Levander Slate, MD 08/16/23 [phone]

## 2023-08-16 NOTE — Discharge Instructions (Signed)
 The x-ray of your left foot and ankle did not show any but did show some soft tissue swelling.  The results of the MRI are pending.  Your podiatrist, Dr. Janit should have access to these images.  Please schedule a follow-up appointment with Dr. Janit for sometime next week.  In the meantime, I recommend you wear the boot and remain nonweightbearing.  You can take 650 mg of Tylenol  and 600 mg of ibuprofen  every 6 hours as needed for pain. You can use ice, heat, muscle creams and other topical pain relievers as well.  You can take the previously prescribed Percocet as needed for severe pain.  Return to the emergency department with any worsening symptoms.

## 2023-08-18 ENCOUNTER — Telehealth: Payer: Self-pay | Admitting: Podiatry

## 2023-08-18 NOTE — Telephone Encounter (Signed)
 Spoke to patient asked to schedule appointment this week in office.

## 2023-08-18 NOTE — Telephone Encounter (Signed)
 Patient has fallen and tore her achilles tendon.  She needs a [place] back asap today.

## 2023-08-22 ENCOUNTER — Ambulatory Visit (INDEPENDENT_AMBULATORY_CARE_PROVIDER_SITE_OTHER): Admitting: Podiatry

## 2023-08-22 VITALS — Ht 62.0 in | Wt 250.0 lb

## 2023-08-22 DIAGNOSIS — S86012A Strain of left Achilles tendon, initial encounter: Secondary | ICD-10-CM
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
## 2023-08-27 ENCOUNTER — Telehealth: Payer: Self-pay | Admitting: Podiatry

## 2023-08-27 NOTE — Telephone Encounter (Signed)
 Pt left message stating she was to have surgery scheduled this Friday and has not heard anything.  I checked and got her added on and notified pt she is scheduled for 8/8 at 230pm at [facility].  She is not on blood thinners and has not taken her GLP1 medication for the week as she knew she should not.  Pharmacy is correct in chart.

## 2023-08-28 ENCOUNTER — Other Ambulatory Visit: Payer: Self-pay

## 2023-08-28 ENCOUNTER — Encounter
Admission: RE | Admit: 2023-08-28 | Discharge: 2023-08-28 | Disposition: A | Discharge status: Home / Self Care | Source: Ambulatory Visit | Attending: Podiatry | Admitting: Podiatry

## 2023-08-28 DIAGNOSIS — Z01812 Encounter for preprocedural laboratory examination: Secondary | ICD-10-CM

## 2023-08-28 DIAGNOSIS — S86019A Strain of unspecified Achilles tendon, initial encounter: Secondary | ICD-10-CM

## 2023-08-28 DIAGNOSIS — E119 Type 2 diabetes mellitus without complications: Secondary | ICD-10-CM

## 2023-08-28 HISTORY — DX: Strain of unspecified achilles tendon, initial encounter: S86.019A

## 2023-08-28 HISTORY — DX: Anxiety disorder, unspecified: F41.9

## 2023-08-28 NOTE — Patient Instructions (Addendum)
 Your procedure is scheduled on: 08/29/23 - Friday Report to the Registration Desk on the 1st floor of the [facility]. To find out your arrival time, please [place] [phone] between 1PM - 3PM on: 08/28/23 - Thursday If your arrival time is 6:00 am, do not arrive before that time as the [facility] entrance doors do not open until 6:00 am.  REMEMBER: Instructions that are not followed completely may result in serious medical risk, up to and including death; or upon the discretion of your surgeon and anesthesiologist your surgery may need to be rescheduled.  Do not eat food after midnight the night before surgery.  No gum chewing or hard candies.  You may however, drink CLEAR liquids up to 2 hours before you are scheduled to arrive for your surgery. Do not drink anything within 2 hours of your scheduled arrival time.  Clear liquids include: - water   One week prior to surgery: Stop Anti-inflammatories (NSAIDS) such as Advil , Aleve, Ibuprofen , Motrin , Naproxen, Naprosyn and Aspirin  based products such as Excedrin, Goody's Powder, BC Powder. You may take Tylenol  if needed for pain up until the day of surgery.  Stop ANY OVER THE COUNTER supplements until after surgery.  Metoformin - stop 2 days before surgery. Resume AFTER surgery.   Semaglutide  (Ozempic ) - stop 7 days before surgery.  Resume on your regular day,    TAKE ONLY THESE MEDICATIONS THE MORNING OF SURGERY WITH A SIP OF WATER:   Citalopram  (Celexa ) buPROPion  (WELLBUTRIN  XL)  omeprazole  (PRILOSEC)   No Alcohol for 24 hours before or after surgery.  No Smoking including e-cigarettes for 24 hours before surgery.  No chewable tobacco products for at least 6 hours before surgery.  No nicotine patches on the day of surgery.  Do not use any recreational drugs for at least a week (preferably 2 weeks) before your surgery.  Please be advised that the combination of cocaine and anesthesia may have negative outcomes, up to and  including death. If you test positive for cocaine, your surgery will be cancelled.  On the morning of surgery brush your teeth with toothpaste and water, you may rinse your mouth with mouthwash if you wish. Do not swallow any toothpaste or mouthwash.  Do not wear jewelry, make-up, hairpins, clips or nail polish.  For welded (permanent) jewelry: bracelets, anklets, waist bands, etc.  Please have this removed prior to surgery.  If it is not removed, there is a chance that hospital personnel will need to cut it off on the day of surgery.  Do not wear lotions, powders, or perfumes.   Do not shave body hair from the neck down 48 hours before surgery.  Contact lenses, hearing aids and dentures may not be worn into surgery.  Do not bring valuables to the hospital. [facility] is not responsible for any missing/lost belongings or valuables.   Notify your doctor if there is any change in your medical condition (cold, fever, infection).  Wear comfortable clothing (specific to your surgery type) to the hospital.  After surgery, you can help prevent lung complications by doing breathing exercises.  Take deep breaths and cough every 1-2 hours. Your doctor may order a device called an Incentive Spirometer to help you take deep breaths.  When coughing or sneezing, hold a pillow firmly against your incision with both hands. This is called "splinting." Doing this helps protect your incision. It also decreases belly discomfort.  If you are being admitted to the hospital overnight, leave your  suitcase in the car. After surgery it may be brought to your room.  In case of increased patient census, it may be necessary for you, the patient, to continue your postoperative care in the Same Day Surgery department.  If you are being discharged the day of surgery, you will not be allowed to drive home. You will need a responsible individual to drive you home and stay with you for 24 hours after surgery.   If  you are taking public transportation, you will need to have a responsible individual with you.  Please [place] the Pre-admissions Testing Dept. at [phone] if you have any questions about these instructions.  Surgery Visitation Policy:  Patients having surgery or a procedure may have two visitors.  Children under the age of 9 must have an adult with them who is not the patient.  Inpatient Visitation:    Visiting hours are 7 a.m. to 8 p.m. Up to four visitors are allowed at one time in a patient room. The visitors may rotate out with other people during the day.  One visitor age 88 or older may stay with the patient overnight and must be in the room by 8 p.m.   Merchandiser, retail to address health-related social needs:  [email or web address]

## 2023-08-29 ENCOUNTER — Encounter: Payer: Self-pay | Admitting: Podiatry

## 2023-08-29 ENCOUNTER — Ambulatory Visit: Admitting: Anesthesiology

## 2023-08-29 ENCOUNTER — Ambulatory Visit

## 2023-08-29 ENCOUNTER — Other Ambulatory Visit: Payer: Self-pay

## 2023-08-29 ENCOUNTER — Ambulatory Visit
Admission: RE | Admit: 2023-08-29 | Discharge: 2023-08-29 | Disposition: A | Discharge status: Home / Self Care | Attending: Podiatry | Admitting: Podiatry

## 2023-08-29 ENCOUNTER — Encounter
Admission: RE | Disposition: A | Discharge status: Home / Self Care | Payer: Self-pay | Source: Home / Self Care | Attending: Podiatry

## 2023-08-29 DIAGNOSIS — W109XXA Fall (on) (from) unspecified stairs and steps, initial encounter: Secondary | ICD-10-CM | POA: Diagnosis not present

## 2023-08-29 DIAGNOSIS — G8918 Other acute postprocedural pain: Secondary | ICD-10-CM | POA: Diagnosis not present

## 2023-08-29 DIAGNOSIS — I10 Essential (primary) hypertension: Secondary | ICD-10-CM | POA: Diagnosis not present

## 2023-08-29 DIAGNOSIS — Z01812 Encounter for preprocedural laboratory examination: Secondary | ICD-10-CM

## 2023-08-29 DIAGNOSIS — Z7984 Long term (current) use of oral hypoglycemic drugs: Secondary | ICD-10-CM | POA: Diagnosis not present

## 2023-08-29 DIAGNOSIS — E119 Type 2 diabetes mellitus without complications: Secondary | ICD-10-CM | POA: Diagnosis not present

## 2023-08-29 DIAGNOSIS — S86012D Strain of left Achilles tendon, subsequent encounter: Secondary | ICD-10-CM | POA: Diagnosis not present

## 2023-08-29 DIAGNOSIS — S86012A Strain of left Achilles tendon, initial encounter: Secondary | ICD-10-CM | POA: Insufficient documentation

## 2023-08-29 DIAGNOSIS — X58XXXA Exposure to other specified factors, initial encounter: Secondary | ICD-10-CM | POA: Insufficient documentation

## 2023-08-29 DIAGNOSIS — Z87891 Personal history of nicotine dependence: Secondary | ICD-10-CM | POA: Insufficient documentation

## 2023-08-29 DIAGNOSIS — K219 Gastro-esophageal reflux disease without esophagitis: Secondary | ICD-10-CM | POA: Insufficient documentation

## 2023-08-29 DIAGNOSIS — Z79899 Other long term (current) drug therapy: Secondary | ICD-10-CM | POA: Insufficient documentation

## 2023-08-29 HISTORY — PX: ACHILLES TENDON SURGERY: SHX542

## 2023-08-29 LAB — GLUCOSE, CAPILLARY
Glucose-Capillary: 99 mg/dL (ref 70–99)
Glucose-Capillary: 151 mg/dL — ABNORMAL HIGH (ref 70–99)

## 2023-08-29 LAB — POCT PREGNANCY, URINE: Preg Test, Ur: NEGATIVE

## 2023-08-29 SURGERY — REPAIR, TENDON, ACHILLES
Anesthesia: General | Laterality: Left

## 2023-08-29 MED ORDER — BUPIVACAINE HCL (PF) 0.5 % IJ SOLN
INTRAMUSCULAR | Status: DC | PRN
  Administered 2023-08-29: 10 mL via PERINEURAL

## 2023-08-29 MED ORDER — BUPIVACAINE LIPOSOME 1.3 % IJ SUSP
INTRAMUSCULAR | Status: AC
  Filled 2023-08-29: qty 10

## 2023-08-29 MED ORDER — ACETAMINOPHEN 10 MG/ML IV SOLN
INTRAVENOUS | Status: AC
  Filled 2023-08-29: qty 100

## 2023-08-29 MED ORDER — HYDROMORPHONE HCL 1 MG/ML IJ SOLN
INTRAMUSCULAR | Status: DC | PRN
  Administered 2023-08-29 (×2): .5 mg via INTRAVENOUS

## 2023-08-29 MED ORDER — CEFAZOLIN SODIUM-DEXTROSE 2-4 GM/100ML-% IV SOLN
2.0000 g | INTRAVENOUS | Status: AC
  Administered 2023-08-29: 2 g via INTRAVENOUS

## 2023-08-29 MED ORDER — 0.9 % SODIUM CHLORIDE (POUR BTL) OPTIME
TOPICAL | Status: DC | PRN
Start: 2023-08-29 — End: 2023-08-29
  Administered 2023-08-29: 500 mL

## 2023-08-29 MED ORDER — SODIUM CHLORIDE 0.9 % IV SOLN: INTRAVENOUS | Status: DC

## 2023-08-29 MED ORDER — DEXAMETHASONE SODIUM PHOSPHATE 10 MG/ML IJ SOLN
INTRAMUSCULAR | Status: DC | PRN
  Administered 2023-08-29: 10 mg via INTRAVENOUS

## 2023-08-29 MED ORDER — FENTANYL CITRATE (PF) 100 MCG/2ML IJ SOLN
25.0000 ug | INTRAMUSCULAR | Status: DC | PRN
  Administered 2023-08-29: 50 ug via INTRAVENOUS
  Administered 2023-08-29 (×4): 25 ug via INTRAVENOUS

## 2023-08-29 MED ORDER — OXYCODONE-ACETAMINOPHEN 5-325 MG PO TABS: 1.0000 | ORAL_TABLET | ORAL | 0 refills | Status: DC | PRN

## 2023-08-29 MED ORDER — LIDOCAINE-EPINEPHRINE (PF) 1 %-1:200000 IJ SOLN
INTRAMUSCULAR | Status: AC
  Filled 2023-08-29: qty 30

## 2023-08-29 MED ORDER — IBUPROFEN 800 MG PO TABS: 800.0000 mg | ORAL_TABLET | Freq: Three times a day (TID) | ORAL | 1 refills | Status: AC

## 2023-08-29 MED ORDER — CHLORHEXIDINE GLUCONATE 0.12 % MT SOLN
OROMUCOSAL | Status: AC
  Filled 2023-08-29: qty 15

## 2023-08-29 MED ORDER — DROPERIDOL 2.5 MG/ML IJ SOLN
INTRAMUSCULAR | Status: AC
Start: 2023-08-29 — End: 2023-08-29
  Filled 2023-08-29: qty 2

## 2023-08-29 MED ORDER — ASPIRIN 325 MG PO TBEC: 325.0000 mg | DELAYED_RELEASE_TABLET | Freq: Every day | ORAL | 1 refills | Status: DC

## 2023-08-29 MED ORDER — BUPIVACAINE HCL (PF) 0.5 % IJ SOLN
INTRAMUSCULAR | Status: AC
  Filled 2023-08-29: qty 10

## 2023-08-29 MED ORDER — LIDOCAINE HCL (PF) 1 % IJ SOLN
INTRAMUSCULAR | Status: DC | PRN
  Administered 2023-08-29: 5 mL via SUBCUTANEOUS

## 2023-08-29 MED ORDER — CEFAZOLIN SODIUM-DEXTROSE 2-4 GM/100ML-% IV SOLN
INTRAVENOUS | Status: AC
  Filled 2023-08-29: qty 100

## 2023-08-29 MED ORDER — FENTANYL CITRATE (PF) 100 MCG/2ML IJ SOLN
INTRAMUSCULAR | Status: AC
  Filled 2023-08-29: qty 2

## 2023-08-29 MED ORDER — DEXMEDETOMIDINE HCL IN NACL 80 MCG/20ML IV SOLN
INTRAVENOUS | Status: DC | PRN
  Administered 2023-08-29: 8 ug via INTRAVENOUS

## 2023-08-29 MED ORDER — MIDAZOLAM HCL 2 MG/2ML IJ SOLN
INTRAMUSCULAR | Status: AC
  Filled 2023-08-29: qty 2

## 2023-08-29 MED ORDER — BUPIVACAINE LIPOSOME 1.3 % IJ SUSP
INTRAMUSCULAR | Status: DC | PRN
Start: 2023-08-29 — End: 2023-08-29
  Administered 2023-08-29: 10 mL via PERINEURAL

## 2023-08-29 MED ORDER — FENTANYL CITRATE (PF) 100 MCG/2ML IJ SOLN
INTRAMUSCULAR | Status: DC | PRN
  Administered 2023-08-29: 100 ug via INTRAVENOUS

## 2023-08-29 MED ORDER — ACETAMINOPHEN 10 MG/ML IV SOLN
INTRAVENOUS | Status: DC | PRN
  Administered 2023-08-29: 1000 mg via INTRAVENOUS

## 2023-08-29 MED ORDER — ROPIVACAINE HCL 5 MG/ML IJ SOLN
INTRAMUSCULAR | Status: AC
  Filled 2023-08-29: qty 30

## 2023-08-29 MED ORDER — FENTANYL CITRATE PF 50 MCG/ML IJ SOSY
50.0000 ug | PREFILLED_SYRINGE | Freq: Once | INTRAMUSCULAR | Status: AC
  Administered 2023-08-29: 50 ug via INTRAVENOUS

## 2023-08-29 MED ORDER — BUPIVACAINE-EPINEPHRINE (PF) 0.5% -1:200000 IJ SOLN
INTRAMUSCULAR | Status: AC
  Filled 2023-08-29: qty 30

## 2023-08-29 MED ORDER — FENTANYL CITRATE PF 50 MCG/ML IJ SOSY
PREFILLED_SYRINGE | INTRAMUSCULAR | Status: AC
  Filled 2023-08-29: qty 1

## 2023-08-29 MED ORDER — LIDOCAINE HCL (PF) 1 % IJ SOLN
INTRAMUSCULAR | Status: AC
Start: 2023-08-29 — End: 2023-08-29
  Filled 2023-08-29: qty 5

## 2023-08-29 MED ORDER — PROPOFOL 10 MG/ML IV BOLUS
INTRAVENOUS | Status: DC | PRN
  Administered 2023-08-29: 200 mg via INTRAVENOUS

## 2023-08-29 MED ORDER — ROCURONIUM BROMIDE 100 MG/10ML IV SOLN
INTRAVENOUS | Status: DC | PRN
  Administered 2023-08-29: 20 mg via INTRAVENOUS
  Administered 2023-08-29: 40 mg via INTRAVENOUS

## 2023-08-29 MED ORDER — OXYCODONE HCL 5 MG PO TABS
ORAL_TABLET | ORAL | Status: AC
Start: 2023-08-29 — End: 2023-08-29
  Filled 2023-08-29: qty 1

## 2023-08-29 MED ORDER — OXYCODONE HCL 5 MG PO TABS
ORAL_TABLET | ORAL | Status: AC
  Filled 2023-08-29: qty 1

## 2023-08-29 MED ORDER — HYDROMORPHONE HCL 1 MG/ML IJ SOLN
INTRAMUSCULAR | Status: AC
  Filled 2023-08-29: qty 1

## 2023-08-29 MED ORDER — MIDAZOLAM HCL 2 MG/2ML IJ SOLN
1.0000 mg | INTRAMUSCULAR | Status: AC | PRN
  Administered 2023-08-29 (×2): 1 mg via INTRAVENOUS

## 2023-08-29 MED ORDER — OXYCODONE HCL 5 MG PO TABS
5.0000 mg | ORAL_TABLET | Freq: Once | ORAL | Status: AC
  Administered 2023-08-29: 5 mg via ORAL

## 2023-08-29 MED ORDER — CHLORHEXIDINE GLUCONATE 0.12 % MT SOLN
15.0000 mL | Freq: Once | OROMUCOSAL | Status: AC
  Administered 2023-08-29: 15 mL via OROMUCOSAL

## 2023-08-29 MED ORDER — ORAL CARE MOUTH RINSE: 15.0000 mL | Freq: Once | OROMUCOSAL | Status: AC

## 2023-08-29 MED ORDER — DROPERIDOL 2.5 MG/ML IJ SOLN: 0.6250 mg | Freq: Once | INTRAMUSCULAR | Status: DC | PRN

## 2023-08-29 MED ORDER — ONDANSETRON HCL 4 MG/2ML IJ SOLN
INTRAMUSCULAR | Status: DC | PRN
  Administered 2023-08-29: 4 mg via INTRAVENOUS

## 2023-08-29 MED ORDER — LIDOCAINE HCL (CARDIAC) PF 100 MG/5ML IV SOSY
PREFILLED_SYRINGE | INTRAVENOUS | Status: DC | PRN
  Administered 2023-08-29: 100 mg via INTRAVENOUS

## 2023-08-29 MED ORDER — SUGAMMADEX SODIUM 200 MG/2ML IV SOLN
INTRAVENOUS | Status: DC | PRN
  Administered 2023-08-29: 300 mg via INTRAVENOUS

## 2023-08-29 SURGICAL SUPPLY — 38 items
ANCH SUT GRAPPLER 2.8 STL (Anchor) IMPLANT
BLADE SW THK.38XMED LNG THN (BLADE) IMPLANT
BNDG ELASTIC 4INX 5YD STR LF (GAUZE/BANDAGES/DRESSINGS) ×1 IMPLANT
BNDG ELASTIC 6INX 5YD STR LF (GAUZE/BANDAGES/DRESSINGS) ×1 IMPLANT
BNDG ESMARCH 4X12 STRL LF (GAUZE/BANDAGES/DRESSINGS) ×1 IMPLANT
BNDG GAUZE DERMACEA FLUFF 4 (GAUZE/BANDAGES/DRESSINGS) ×1 IMPLANT
COVER LIGHT HANDLE STERIS (MISCELLANEOUS) ×2 IMPLANT
CUFF TOURN DUAL QUICK 18 (TOURNIQUET CUFF) IMPLANT
CUFF TRNQT CYL 34X4.125X (TOURNIQUET CUFF) IMPLANT
DRAPE U-SHAPE 47X51 STRL (DRAPES) ×1 IMPLANT
DURAPREP 26ML APPLICATOR (WOUND CARE) ×1 IMPLANT
GAUZE 4X4 16PLY ~~LOC~~+RFID DBL (SPONGE) IMPLANT
GAUZE PAD ABD 8X10 STRL (GAUZE/BANDAGES/DRESSINGS) ×1 IMPLANT
GAUZE SPONGE 4X4 12PLY STRL (GAUZE/BANDAGES/DRESSINGS) ×1 IMPLANT
GAUZE XEROFORM 1X8 LF (GAUZE/BANDAGES/DRESSINGS) ×1 IMPLANT
GLOVE BIO SURGEON STRL SZ8 (GLOVE) ×1 IMPLANT
GLOVE BIOGEL PI IND STRL 8 (GLOVE) ×1 IMPLANT
GOWN STRL REUS W/ TWL XL LVL3 (GOWN DISPOSABLE) ×1 IMPLANT
IMPL TENDON SHEET TENOGLID 2X2 IMPLANT
NDL HYPO 22X1.5 SAFETY MO (MISCELLANEOUS) ×1 IMPLANT
NEEDLE HYPO 22X1.5 SAFETY MO (MISCELLANEOUS) ×1 IMPLANT
NS IRRIG 1000ML POUR BTL (IV SOLUTION) ×1 IMPLANT
PACK BASIN MAJOR ARMC (MISCELLANEOUS) ×1 IMPLANT
PACK EXTREMITY ARMC (MISCELLANEOUS) ×1 IMPLANT
PADDING CAST BLEND 4X4 NS (MISCELLANEOUS) IMPLANT
PENCIL SMOKE EVACUATOR (MISCELLANEOUS) ×1 IMPLANT
SOLUTION PREP PVP 2OZ (MISCELLANEOUS) ×2 IMPLANT
SPIKE FLUID TRANSFER (MISCELLANEOUS) ×1 IMPLANT
SPONGE T-LAP 4X18 ~~LOC~~+RFID (SPONGE) ×1 IMPLANT
STAPLER SKIN PROX 35W (STAPLE) ×1 IMPLANT
STOCKINETTE IMPERV 14X48 (MISCELLANEOUS) ×1 IMPLANT
SUCTION TUBE FRAZIER 10FR DISP (SUCTIONS) ×1 IMPLANT
SUT VIC AB 2-0 SH 27XBRD (SUTURE) IMPLANT
SUT VIC AB 3-0 PS2 18XBRD (SUTURE) ×1 IMPLANT
SUTURE VICRYL 4-0 27 PS-2 BART (SUTURE) ×1 IMPLANT
SYR CONTROL 10ML LL (SYRINGE) ×2 IMPLANT
TAPE CAST 4X4 WHT DELT (MISCELLANEOUS) IMPLANT
TRAP FLUID SMOKE EVACUATOR (MISCELLANEOUS) ×1 IMPLANT

## 2023-08-29 NOTE — Brief Op Note (Signed)
 08/29/2023  5:30 PM  PATIENT:  Patricia Mcbride  43 y.o. female  PRE-OPERATIVE DIAGNOSIS:  ACHILLES TENON RUPTURE LEFT  POST-OPERATIVE DIAGNOSIS:  ACHILLES TENON RUPTURE LEFT  PROCEDURE:  Procedure(s): REPAIR, TENDON, ACHILLES (Left)  SURGEON:  Surgeons and Role:    DEWAINE Janit Thresa CHRISTELLA, DPM - Primary  PHYSICIAN ASSISTANT: none  ASSISTANTS: none   ANESTHESIA:   regional and general  EBL:  20 mL   BLOOD ADMINISTERED:none  DRAINS: none   LOCAL MEDICATIONS USED:  NONE  SPECIMEN:  No Specimen  DISPOSITION OF SPECIMEN:  N/A  COUNTS:  YES  TOURNIQUET:   Total Tourniquet Time Documented: Thigh (Left) - 83 minutes Total: Thigh (Left) - 83 minutes   DICTATION: .[place] Dictation  PLAN OF CARE: Discharge to home after PACU  PATIENT DISPOSITION:  PACU - hemodynamically stable.   Delay start of Pharmacological VTE agent (>24hrs) due to surgical blood loss or risk of bleeding: not applicable  Thresa EMERSON Janit, DPM [facility]  Dr. Thresa EMERSON Janit, DPM    [address] [place], [place] [address]                Office [phone]  Fax [phone]

## 2023-08-29 NOTE — Interval H&P Note (Signed)
 History and Physical Interval Note:  08/29/2023 1:47 PM  Patricia Mcbride  has presented today for surgery, with the diagnosis of ACHILLES TENON RUPTURE LEFT.  The various methods of treatment have been discussed with the patient and family. After consideration of risks, benefits and other options for treatment, the patient has consented to  Procedure(s): REPAIR, TENDON, ACHILLES (Left) as a surgical intervention.  The patient's history has been reviewed, patient examined, no change in status, stable for surgery.  I have reviewed the patient's chart and labs.  Questions were answered to the patient's satisfaction.     Thresa CHRISTELLA Sar
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
## 2023-08-29 NOTE — Anesthesia Procedure Notes (Signed)
 Anesthesia Regional Block: Popliteal block   Pre-Anesthetic Checklist: , timeout performed,  Correct Patient, Correct Site, Correct Laterality,  Correct Procedure, Correct Position, site marked,  Risks and benefits discussed,  Surgical consent,  Pre-op evaluation,  At surgeon's request and post-op pain management  Laterality: Lower and Left  Prep: chloraprep       Needles:  Injection technique: Single-shot  Needle Type: Echogenic Needle     Needle Length: 9cm  Needle Gauge: 21     Additional Needles:   Procedures:,,,, ultrasound used (permanent image in chart),,    Narrative:  Start time: 08/29/2023 3:03 PM End time: 08/29/2023 3:06 PM Injection made incrementally with aspirations every 5 mL.  Performed by: Personally  Anesthesiologist: Dario Barter, MD  Additional Notes: Patient consented for risk and benefits of nerve block including but not limited to nerve damage, failed block, bleeding and infection.  Patient voiced understanding.  Functioning IV was confirmed and monitors were applied.  Timeout done prior to procedure and prior to any sedation being given to the patient.  Patient confirmed procedure site prior to any sedation given to the patient.  A 50mm 22ga Stimuplex needle was used. Sterile prep,hand hygiene and sterile gloves were used.  Minimal sedation used for procedure.  No paresthesia endorsed by patient during the procedure.  Negative aspiration and negative test dose prior to incremental administration of local anesthetic. The patient tolerated the procedure well with no immediate complications.

## 2023-08-29 NOTE — Discharge Instructions (Signed)
 See printed attached discharge instructions

## 2023-08-29 NOTE — Op Note (Signed)
 OPERATIVE REPORT Patient name: Patricia Mcbride MRN: [id number] DOB: 03-07-1980  DOS: 08/29/23  Preop Dx: Acute Achilles tendon rupture left Postop Dx: same  Procedure:  1.  Primary repair of Achilles tendon rupture left  Surgeon: Thresa EMERSON Sar DPM  Anesthesia: General anesthesia in combination with local reoperative popliteal block  Hemostasis: Thigh tourniquet inflated to a pressure of 300 mmHg after esmarch exsanguination   EBL: 20 mL Materials: Paragon 28 all suture anchor x 2.  Tendon graft x 1 Injectables: none Pathology: None  Condition: The patient tolerated the procedure and anesthesia well. No complications noted or reported   Justification for procedure: The patient is a 43 y.o. female who presents today for surgical correction of acute Achilles tendon rupture to the left lower extremity.  The patient was told benefits as well as possible side effects of the surgery. The patient consented for surgical correction. The patient consent form was reviewed. All patient questions were answered. No guarantees were expressed or implied. The patient and the surgeon both signed the patient consent form with the witness present and placed in the patient's chart.   Procedure in Detail: The patient was brought to the operating room, placed in the operating table in the supine position at which time an aseptic scrub and drape were performed about the patient's respective lower extremity after anesthesia was induced as described above. Attention was then directed to the surgical area where procedure number one commenced.  Procedure #1: Primary repair Achilles tendon rupture left An 8 cm linear longitudinal skin incision was planned and made overlying the distal aspect of the Achilles tendon left lower extremity.  Incision carried down to the level of the Achilles tendon and peritenon with care taken to cut clamp ligate retract away all small neurovascular structures traversing the  incision site.  The peritenon was sharply dissected and the underlying Achilles tendon rupture was identified.  Any nonviable portion of the Achilles tendon was excisionally debrided using a #15 scalpel as well as any nonviable tissue within proximity of the Achilles tendon rupture site.  Nonabsorbable suture from previous surgery was also removed.  The posterior tubercle of the calcaneus was excisionally debrided down to healthy bleeding bone using a sagittal plane sagittal saw.  Copious irrigation was then utilized within the incision site to irrigate any hemorrhagic coagulated blood and debris in preparation for primary repair.  Paragon 28 all suture anchors were then driven through the posterior tubercle of the calcaneus and inserted in the standard manner as recommended on the surgical technique guide.  The distal portion of the Achilles tendon was anchored back down to the posterior tubercle of the calcaneus and running locking suture was performed proximal along the medial and lateral portions of the Achilles tendon approximately 5 cm above the rupture site in a modified Krakw type technique.  The medial and lateral suture was then tied creating a stable construct and reinforcement of the Achilles tendon.  Additional irrigation was then utilized in preparation for primary closure.  Prior to primary closure tendon graft was placed along the distal portion of the Achilles tendon to promote postoperative healing and reduce scar tissue.  3-0 Vicryl suture was utilized to reapproximate deep subcutaneous tissue followed by 4-0 Vicryl suture and skin staples to reapproximate superficial skin edges.  Dry sterile compressive dressings were then applied to all previously mentioned incision sites about the patient's lower extremity. The tourniquet which was used for hemostasis was deflated. All normal neurovascular responses including pink  color and warmth returned all the digits of patient's lower extremity.  Prior  to transfer to the recovery room a below-knee fiberglass cast was then applied to the lower extremity.  The patient was then transferred from the operating room to the recovery room having tolerated the procedure and anesthesia well. All vital signs are stable. After a brief stay in the recovery room the patient was readmitted to inpatient room with postoperative orders placed.     Thresa EMERSON Sar, DPM [facility]  Dr. Thresa EMERSON Sar, DPM    [address] [place], [place] [address]                Office [phone]  Fax [phone]

## 2023-08-29 NOTE — Transfer of Care (Signed)
 Immediate Anesthesia Transfer of Care Note  Patient: Patricia Mcbride  Procedure(s) Performed: REPAIR, TENDON, ACHILLES (Left)  Patient Location: PACU  Anesthesia Type:General  Level of Consciousness: awake, alert , and oriented  Airway & Oxygen Therapy: Patient Spontanous Breathing and Patient connected to face mask oxygen  Post-op Assessment: Report given to RN and Post -op Vital signs reviewed and stable  Post vital signs: Reviewed and stable  Last Vitals:  Vitals Value Taken Time  BP 114/45 08/29/23 17:29  Temp    Pulse 94 08/29/23 17:32  Resp 19 08/29/23 17:32  SpO2 100 % 08/29/23 17:32  Vitals shown include unfiled device data.  Last Pain:  Vitals:   08/29/23 1342  TempSrc: Temporal  PainSc: 0-No pain         Complications: No notable events documented.

## 2023-08-29 NOTE — Anesthesia Procedure Notes (Signed)
 Procedure Name: Intubation Date/Time: 08/29/2023 3:23 PM  Performed by: Dominica Krabbe, CRNAPre-anesthesia Checklist: Patient identified, Emergency Drugs available, Suction available, Patient being monitored and Timeout performed Patient Re-evaluated:Patient Re-evaluated prior to induction Oxygen Delivery Method: Circle system utilized Preoxygenation: Pre-oxygenation with 100% oxygen Induction Type: IV induction Ventilation: Mask ventilation without difficulty Laryngoscope Size: McGrath and 3 Grade View: Grade II Tube type: Oral Tube size: 7.0 mm Number of attempts: 1 Airway Equipment and Method: Stylet and Video-laryngoscopy Placement Confirmation: ETT inserted through vocal cords under direct vision, positive ETCO2 and breath sounds checked- equal and bilateral Secured at: 21 cm Tube secured with: Tape Dental Injury: Teeth and Oropharynx as per pre-operative assessment

## 2023-08-31 NOTE — Anesthesia Postprocedure Evaluation (Signed)
 Anesthesia Post Note  Patient: Delon Knee Whitehurst  Procedure(s) Performed: REPAIR, TENDON, ACHILLES (Left)  Patient location during evaluation: PACU Anesthesia Type: General Level of consciousness: awake and alert Pain management: pain level controlled Vital Signs Assessment: post-procedure vital signs reviewed and stable Respiratory status: spontaneous breathing, nonlabored ventilation, respiratory function stable and patient connected to nasal cannula oxygen Cardiovascular status: blood pressure returned to baseline and stable Postop Assessment: no apparent nausea or vomiting Anesthetic complications: no   No notable events documented.   Last Vitals:  Vitals:   08/29/23 1830 08/29/23 1850  BP: (!) 93/50 (!) 151/94  Pulse: 96 88  Resp: 20 16  Temp:    SpO2: 98% 97%    Last Pain:  Vitals:   08/29/23 1850  TempSrc:   PainSc: Asleep                 Prentice Murphy

## 2023-09-01 ENCOUNTER — Encounter: Payer: Self-pay | Admitting: Podiatry

## 2023-09-03 ENCOUNTER — Encounter: Payer: Self-pay | Admitting: Podiatry

## 2023-09-05 ENCOUNTER — Encounter: Payer: Self-pay | Admitting: Podiatry

## 2023-09-05 ENCOUNTER — Ambulatory Visit (INDEPENDENT_AMBULATORY_CARE_PROVIDER_SITE_OTHER): Admitting: Podiatry

## 2023-09-05 ENCOUNTER — Ambulatory Visit (INDEPENDENT_AMBULATORY_CARE_PROVIDER_SITE_OTHER)

## 2023-09-05 VITALS — Ht 62.0 in | Wt 250.0 lb

## 2023-09-05 DIAGNOSIS — S86012A Strain of left Achilles tendon, initial encounter: Secondary | ICD-10-CM
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
## 2023-09-12 ENCOUNTER — Encounter: Admitting: Podiatry

## 2023-09-23 ENCOUNTER — Encounter: Admitting: Podiatry

## 2023-09-26 ENCOUNTER — Encounter: Payer: Self-pay | Admitting: Podiatry

## 2023-09-26 ENCOUNTER — Ambulatory Visit (INDEPENDENT_AMBULATORY_CARE_PROVIDER_SITE_OTHER)

## 2023-09-26 ENCOUNTER — Ambulatory Visit (INDEPENDENT_AMBULATORY_CARE_PROVIDER_SITE_OTHER): Admitting: Podiatry

## 2023-09-26 VITALS — Ht 62.0 in | Wt 250.0 lb

## 2023-09-26 DIAGNOSIS — S86012A Strain of left Achilles tendon, initial encounter: Secondary | ICD-10-CM
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
## 2023-10-24 ENCOUNTER — Ambulatory Visit: Admitting: Nurse Practitioner

## 2023-10-24 ENCOUNTER — Encounter: Payer: Self-pay | Admitting: Nurse Practitioner

## 2023-10-24 ENCOUNTER — Encounter: Admitting: Podiatry

## 2023-10-24 VITALS — BP 124/78 | HR 98 | Temp 98.6°F | Resp 18 | Ht 62.0 in | Wt 246.9 lb

## 2023-10-24 DIAGNOSIS — F419 Anxiety disorder, unspecified: Secondary | ICD-10-CM | POA: Diagnosis not present

## 2023-10-24 DIAGNOSIS — E782 Mixed hyperlipidemia: Secondary | ICD-10-CM | POA: Diagnosis not present

## 2023-10-24 DIAGNOSIS — Z7984 Long term (current) use of oral hypoglycemic drugs: Secondary | ICD-10-CM | POA: Diagnosis not present

## 2023-10-24 DIAGNOSIS — E1165 Type 2 diabetes mellitus with hyperglycemia: Secondary | ICD-10-CM

## 2023-10-24 DIAGNOSIS — Z23 Encounter for immunization: Secondary | ICD-10-CM

## 2023-10-24 DIAGNOSIS — K219 Gastro-esophageal reflux disease without esophagitis: Secondary | ICD-10-CM | POA: Diagnosis not present

## 2023-10-24 DIAGNOSIS — F339 Major depressive disorder, recurrent, unspecified: Secondary | ICD-10-CM | POA: Diagnosis not present

## 2023-10-24 DIAGNOSIS — Z1231 Encounter for screening mammogram for malignant neoplasm of breast: Secondary | ICD-10-CM

## 2023-10-24 LAB — POCT GLYCOSYLATED HEMOGLOBIN (HGB A1C): Hemoglobin A1C: 5.5 % (ref 4.0–5.6)

## 2023-10-24 MED ORDER — METFORMIN HCL 500 MG PO TABS: 500.0000 mg | ORAL_TABLET | Freq: Two times a day (BID) | ORAL | 1 refills | Status: AC

## 2023-10-24 MED ORDER — CITALOPRAM HYDROBROMIDE 20 MG PO TABS: 20.0000 mg | ORAL_TABLET | Freq: Two times a day (BID) | ORAL | Status: DC

## 2023-10-24 MED ORDER — OZEMPIC (2 MG/DOSE) 8 MG/3ML ~~LOC~~ SOPN: 2.0000 mg | PEN_INJECTOR | SUBCUTANEOUS | 3 refills | Status: AC

## 2023-10-24 NOTE — Progress Notes (Signed)
 BP 124/78   Pulse 98   Temp 98.6 F (37 C)   Resp 18   Ht 5' 2 (1.575 m)   Wt 246 lb 14.4 oz (112 kg)   SpO2 99%   BMI 45.16 kg/m    Subjective:    Patient ID: Patricia Mcbride, female    DOB: 13-Apr-1980, 43 y.o.   MRN: [id number]  HPI: Patricia Mcbride is a 43 y.o. female  Chief Complaint  Patient presents with   Medical Management of Chronic Issues   Discussed the use of AI scribe software for clinical note transcription with the patient, who gave verbal consent to proceed.  History of Present Illness Patricia Mcbride is a 43 year old female who presents with post-operative pain following Achilles tendon repair.  Post-operative pain and functional limitation - Status post open Achilles tendon repair on August 29, 2023 for tendon rupture - Severe pain immediately post-operatively due to ineffective nerve block, more intense than initial rupture, lasting approximately five days - Persistent pain continues, limiting weight-bearing on the affected foot and impairing ambulation - Difficulty performing daily activities, including walking and taking her niece to the park - Dissatisfaction with surgical scar and regrets undergoing surgery, expressing preference for orthotic management - Frustration and anxiety related to inability to engage in physical activities and care for her niece  Mental health symptoms - History of depression and anxiety, managed with Celexa  40 mg daily and Wellbutrin  150 mg daily - Current mental health symptoms are exacerbated by physical limitations and inability to participate in desired activities - No concerns regarding efficacy of current psychiatric medications  Glycemic control - Type 2 diabetes managed with Ozempic  2 mg weekly and metformin  500 mg twice daily - Most recent A1c on July 24, 2023 was 6.5, with recent improvement to 5.5  Hyperlipidemia - Most recent lipid panel: LDL 124 mg/dL, triglycerides 795 mg/dL     [employer] Visit from 10/24/2023 in [facility] Office Visit from 07/24/2023 in [facility]  1 45.5 inches 45 inches       10/24/2023    9:37 AM 04/16/2023   10:00 AM 12/26/2022    8:36 AM  Depression screen PHQ 2/9  Decreased Interest 1 1 1   Down, Depressed, Hopeless 1 1 1   PHQ - 2 Score 2 2 2   Altered sleeping 1 0 0  Tired, decreased energy 1 1 1   Change in appetite 1 1 1   Feeling bad or failure about yourself  1 1 1   Trouble concentrating 1 1 1   Moving slowly or fidgety/restless 1 0 1  Suicidal thoughts 1 0 0  PHQ-9 Score 9 6 7   Difficult doing work/chores Somewhat difficult Not difficult at all Somewhat difficult       10/24/2023    9:37 AM 04/16/2023   10:00 AM 12/26/2022    8:37 AM 08/26/2022   11:29 AM  GAD 7 : Generalized Anxiety Score  Nervous, Anxious, on Edge 1 1 1 1   Control/stop worrying 1 1 1 1   Worry too much - different things 1 1 1 1   Trouble relaxing 1 1 1 1   Restless 1 1 1  0  Easily annoyed or irritable 1 1 1 1   Afraid - awful might happen 1 1 1 1   Total GAD 7 Score 7 7 7 6   Anxiety Difficulty Somewhat difficult Somewhat difficult Somewhat difficult Somewhat difficult     Relevant past medical, surgical,  family and social history reviewed and updated as indicated. Interim medical history since our last visit reviewed. Allergies and medications reviewed and updated.  Review of Systems  Constitutional: Negative for fever or weight change.  Respiratory: Negative for cough and shortness of breath.   Cardiovascular: Negative for chest pain or palpitations.  Gastrointestinal: Negative for abdominal pain, no bowel changes.  Musculoskeletal: positive for gait problem or joint swelling.  Skin: Negative for rash.  Neurological: Negative for dizziness or headache.  No other specific complaints in a complete review of systems (except as listed in HPI above).      Objective:      BP 124/78   Pulse 98   Temp  98.6 F (37 C)   Resp 18   Ht 5' 2 (1.575 m)   Wt 246 lb 14.4 oz (112 kg)   SpO2 99%   BMI 45.16 kg/m    Wt Readings from Last 3 Encounters:  10/24/23 246 lb 14.4 oz (112 kg)  09/26/23 250 lb (113.4 kg)  09/05/23 250 lb (113.4 kg)    Physical Exam VITALS: BP- 124/78 GENERAL: Alert, cooperative, well developed, no acute distress HEENT: Normocephalic, normal oropharynx, moist mucous membranes CHEST: Clear to auscultation bilaterally, No wheezes, rhonchi, or crackles CARDIOVASCULAR: Normal heart rate and rhythm, S1 and S2 normal without murmurs ABDOMEN: Soft, non-tender, non-distended, without organomegaly, Normal bowel sounds EXTREMITIES: No cyanosis or edema NEUROLOGICAL: Cranial nerves grossly intact, Moves all extremities without gross motor or sensory deficit  Results for orders placed or performed in visit on 10/24/23  POCT HgB A1C   Collection Time: 10/24/23  9:36 AM  Result Value Ref Range   Hemoglobin A1C 5.5 4.0 - 5.6 %   HbA1c POC (<> result, manual entry)     HbA1c, POC (prediabetic range)     HbA1c, POC (controlled diabetic range)            Assessment & Plan:   Problem List Items Addressed This Visit       Digestive   Gastroesophageal reflux disease without esophagitis     Endocrine   Type 2 diabetes mellitus with hyperglycemia, without long-term current use of insulin  (HCC) - Primary   Relevant Medications   Semaglutide , 2 MG/DOSE, (OZEMPIC , 2 MG/DOSE,) 8 MG/3ML SOPN   metFORMIN  (GLUCOPHAGE ) 500 MG tablet   Other Relevant Orders   POCT HgB A1C (Completed)   HM Diabetes Foot Exam (Completed)     Other   Major depressive disorder, recurrent episode with anxious distress   Relevant Medications   citalopram  (CELEXA ) 20 MG tablet   Morbid obesity (HCC)   Relevant Medications   Semaglutide , 2 MG/DOSE, (OZEMPIC , 2 MG/DOSE,) 8 MG/3ML SOPN   metFORMIN  (GLUCOPHAGE ) 500 MG tablet   Anxiety   Relevant Medications   citalopram  (CELEXA ) 20 MG tablet    Mixed hyperlipidemia   Other Visit Diagnoses       Immunization due       Relevant Orders   Flu vaccine trivalent PF, 6mos and older(Flulaval,Afluria,Fluarix,Fluzone) (Completed)   Pneumococcal conjugate vaccine 20-valent (Prevnar 20) (Completed)     Encounter for screening mammogram for malignant neoplasm of breast       Relevant Orders   MM 3D SCREENING MAMMOGRAM BILATERAL BREAST        Assessment and Plan Assessment & Plan Achilles tendon rupture, post-surgical, ongoing pain Post-surgical pain following Achilles tendon repair on August 29, 2023, with severe pain due to an ineffective nerve block and ongoing pain limiting mobility.  Unable to bear weight comfortably and awaiting physical therapy orders from podiatrist. - Start physical therapy after receiving orders from podiatrist next week.  Depression and anxiety disorder Depression and anxiety exacerbated by ongoing pain and limitations from Achilles tendon rupture. Mental health struggles are more related to physical limitations rather than medication efficacy. - Adjust medication list to reflect Celexa  20 mg twice daily. - Continue Wellbutrin  150 mg daily.  Type 2 diabetes mellitus Type 2 diabetes mellitus is well-controlled with an A1c of 5.5%. Tolerating current medication regimen well. - Continue Ozempic  2 mg weekly. - Continue metformin  500 mg twice daily. - Refill metformin  prescription.  Hyperlipidemia Hyperlipidemia with LDL of 124 and triglycerides of 204.  Obesity Filed Weights   10/24/23 0921  Weight: 246 lb 14.4 oz (112 kg)   Body mass index is 45.16 kg/m.  Flowsheet Row Office Visit from 10/24/2023 in [facility] Office Visit from 07/24/2023 in [facility]  1 45.5 inches 45 inches   - Encourage continuation of lifestyle modifications, including dietary management and regular exercise. -continue to increase physical activity, getting at least 150 min of  physical activity a week.  Work on including [employer], broadcasting/film/video 2 days a week.  - continue eating at a calorie deficit 1600-1700 cal a day, eating a well balanced diet with whole foods, avoiding processed foods.   Patient is motivated to continue working on lifestyle modification.    General Health Maintenance Planning to schedule a mammogram and an eye exam. - Schedule eye exam.        Follow up plan: Return in about 4 months (around 02/24/2024) for follow up.

## 2023-10-31 ENCOUNTER — Ambulatory Visit: Admitting: Podiatry

## 2023-10-31 ENCOUNTER — Encounter: Payer: Self-pay | Admitting: Podiatry

## 2023-10-31 VITALS — Ht 62.0 in | Wt 246.9 lb

## 2023-10-31 DIAGNOSIS — S86012A Strain of left Achilles tendon, initial encounter: Secondary | ICD-10-CM
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
## 2023-12-06 ENCOUNTER — Other Ambulatory Visit: Payer: Self-pay | Admitting: Nurse Practitioner

## 2023-12-06 DIAGNOSIS — F419 Anxiety disorder, unspecified: Secondary | ICD-10-CM

## 2023-12-06 DIAGNOSIS — F330 Major depressive disorder, recurrent, mild: Secondary | ICD-10-CM

## 2023-12-06 DIAGNOSIS — F339 Major depressive disorder, recurrent, unspecified: Secondary | ICD-10-CM

## 2023-12-09 NOTE — Telephone Encounter (Signed)
 Requested medications are due for refill today.  unsure  Requested medications are on the active medications list.  yes  Last refill. 10/24/2023  - unknown quantity - rx was printed  Future visit scheduled.   Next year  Notes to clinic.  Please review for refill    Requested Prescriptions  Pending Prescriptions Disp Refills   citalopram  (CELEXA ) 20 MG tablet [Pharmacy Med Name: CITALOPRAM  20MG  TABLETS] 90 tablet     Sig: TAKE 1 TABLET(20 MG) BY MOUTH DAILY     Psychiatry:  Antidepressants - SSRI Passed - 12/09/2023 11:45 AM      Passed - Completed PHQ-2 or PHQ-9 in the last 360 days      Passed - Valid encounter within last 6 months    Recent Outpatient Visits           1 month ago Type 2 diabetes mellitus with hyperglycemia, without long-term current use of insulin  [facility])   [place] [facility] Gareth Mliss FALCON, FNP   4 months ago Type 2 diabetes mellitus with hyperglycemia, without long-term current use of insulin  [facility])   [place] [facility] Gareth Mliss FALCON, FNP   7 months ago Pain of left heel   [facility] Gareth Mliss F[place], [place]              Refused Prescriptions Disp Refills   buPROPion  (WELLBUTRIN  XL) 150 MG 24 hr tablet [Pharmacy Med Name: BUPROPION  XL 150MG  TABLETS (24 H)] 90 tablet 1    Sig: TAKE 1 TABLET(150 MG) BY MOUTH DAILY     Psychiatry: Antidepressants - bupropion  Passed - 12/09/2023 11:45 AM      Passed - Cr in normal range and within 360 days    Creat  Date Value Ref Range Status  07/24/2023 0.79 0.50 - 0.99 mg/dL Final   Creatinine, Urine  Date Value Ref Range Status  07/24/2023 105 20 - 275 mg/dL Final         Passed - AST in normal range and within 360 days    AST  Date Value Ref Range Status  07/24/2023 14 10 - 30 U/L Final         Passed - ALT in normal range and within 360 days    ALT  Date Value Ref Range Status  07/24/2023 18 6 - 29 U/L Final         Passed -  Completed PHQ-2 or PHQ-9 in the last 360 days      Passed - Last BP in normal range    BP Readings from Last 1 Encounters:  10/24/23 124/78         Passed - Valid encounter within last 6 months    Recent Outpatient Visits           1 month ago Type 2 diabetes mellitus with hyperglycemia, without long-term current use of insulin  [facility])   [facility] Gareth Mliss F, FNP   4 months ago Type 2 diabetes mellitus with hyperglycemia, without long-term current use of insulin  [facility])   [facility] Gareth Mliss FALCON, FNP   7 months ago Pain of left heel   [facility] Gareth Mliss FAL[place], [place]

## 2023-12-09 NOTE — Telephone Encounter (Signed)
 Requested Prescriptions  Pending Prescriptions Disp Refills   citalopram  (CELEXA ) 20 MG tablet [Pharmacy Med Name: CITALOPRAM  20MG  TABLETS] 90 tablet     Sig: TAKE 1 TABLET(20 MG) BY MOUTH DAILY     Psychiatry:  Antidepressants - SSRI Passed - 12/09/2023 11:44 AM      Passed - Completed PHQ-2 or PHQ-9 in the last 360 days      Passed - Valid encounter within last 6 months    Recent Outpatient Visits           1 month ago Type 2 diabetes mellitus with hyperglycemia, without long-term current use of insulin  [facility])   [place] [facility] Gareth Mliss FALCON, FNP   4 months ago Type 2 diabetes mellitus with hyperglycemia, without long-term current use of insulin  [facility])   [place] [facility] Gareth Mliss FALCON, FNP   7 months ago Pain of left heel   [facility] Gareth Mliss [place], [place]              Refused Prescriptions Disp Refills   buPROPion  (WELLBUTRIN  XL) 150 MG 24 hr tablet [Pharmacy Med Name: BUPROPION  XL 150MG  TABLETS (24 H)] 90 tablet 1    Sig: TAKE 1 TABLET(150 MG) BY MOUTH DAILY     Psychiatry: Antidepressants - bupropion  Passed - 12/09/2023 11:44 AM      Passed - Cr in normal range and within 360 days    Creat  Date Value Ref Range Status  07/24/2023 0.79 0.50 - 0.99 mg/dL Final   Creatinine, Urine  Date Value Ref Range Status  07/24/2023 105 20 - 275 mg/dL Final         Passed - AST in normal range and within 360 days    AST  Date Value Ref Range Status  07/24/2023 14 10 - 30 U/L Final         Passed - ALT in normal range and within 360 days    ALT  Date Value Ref Range Status  07/24/2023 18 6 - 29 U/L Final         Passed - Completed PHQ-2 or PHQ-9 in the last 360 days      Passed - Last BP in normal range    BP Readings from Last 1 Encounters:  10/24/23 124/78         Passed - Valid encounter within last 6 months    Recent Outpatient Visits           1 month ago Type 2 diabetes mellitus with  hyperglycemia, without long-term current use of insulin  [facility])   [facility] Gareth Mliss F, FNP   4 months ago Type 2 diabetes mellitus with hyperglycemia, without long-term current use of insulin  [facility])   [facility] Gareth Mliss FALCON, FNP   7 months ago Pain of left heel   [facility] Gareth Mliss F[place], [place]

## 2023-12-12 ENCOUNTER — Emergency Department: Admission: EM | Admit: 2023-12-12 | Discharge: 2023-12-12 | Disposition: A | Discharge status: Home / Self Care

## 2023-12-12 ENCOUNTER — Other Ambulatory Visit: Payer: Self-pay

## 2023-12-12 DIAGNOSIS — D72829 Elevated white blood cell count, unspecified: Secondary | ICD-10-CM | POA: Diagnosis not present

## 2023-12-12 DIAGNOSIS — R112 Nausea with vomiting, unspecified: Secondary | ICD-10-CM | POA: Insufficient documentation

## 2023-12-12 DIAGNOSIS — R197 Diarrhea, unspecified: Secondary | ICD-10-CM | POA: Diagnosis not present

## 2023-12-12 DIAGNOSIS — E119 Type 2 diabetes mellitus without complications: Secondary | ICD-10-CM | POA: Diagnosis not present

## 2023-12-12 LAB — COMPREHENSIVE METABOLIC PANEL WITH GFR
ALT: 17 U/L (ref 0–44)
AST: 17 U/L (ref 15–41)
Albumin: 4.9 g/dL (ref 3.5–5.0)
Alkaline Phosphatase: 133 U/L — ABNORMAL HIGH (ref 38–126)
Anion gap: 13 (ref 5–15)
BUN: 13 mg/dL (ref 6–20)
CO2: 24 mmol/L (ref 22–32)
Calcium: 9.6 mg/dL (ref 8.9–10.3)
Chloride: 104 mmol/L (ref 98–111)
Creatinine, Ser: 0.95 mg/dL (ref 0.44–1.00)
GFR, Estimated: >60 mL/min (ref >60)
Glucose, Bld: 149 mg/dL — ABNORMAL HIGH (ref 70–99)
Potassium: 3.5 mmol/L (ref 3.5–5.1)
Sodium: 140 mmol/L (ref 135–145)
Total Bilirubin: 0.5 mg/dL (ref 0.0–1.2)
Total Protein: 8.3 g/dL — ABNORMAL HIGH (ref 6.5–8.1)

## 2023-12-12 LAB — CBC
HCT: 43.6 % (ref 36.0–46.0)
Hemoglobin: 14.3 g/dL (ref 12.0–15.0)
MCH: 27.2 pg (ref 26.0–34.0)
MCHC: 32.8 g/dL (ref 30.0–36.0)
MCV: 83 fL (ref 80.0–100.0)
Platelets: 482 K/uL — ABNORMAL HIGH (ref 150–400)
RBC: 5.25 MIL/uL — ABNORMAL HIGH (ref 3.87–5.11)
RDW: 13.3 % (ref 11.5–15.5)
WBC: 13.7 K/uL — ABNORMAL HIGH (ref 4.0–10.5)
nRBC: 0 % (ref 0.0–0.2)

## 2023-12-12 LAB — URINALYSIS, ROUTINE W REFLEX MICROSCOPIC
Bilirubin Urine: NEGATIVE
Glucose, UA: NEGATIVE mg/dL
Hgb urine dipstick: NEGATIVE
Ketones, ur: NEGATIVE mg/dL
Leukocytes,Ua: NEGATIVE
Nitrite: NEGATIVE
Protein, ur: NEGATIVE mg/dL
Specific Gravity, Urine: 1.027 (ref 1.005–1.030)
pH: 5 (ref 5.0–8.0)

## 2023-12-12 LAB — POC URINE PREG, ED: Preg Test, Ur: NEGATIVE

## 2023-12-12 LAB — LIPASE, BLOOD: Lipase: 32 U/L (ref 11–51)

## 2023-12-12 MED ORDER — PANTOPRAZOLE SODIUM 40 MG IV SOLR
40.0000 mg | Freq: Once | INTRAVENOUS | Status: AC
  Administered 2023-12-12: 40 mg via INTRAVENOUS
  Filled 2023-12-12: qty 10

## 2023-12-12 MED ORDER — ONDANSETRON 4 MG PO TBDP: 4.0000 mg | ORAL_TABLET | Freq: Three times a day (TID) | ORAL | 0 refills | Status: AC | PRN

## 2023-12-12 MED ORDER — PANTOPRAZOLE SODIUM 40 MG PO TBEC: 40.0000 mg | DELAYED_RELEASE_TABLET | ORAL | 0 refills | Status: AC

## 2023-12-12 MED ORDER — DICYCLOMINE HCL 10 MG PO CAPS
10.0000 mg | ORAL_CAPSULE | Freq: Once | ORAL | Status: AC
  Administered 2023-12-12: 10 mg via ORAL
  Filled 2023-12-12: qty 1

## 2023-12-12 MED ORDER — ONDANSETRON HCL 4 MG/2ML IJ SOLN
4.0000 mg | Freq: Once | INTRAMUSCULAR | Status: AC
  Administered 2023-12-12: 4 mg via INTRAVENOUS
  Filled 2023-12-12: qty 2

## 2023-12-12 MED ORDER — SODIUM CHLORIDE 0.9 % IV BOLUS
1000.0000 mL | Freq: Once | INTRAVENOUS | Status: AC
  Administered 2023-12-12: 1000 mL via INTRAVENOUS

## 2023-12-12 NOTE — ED Triage Notes (Signed)
 Pt comes in via pov with complaints of vomiting and diarrhea that started Sunday evening. Pt has continued to have episodes despite a mild diet. Pt's last episode of v/d was about an hour ago. Pt has no complaints of pain at this time. Pt is currently on Ozempic  and metformin . Pt took her Ozempic  shot on Sunday morning. Pt has been on Ozempic  for about 2 years. Pt has concerns of dehydration.

## 2023-12-12 NOTE — ED Provider Notes (Signed)
 [facility] Emergency Department Provider Note     Event Date/Time   First MD Initiated Contact with Patient 12/12/23 1619     (approximate)   History   Emesis and Diarrhea   HPI  Patricia Mcbride is a 43 y.o. female with a past medical history of GERD, type 2 diabetes, and anxiety presents to the ED for evaluation of vomiting and diarrhea since x 4 to 5 days.  Denies fever, abdominal pain, urinary symptoms or bloody stools.  She has tried nothing for her symptoms.  Unknown sick contacts.  No other complaint.     Physical Exam   Triage Vital Signs: ED Triage Vitals  Encounter Vitals Group     BP 12/12/23 1557 131/87     Girls Systolic BP Percentile --      Girls Diastolic BP Percentile --      Boys Systolic BP Percentile --      Boys Diastolic BP Percentile --      Pulse Rate 12/12/23 1557 100     Resp 12/12/23 1557 17     Temp 12/12/23 1557 98.6 F (37 C)     Temp src --      SpO2 12/12/23 1557 100 %     Weight 12/12/23 1558 246 lb 14.6 oz (112 kg)     Height 12/12/23 1558 5' 2 (1.575 m)     Head Circumference --      Peak Flow --      Pain Score 12/12/23 1557 0     Pain Loc --      Pain Education --      Exclude from Growth Chart --     Most recent vital signs: Vitals:   12/12/23 1557 12/12/23 2005  BP: 131/87 134/89  Pulse: 100 91  Resp: 17 18  Temp: 98.6 F (37 C) 98.6 F (37 C)  SpO2: 100% 100%   General Awake, no distress.  HEENT NCAT.  CV:  Good peripheral perfusion.  RESP:  Normal effort.  ABD:  No distention.  Soft, nontender in all quadrants.  ED Results / Procedures / Treatments   Labs (all labs ordered are listed, but only abnormal results are displayed) Labs Reviewed  COMPREHENSIVE METABOLIC PANEL WITH GFR - Abnormal; Notable for the following components:      Result Value   Glucose, Bld 149 (*)    Total Protein 8.3 (*)    Alkaline Phosphatase 133 (*)    All other components within normal limits   CBC - Abnormal; Notable for the following components:   WBC 13.7 (*)    RBC 5.25 (*)    Platelets 482 (*)    All other components within normal limits  URINALYSIS, ROUTINE W REFLEX MICROSCOPIC - Abnormal; Notable for the following components:   Color, Urine YELLOW (*)    APPearance CLOUDY (*)    All other components within normal limits  POC URINE PREG, ED - Normal  LIPASE, BLOOD   No results found.  PROCEDURES:  Critical Care performed: No  Procedures  MEDICATIONS ORDERED IN ED: Medications  sodium chloride  0.9 % bolus 1,000 mL (0 mLs Intravenous Stopped 12/12/23 1959)  dicyclomine  (BENTYL ) capsule 10 mg (10 mg Oral Given 12/12/23 1718)  ondansetron  (ZOFRAN ) injection 4 mg (4 mg Intravenous Given 12/12/23 1718)  pantoprazole  (PROTONIX ) injection 40 mg (40 mg Intravenous Given 12/12/23 1718)     IMPRESSION / MDM / ASSESSMENT AND PLAN / ED COURSE  I  reviewed the triage vital signs and the nursing notes.                              Clinical Course as of 12/12/23 2013  Fri Dec 12, 2023  2001 Patient states for the first time since "Sunday she feels hunger.  This is a reassuring sign.  Will have patient discharge and advise follow-up with primary care provider. [MH]    Clinical Course User Index [MH] Shaneese Tait A, PA-C    42 y.o. female presents to the emergency department for evaluation and treatment of nausea and vomiting. See HPI for further details.   Differential diagnosis includes, but is not limited to viral gastroenteritis, electrolyte abnormality, GERD, UTI  Patient's presentation is most consistent with acute complicated illness / injury requiring diagnostic workup.  Patient is alert and oriented.  She is hemodynamic stable.  Physical exam findings are stated above.  Normal abdominal exam with no reproducible tenderness on palpation.  Lab work is reassuring.  Mild leukocytosis 13.7.  CMP glucose 149, alkaline phosphate 133 total protein 8.3 mildly elevated.   Lipase normal.  No indication for further workup with images.  Urinalysis normal.  Plan to administer IV fluids and GI cocktail and reassess.  On reassessment patient reports improvement in symptoms.  She has not vomited during this ED visit.  I do suspect that she is stable for discharge home and outpatient management.  ED return precaution discussed.  FINAL CLINICAL IMPRESSION(S) / ED DIAGNOSES   Final diagnoses:  Nausea vomiting and diarrhea   Rx / DC Orders   ED Discharge Orders          Ordered    ondansetron (ZOFRAN-ODT) 4 MG disintegrating tablet  Every 8 hours PRN        12/12/23 1931    pantoprazole (PROTONIX) 40 MG tablet  BH-each morning        11" /21/25 1931             Note:  This document was prepared using Dragon voice recognition software and may include unintentional dictation errors.    Margrette Monte A, PA-C 12/12/23 2013    Nicholaus Rolland BRAVO, MD 12/12/23 2025

## 2023-12-12 NOTE — Discharge Instructions (Addendum)
 Your evaluated in the ED for nausea and vomiting.  Your physical exam findings are overall reassuring.  Your lab work is reassuring and you are urinalysis is normal.  Please follow-up with your primary care provider in 1 week if symptoms persist.  If any new or worsening symptoms occur please return to ED for further evaluation.

## 2023-12-17 ENCOUNTER — Other Ambulatory Visit: Payer: Self-pay | Admitting: Nurse Practitioner

## 2023-12-17 DIAGNOSIS — F330 Major depressive disorder, recurrent, mild: Secondary | ICD-10-CM

## 2023-12-22 NOTE — Telephone Encounter (Signed)
 Duplicate request,too soon for refill.  Requested Prescriptions  Pending Prescriptions Disp Refills   buPROPion  (WELLBUTRIN  XL) 150 MG 24 hr tablet [Pharmacy Med Name: BUPROPION  XL 150MG  TABLETS (24 H)] 90 tablet 1    Sig: TAKE 1 TABLET(150 MG) BY MOUTH DAILY     Psychiatry: Antidepressants - bupropion  Passed - 12/22/2023 12:02 PM      Passed - Cr in normal range and within 360 days    Creat  Date Value Ref Range Status  07/24/2023 0.79 0.50 - 0.99 mg/dL Final   Creatinine, Ser  Date Value Ref Range Status  12/12/2023 0.95 0.44 - 1.00 mg/dL Final   Creatinine, Urine  Date Value Ref Range Status  07/24/2023 105 20 - 275 mg/dL Final         Passed - AST in normal range and within 360 days    AST  Date Value Ref Range Status  12/12/2023 17 15 - 41 U/L Final         Passed - ALT in normal range and within 360 days    ALT  Date Value Ref Range Status  12/12/2023 17 0 - 44 U/L Final         Passed - Completed PHQ-2 or PHQ-9 in the last 360 days      Passed - Last BP in normal range    BP Readings from Last 1 Encounters:  12/12/23 134/89         Passed - Valid encounter within last 6 months    Recent Outpatient Visits           1 month ago Type 2 diabetes mellitus with hyperglycemia, without long-term current use of insulin  [facility])   [facility] Gareth Mliss FALCON, FNP   5 months ago Type 2 diabetes mellitus with hyperglycemia, without long-term current use of insulin  [facility])   [facility] Gareth Mliss FALCON, FNP   8 months ago Pain of left heel   [facility] Gareth Mliss FALCON, [place]

## 2023-12-24 ENCOUNTER — Encounter: Payer: Self-pay | Admitting: Nurse Practitioner

## 2024-01-02 ENCOUNTER — Encounter

## 2024-01-30 ENCOUNTER — Ambulatory Visit

## 2024-01-30 ENCOUNTER — Encounter: Payer: Self-pay | Admitting: Podiatry

## 2024-01-30 ENCOUNTER — Ambulatory Visit: Admitting: Podiatry

## 2024-01-30 VITALS — Ht 62.0 in | Wt 246.9 lb

## 2024-01-30 DIAGNOSIS — S86012A Strain of left Achilles tendon, initial encounter: Secondary | ICD-10-CM | POA: Diagnosis not present
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
## 2024-02-03 ENCOUNTER — Other Ambulatory Visit: Payer: Self-pay | Admitting: Nurse Practitioner

## 2024-02-03 DIAGNOSIS — F330 Major depressive disorder, recurrent, mild: Secondary | ICD-10-CM

## 2024-02-04 ENCOUNTER — Ambulatory Visit
Admission: RE | Admit: 2024-02-04 | Discharge: 2024-02-04 | Disposition: A | Discharge status: Home / Self Care | Source: Ambulatory Visit | Attending: Nurse Practitioner | Admitting: Nurse Practitioner

## 2024-02-04 DIAGNOSIS — Z1231 Encounter for screening mammogram for malignant neoplasm of breast: Secondary | ICD-10-CM | POA: Insufficient documentation

## 2024-02-04 NOTE — Telephone Encounter (Signed)
 Requested Prescriptions  Pending Prescriptions Disp Refills   buPROPion  (WELLBUTRIN  XL) 150 MG 24 hr tablet [Pharmacy Med Name: BUPROPION  XL 150MG  TABLETS (24 H)] 90 tablet 0    Sig: TAKE 1 TABLET(150 MG) BY MOUTH DAILY     Psychiatry: Antidepressants - bupropion  Passed - 02/04/2024  9:04 AM      Passed - Cr in normal range and within 360 days    Creat  Date Value Ref Range Status  07/24/2023 0.79 0.50 - 0.99 mg/dL Final   Creatinine, Ser  Date Value Ref Range Status  12/12/2023 0.95 0.44 - 1.00 mg/dL Final   Creatinine, Urine  Date Value Ref Range Status  07/24/2023 105 20 - 275 mg/dL Final         Passed - AST in normal range and within 360 days    AST  Date Value Ref Range Status  12/12/2023 17 15 - 41 U/L Final         Passed - ALT in normal range and within 360 days    ALT  Date Value Ref Range Status  12/12/2023 17 0 - 44 U/L Final         Passed - Completed PHQ-2 or PHQ-9 in the last 360 days      Passed - Last BP in normal range    BP Readings from Last 1 Encounters:  12/12/23 134/89         Passed - Valid encounter within last 6 months    Recent Outpatient Visits           3 months ago Type 2 diabetes mellitus with hyperglycemia, without long-term current use of insulin  [facility])   [facility] Gareth Mliss FALCON, FNP   6 months ago Type 2 diabetes mellitus with hyperglycemia, without long-term current use of insulin  [facility])   [facility] Gareth Mliss FALCON, FNP   9 months ago Pain of left heel   [facility] Gareth Mliss FALCON, [place]

## 2024-02-06 ENCOUNTER — Ambulatory Visit: Payer: Self-pay | Admitting: Nurse Practitioner

## 2024-02-06 ENCOUNTER — Other Ambulatory Visit: Payer: Self-pay | Admitting: Nurse Practitioner

## 2024-02-06 DIAGNOSIS — R928 Other abnormal and inconclusive findings on diagnostic imaging of breast: Secondary | ICD-10-CM

## 2024-02-13 ENCOUNTER — Ambulatory Visit
Admission: RE | Admit: 2024-02-13 | Discharge: 2024-02-13 | Disposition: A | Source: Ambulatory Visit | Attending: Nurse Practitioner

## 2024-02-13 DIAGNOSIS — R928 Other abnormal and inconclusive findings on diagnostic imaging of breast: Secondary | ICD-10-CM | POA: Insufficient documentation

## 2024-02-24 ENCOUNTER — Encounter: Payer: Self-pay | Admitting: Nurse Practitioner

## 2024-02-24 ENCOUNTER — Ambulatory Visit: Admitting: Nurse Practitioner

## 2024-02-24 VITALS — BP 138/86 | HR 97 | Temp 98.4°F | Ht 62.0 in | Wt 250.0 lb

## 2024-02-24 DIAGNOSIS — F339 Major depressive disorder, recurrent, unspecified: Secondary | ICD-10-CM

## 2024-02-24 DIAGNOSIS — Z6841 Body Mass Index (BMI) 40.0 and over, adult: Secondary | ICD-10-CM

## 2024-02-24 DIAGNOSIS — F419 Anxiety disorder, unspecified: Secondary | ICD-10-CM

## 2024-02-24 DIAGNOSIS — K219 Gastro-esophageal reflux disease without esophagitis: Secondary | ICD-10-CM

## 2024-02-24 DIAGNOSIS — F330 Major depressive disorder, recurrent, mild: Secondary | ICD-10-CM | POA: Diagnosis not present

## 2024-02-24 DIAGNOSIS — E1165 Type 2 diabetes mellitus with hyperglycemia: Secondary | ICD-10-CM | POA: Diagnosis not present

## 2024-02-24 DIAGNOSIS — Z7984 Long term (current) use of oral hypoglycemic drugs: Secondary | ICD-10-CM

## 2024-02-24 DIAGNOSIS — E782 Mixed hyperlipidemia: Secondary | ICD-10-CM

## 2024-02-24 DIAGNOSIS — Z130 Encounter for screening for diseases of the blood and blood-forming organs and certain disorders involving the immune mechanism: Secondary | ICD-10-CM

## 2024-02-24 MED ORDER — BUPROPION HCL ER (XL) 300 MG PO TB24: 300.0000 mg | ORAL_TABLET | Freq: Every day | ORAL | 0 refills | Status: AC

## 2024-02-24 MED ORDER — CITALOPRAM HYDROBROMIDE 20 MG PO TABS: 20.0000 mg | ORAL_TABLET | Freq: Two times a day (BID) | ORAL | Status: AC

## 2024-02-25 ENCOUNTER — Ambulatory Visit: Payer: Self-pay | Admitting: Nurse Practitioner

## 2024-02-25 LAB — LIPID PANEL
Cholesterol: 184 mg/dL
HDL: 37 mg/dL — ABNORMAL LOW
LDL Cholesterol (Calc): 118 mg/dL — ABNORMAL HIGH
Non-HDL Cholesterol (Calc): 147 mg/dL — ABNORMAL HIGH
Total CHOL/HDL Ratio: 5 (calc) — ABNORMAL HIGH
Triglycerides: 170 mg/dL — ABNORMAL HIGH

## 2024-02-25 LAB — MICROALBUMIN / CREATININE URINE RATIO
Creatinine, Urine: 231 mg/dL (ref 20–275)
Microalb Creat Ratio: 7 mg/g{creat}
Microalb, Ur: 1.6 mg/dL

## 2024-02-25 LAB — CBC WITH DIFFERENTIAL/PLATELET
Absolute Lymphocytes: 3339 {cells}/uL (ref 850–3900)
Absolute Monocytes: 696 {cells}/uL (ref 200–950)
Basophils Absolute: 94 {cells}/uL (ref 0–200)
Basophils Relative: 0.8 %
Eosinophils Absolute: 389 {cells}/uL (ref 15–500)
Eosinophils Relative: 3.3 %
HCT: 38.9 % (ref 35.9–46.0)
Hemoglobin: 12.7 g/dL (ref 11.7–15.5)
MCH: 27.3 pg (ref 27.0–33.0)
MCHC: 32.6 g/dL (ref 31.6–35.4)
MCV: 83.5 fL (ref 81.4–101.7)
MPV: 10.5 fL (ref 7.5–12.5)
Monocytes Relative: 5.9 %
Neutro Abs: 7281 {cells}/uL (ref 1500–7800)
Neutrophils Relative %: 61.7 %
Platelets: 406 10*3/uL — ABNORMAL HIGH (ref 140–400)
RBC: 4.66 Million/uL (ref 3.80–5.10)
RDW: 13.1 % (ref 11.0–15.0)
Total Lymphocyte: 28.3 %
WBC: 11.8 10*3/uL — ABNORMAL HIGH (ref 3.8–10.8)

## 2024-02-25 LAB — COMPREHENSIVE METABOLIC PANEL WITH GFR
AG Ratio: 1.7 (calc) (ref 1.0–2.5)
ALT: 14 U/L (ref 6–29)
AST: 12 U/L (ref 10–30)
Albumin: 4.4 g/dL (ref 3.6–5.1)
Alkaline phosphatase (APISO): 100 U/L (ref 31–125)
BUN: 9 mg/dL (ref 7–25)
CO2: 29 mmol/L (ref 20–32)
Calcium: 9.3 mg/dL (ref 8.6–10.2)
Chloride: 104 mmol/L (ref 98–110)
Creat: 0.83 mg/dL (ref 0.50–0.99)
Globulin: 2.6 g/dL (ref 1.9–3.7)
Glucose, Bld: 117 mg/dL — ABNORMAL HIGH (ref 65–99)
Potassium: 4.6 mmol/L (ref 3.5–5.3)
Sodium: 141 mmol/L (ref 135–146)
Total Bilirubin: 0.4 mg/dL (ref 0.2–1.2)
Total Protein: 7 g/dL (ref 6.1–8.1)
eGFR: 91 mL/min/{1.73_m2}

## 2024-02-25 LAB — TSH: TSH: 0.81 m[IU]/L

## 2024-02-25 LAB — HEMOGLOBIN A1C
Hgb A1c MFr Bld: 6 % — ABNORMAL HIGH
Mean Plasma Glucose: 126 mg/dL
eAG (mmol/L): 7 mmol/L

## 2024-03-23 ENCOUNTER — Ambulatory Visit: Admitting: Nurse Practitioner
